# Patient Record
Sex: Male | Born: 1940 | Race: White | Hispanic: No | Marital: Married | Smoking: Former smoker
Health system: Southern US, Community
[De-identification: ages and names within clinical notes are randomized; demographics above are authoritative.]

## PROBLEM LIST (undated history)

## (undated) DIAGNOSIS — N189 Chronic kidney disease, unspecified: Secondary | ICD-10-CM

## (undated) DIAGNOSIS — I10 Essential (primary) hypertension: Secondary | ICD-10-CM

## (undated) DIAGNOSIS — J309 Allergic rhinitis, unspecified: Secondary | ICD-10-CM

## (undated) DIAGNOSIS — I2510 Atherosclerotic heart disease of native coronary artery without angina pectoris: Secondary | ICD-10-CM

## (undated) DIAGNOSIS — I4891 Unspecified atrial fibrillation: Secondary | ICD-10-CM

## (undated) HISTORY — DX: Unspecified atrial fibrillation: I48.91

## (undated) HISTORY — DX: Atherosclerotic heart disease of native coronary artery without angina pectoris: I25.10

## (undated) HISTORY — PX: AORTIC VALVE REPAIR: SHX6306

## (undated) HISTORY — DX: Essential (primary) hypertension: I10

## (undated) HISTORY — DX: Chronic kidney disease, unspecified: N18.9

## (undated) HISTORY — PX: CORONARY ARTERY BYPASS GRAFT: SHX141

---

## 2002-09-20 ENCOUNTER — Encounter: Admission: RE | Admit: 2002-09-20 | Discharge: 2002-09-20 | Payer: Self-pay | Admitting: Pulmonary Disease

## 2002-09-20 ENCOUNTER — Encounter: Payer: Self-pay | Admitting: Pulmonary Disease

## 2003-06-19 ENCOUNTER — Other Ambulatory Visit: Payer: Self-pay

## 2005-01-10 ENCOUNTER — Other Ambulatory Visit: Payer: Self-pay

## 2005-01-16 ENCOUNTER — Inpatient Hospital Stay: Payer: Self-pay | Admitting: Specialist

## 2005-01-22 ENCOUNTER — Encounter: Payer: Self-pay | Admitting: Internal Medicine

## 2005-01-23 ENCOUNTER — Ambulatory Visit: Payer: Self-pay | Admitting: Internal Medicine

## 2006-04-09 ENCOUNTER — Ambulatory Visit: Payer: Self-pay | Admitting: Specialist

## 2006-07-07 ENCOUNTER — Inpatient Hospital Stay (HOSPITAL_COMMUNITY): Admission: RE | Admit: 2006-07-07 | Discharge: 2006-07-08 | Payer: Self-pay | Admitting: Neurosurgery

## 2007-01-05 ENCOUNTER — Ambulatory Visit: Payer: Self-pay | Admitting: Internal Medicine

## 2007-03-04 ENCOUNTER — Ambulatory Visit: Payer: Self-pay | Admitting: Internal Medicine

## 2008-02-21 ENCOUNTER — Emergency Department: Payer: Self-pay | Admitting: Emergency Medicine

## 2008-02-24 ENCOUNTER — Ambulatory Visit: Payer: Self-pay | Admitting: Unknown Physician Specialty

## 2008-03-22 ENCOUNTER — Ambulatory Visit: Payer: Self-pay | Admitting: Unknown Physician Specialty

## 2008-04-05 ENCOUNTER — Ambulatory Visit: Payer: Self-pay | Admitting: Unknown Physician Specialty

## 2008-09-02 ENCOUNTER — Ambulatory Visit: Payer: Self-pay | Admitting: General Surgery

## 2009-02-19 ENCOUNTER — Ambulatory Visit: Payer: Self-pay | Admitting: Specialist

## 2009-05-29 ENCOUNTER — Ambulatory Visit: Payer: Self-pay | Admitting: Internal Medicine

## 2009-06-13 ENCOUNTER — Ambulatory Visit: Payer: Self-pay | Admitting: Specialist

## 2009-06-22 ENCOUNTER — Ambulatory Visit: Payer: Self-pay | Admitting: Specialist

## 2010-05-22 NOTE — Op Note (Signed)
NAME:  Terry Stephens, Terry Stephens NO.:  [id number]   MEDICAL RECORD NO.:  [id number]          PATIENT TYPE:  INP   LOCATION:  2899                         FACILITY:  [facility]   PHYSICIAN:  Kathaleen Maser. Pool, M.D.    DATE OF BIRTH:  1940/10/06   DATE OF PROCEDURE:  07/07/2006  DATE OF DISCHARGE:                               OPERATIVE REPORT   SERVICE:  Neurosurgery.   PREOPERATIVE DIAGNOSIS:  Right L5-S1 herniated pulposus with __________.   POSTOPERATIVE DIAGNOSIS:  Right L5-S1 herniated pulposus with  __________.   PROCEDURE NOTE:  Right L5-S1 laminotomy and microdiskectomy.   SURGEON:  Kathaleen Maser. Pool, M.D.   ASSISTANT:  Reinaldo Meeker, M.D.   ANESTHESIA:  General   PREOPERATIVE MEDICATION:  Terry Stephens is a 70 year old male with  history of a right-sided foot drop with paresthesias and numbness  consistent with a right-sided L5 radiculopathy.  Workup demonstrates  evidence of a foraminal disk herniation off to the right-side at L5-S1  with compression of the right-sided L5 nerve root.  The patient has  failed conservative management.  We discussed options of further  management including the possibility of undergoing a right-sided L5-S1  laminotomy via microscope in the hopes of improving his symptoms.  The  patient aware of the risks and benefits and wishes to proceed.   Operative note, the patient placed on the operating table in the supine  position after general anesthesia was achieved.  The patient was placed  prone onto the Wilson frame, properly padded the patient in the lumbar  regions and prepped and draped sterilely; a second incision overlying  the L5-S1 interspace.  This was carried sharply in the midline.  A  subperiosteal dissection performed to the lamina facet joints of L5-S1  on the right side.  A deep self-retainer is placed.  Intraoperative x-  rays taken and level was confirmed.   Laminotomy was then performed using high-speed drill and  Kerrison  rongeurs to the inferior aspect of the lamina of L5, medial aspect of L5-  S1 facet joint, and the superior aspect of the S1 __________ .  Ligament  flavum was then elevated and resected the fascia using Kerrison  rongeurs.  Underlying thecal sac was then identified.  The facet joints  and ligament and flavum were further undercut exposing the right side L5  nerve root.  Microscope was then brought into the field using  microdissection of the right side of L5 nerve root, and underlying disk  __________ .  Epidural venous plexus coagulated and cut.  Disk space  then incised with a 15-blade in a rectangular fashion.  A wide displaced  plane was then achieved using two pituitary rongeurs, self-retaining  rongeurs and Epstein curettes.  The disk herniation was completely  resected.  All loose or obviously degenerative disk material was removed  from the interspace.   At this point a very thorough decompression then performed.  Wound was  then irrigated with antibiotic solution.  Gelfoam was placed on the  operative incisions which were found to be good.  Microscope and  retractor system were  removed.  Hemostasis and __________ ; wound was closed in layers with  Vicryl suture.  Steri-Strips and a sterile dressing were applied.  There  were no operative complications.  The patient tolerated the procedure  well; and he returns to the recovery room postoperatively.           ______________________________  Kathaleen Maser Pool, M.D.     HAP/MEDQ  D:  07/07/2006  T:  07/07/2006  Job:  [id number]

## 2010-06-07 ENCOUNTER — Ambulatory Visit: Payer: Self-pay | Admitting: Internal Medicine

## 2010-10-17 DIAGNOSIS — I10 Essential (primary) hypertension: Secondary | ICD-10-CM | POA: Insufficient documentation

## 2010-10-17 DIAGNOSIS — N184 Chronic kidney disease, stage 4 (severe): Secondary | ICD-10-CM | POA: Insufficient documentation

## 2010-10-24 LAB — BASIC METABOLIC PANEL
BUN: 18
GFR calc Af Amer: 51 — ABNORMAL LOW
GFR calc non Af Amer: 43 — ABNORMAL LOW
Glucose, Bld: 85
Potassium: 4
Sodium: 141

## 2010-10-24 LAB — DIFFERENTIAL
Eosinophils Absolute: 0.2
Eosinophils Relative: 3
Lymphs Abs: 1.7
Monocytes Absolute: 0.8 — ABNORMAL HIGH

## 2010-10-24 LAB — CBC
HCT: 44.2
Hemoglobin: 15
MCHC: 33.8
RBC: 4.96
RDW: 14.2 — ABNORMAL HIGH
WBC: 5.9

## 2010-10-24 LAB — TYPE AND SCREEN
ABO/RH(D): O POS
Antibody Screen: NEGATIVE

## 2010-10-24 LAB — ABO/RH: ABO/RH(D): O POS

## 2010-11-04 ENCOUNTER — Emergency Department: Payer: Self-pay | Admitting: Emergency Medicine

## 2013-02-28 IMAGING — CT CT HEAD WITHOUT CONTRAST
2 series · 16 of 30 positions shown, 20 images · non-contrast
Comparison: none

REASON FOR EXAM: syncope
COMMENTS:

PROCEDURE:     CT  - CT HEAD WITHOUT CONTRAST  - November 04, 2010  [DATE]
RESULT:     History: Syncope.
Comparison Study: No prior.

[Series 2: without · axial · non-contrast · 0.46mm/px · z∈[-195,-65]mm · 13 of 32 slices shown, 17 images]
[im 3/32  brain]
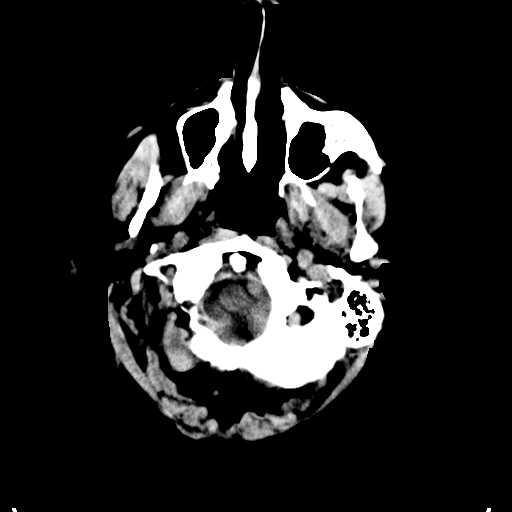
[im 3/32  bone]
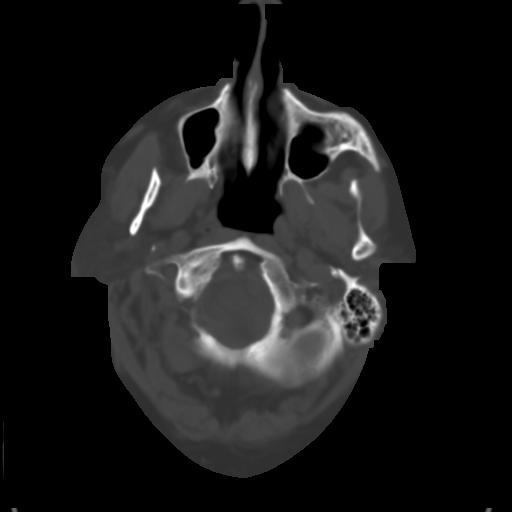
[im 5/32  brain]
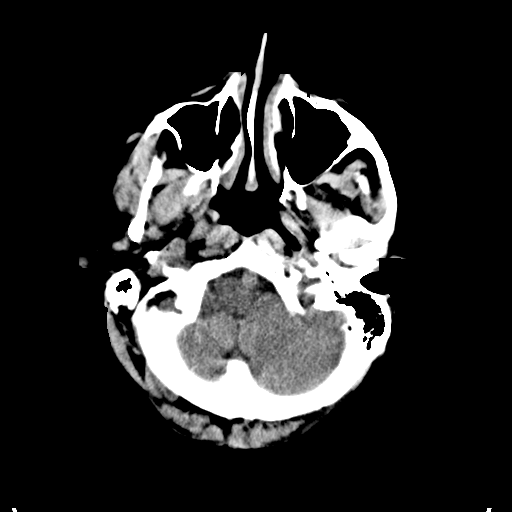
[im 7/32  brain]
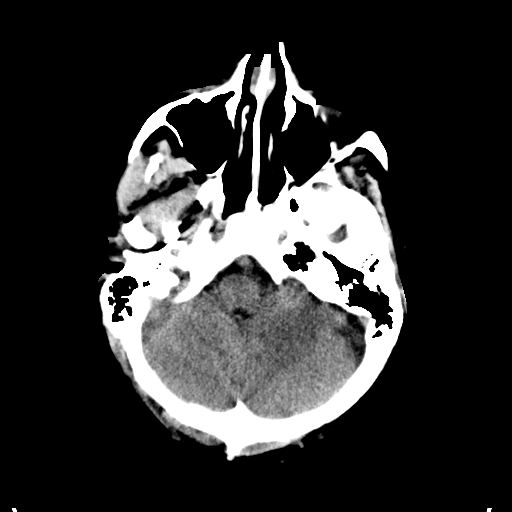
[im 9/32  brain]
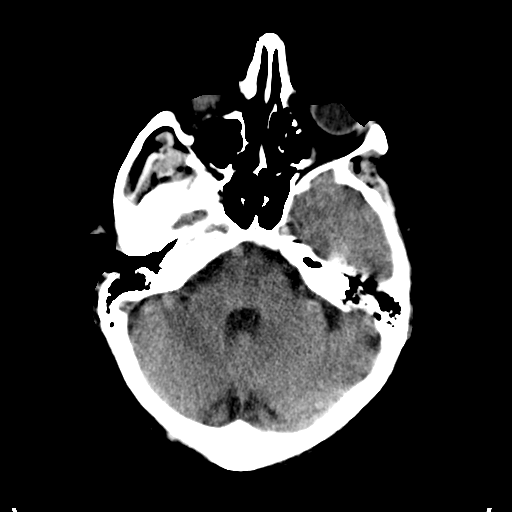
[im 12/32  brain]
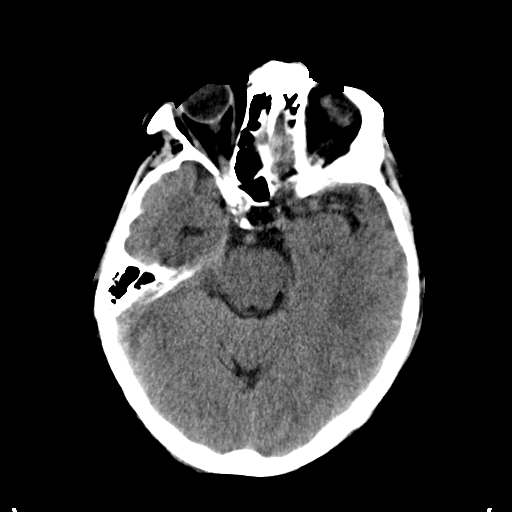
[im 12/32  bone]
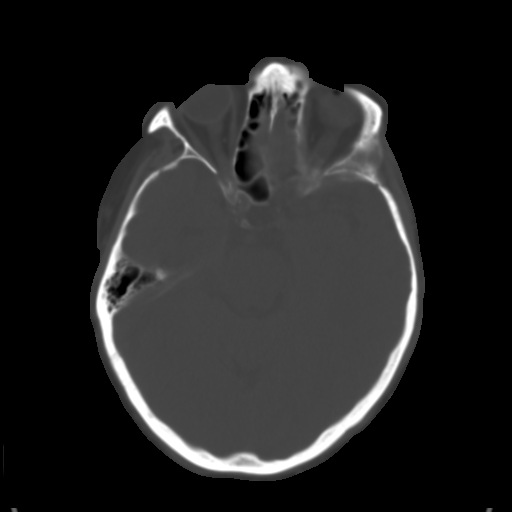
[im 14/32  brain]
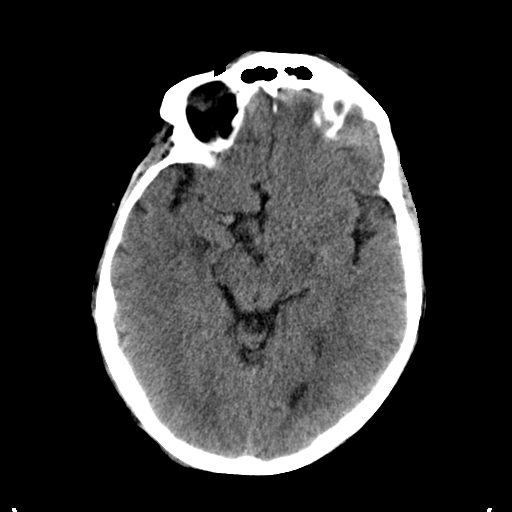
[im 16/32  brain]
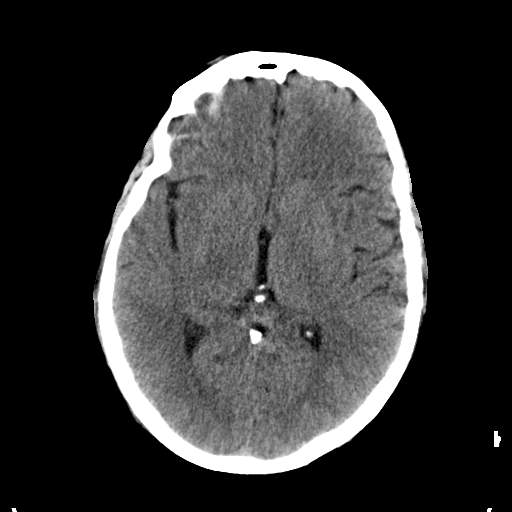
[im 18/32  brain]
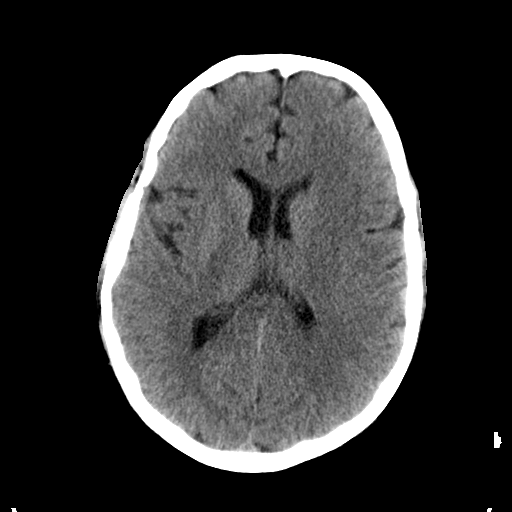
[im 20/32  brain]
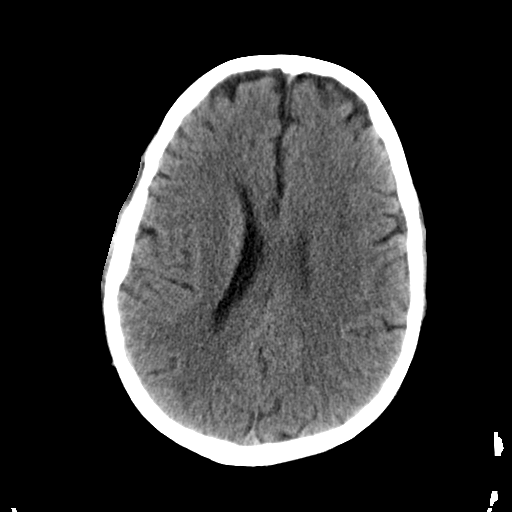
[im 20/32  bone]
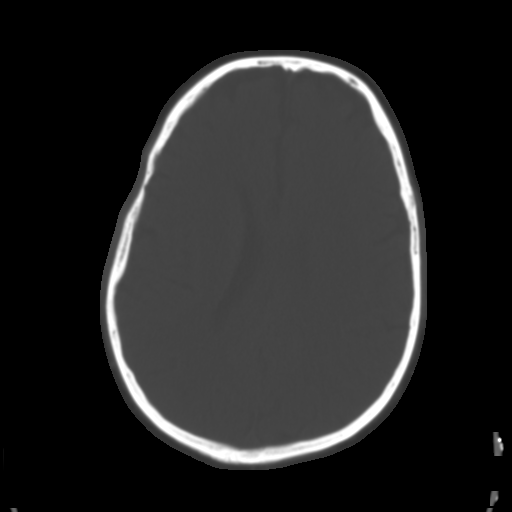
[im 23/32  brain]
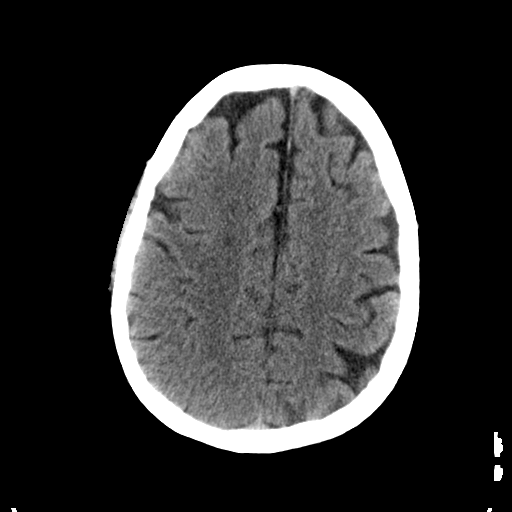
[im 25/32  brain]
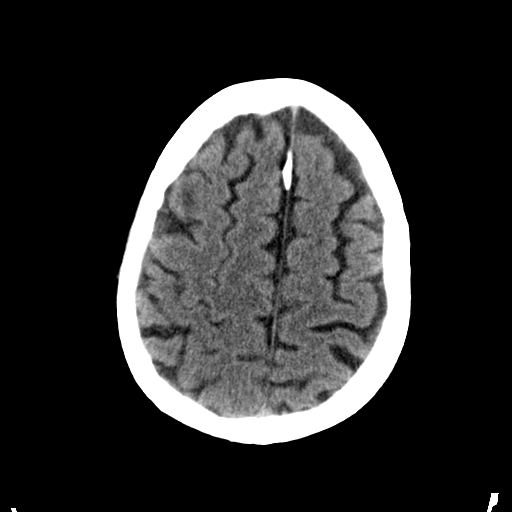
[im 27/32  brain]
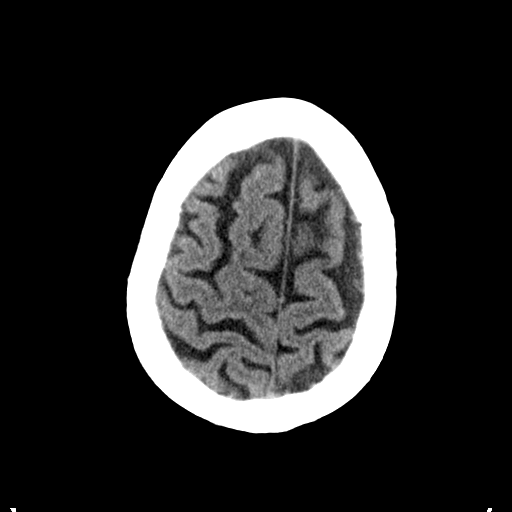
[im 29/32  brain]
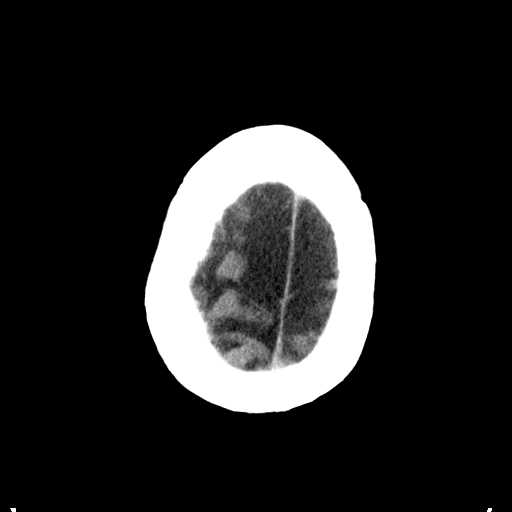
[im 29/32  bone]
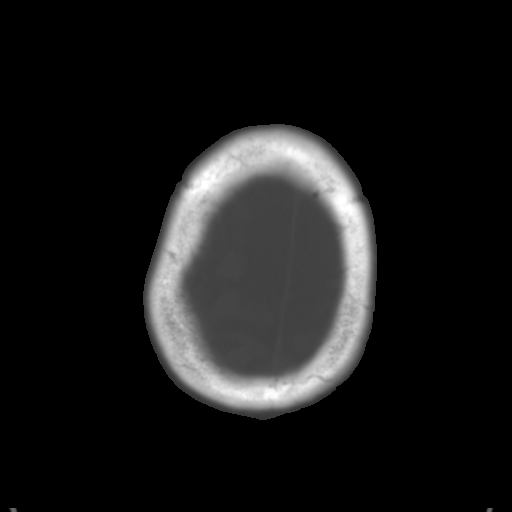

[Series 3: bone · axial · 0.46mm/px · z∈[-195,-150]mm · 3 of 32 slices shown]
[im 3/32  bone]
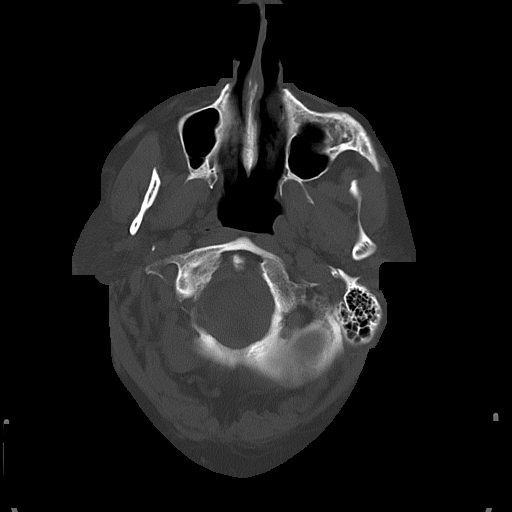
[im 7/32  bone]
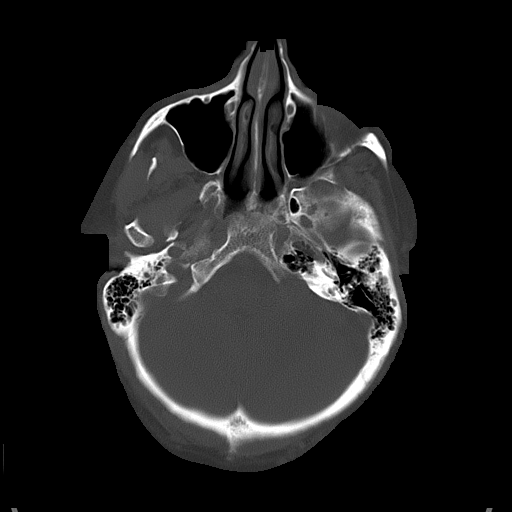
[im 12/32  bone]
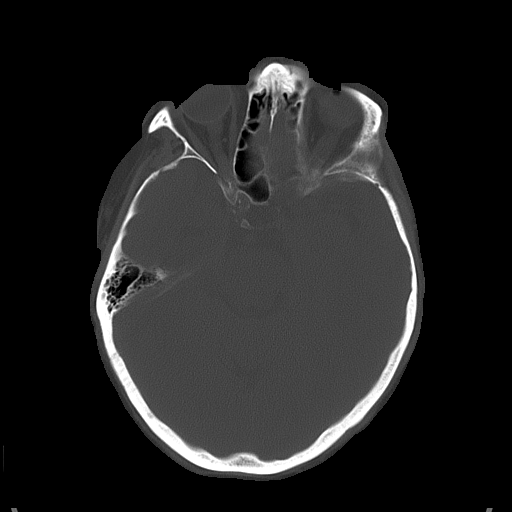

[16 of 30 positions shown; findings below may reference images not displayed]

FINDINGS: No mass. No hydrocephalus. No hemorrhage. White matter changes
noted consistent with chronic ischemia. No bony abnormality identified.
IMPRESSION: No acute abnormality.

## 2016-02-19 DIAGNOSIS — M25569 Pain in unspecified knee: Secondary | ICD-10-CM | POA: Insufficient documentation

## 2016-10-09 ENCOUNTER — Ambulatory Visit (INDEPENDENT_AMBULATORY_CARE_PROVIDER_SITE_OTHER): Payer: Medicare Other | Admitting: Podiatry

## 2016-10-09 ENCOUNTER — Encounter: Payer: Self-pay | Admitting: Podiatry

## 2016-10-09 VITALS — BP 141/88 | HR 74 | Temp 98.5°F | Resp 16

## 2016-10-09 DIAGNOSIS — L600 Ingrowing nail: Secondary | ICD-10-CM | POA: Diagnosis not present

## 2016-10-09 MED ORDER — NEOMYCIN-POLYMYXIN-HC 3.5-10000-1 OT SOLN: OTIC | 0 refills | Status: DC

## 2016-10-09 NOTE — Patient Instructions (Addendum)

## 2016-10-09 NOTE — Progress Notes (Signed)
Subjective:  Patient ID: Terry Malkin Sr., male    DOB: [birth year],  MRN: [id number] HPI No chief complaint on file.   76 y.o. male presents with the above complaint.   He presents today with a two-month history of pain to the  No past medical history on file. No past surgical history on file.  Current Outpatient Prescriptions:  .  Acetaminophen 500 MG coapsule, 500 mg  prn as needed, Disp: , Rfl:  .  aspirin 81 MG tablet, Take by mouth., Disp: , Rfl:  .  atorvastatin (LIPITOR) 20 MG tablet, Take by mouth., Disp: , Rfl:  .  fluticasone (FLONASE) 50 MCG/ACT nasal spray, Place into the nose., Disp: , Rfl:  .  furosemide (LASIX) 40 MG tablet, Take by mouth., Disp: , Rfl:  .  losartan (COZAAR) 50 MG tablet, Take by mouth., Disp: , Rfl:  .  tacrolimus (PROTOPIC) 0.1 % ointment, Apply topically., Disp: , Rfl:  .  amoxicillin (AMOXIL) 500 MG capsule, amoxicillin 500 mg capsule  TAKE 4 CAPSULES BY MOUTH 1 HOUR PRIOR TO APPT, Disp: , Rfl:  .  benzonatate (TESSALON) 100 MG capsule, benzonatate 100 mg capsule  TAKE 1 CAPSULE BY MOUTH EVERY 8 HOURS AS NEEDED FOR COUGH, Disp: , Rfl:  .  carvedilol (COREG) 12.5 MG tablet, Take 12.5 mg by mouth 2 (two) times daily with a meal., Disp: , Rfl: 11 .  Cholecalciferol (VITAMIN D3) 2000 units capsule, Take by mouth., Disp: , Rfl:  .  metroNIDAZOLE (FLAGYL) 500 MG tablet, metronidazole 500 mg tablet  TAKE 1 TABLET BY MOUTH TWICE A DAY AT 12 NOON AND 12 AM, Disp: , Rfl:  .  psyllium (METAMUCIL) 0.52 g capsule, Metamucil, Disp: , Rfl:   Allergies  Allergen Reactions  . Prochlorperazine Edisylate    Review of Systems  All other systems reviewed and are negative.  Objective:   Vitals:   10/09/16 1015  Resp: 16  Temp: 98.5 F (36.9 C)    General: Well developed, nourished, in no acute distress, alert and oriented x3   Dermatological: Skin is warm, dry and supple bilateral. Nails x 10 are well maintained; remaining integument appears  unremarkable at this time. There are no open sores, no preulcerative lesions, no rash or signs of infection present. Sharply incurvated nail margin along the fibular border hallux left mild erythema no purulence or malodor.  Vascular: Dorsalis Pedis artery and Posterior Tibial artery pedal pulses are 2/4 bilateral with immedate capillary fill time. Pedal hair growth present. No varicosities and no lower extremity edema present bilateral.   Neruologic: Grossly intact via light touch bilateral. Vibratory intact via tuning fork bilateral. Protective threshold with Semmes Wienstein monofilament intact to all pedal sites bilateral. Patellar and Achilles deep tendon reflexes 2+ bilateral. No Babinski or clonus noted bilateral.   Musculoskeletal: No gross boney pedal deformities bilateral. No pain, crepitus, or limitation noted with foot and ankle range of motion bilateral. Muscular strength 5/5 in all groups tested bilateral.  Gait: Unassisted, Nonantalgic.    Radiographs:  None taken  Assessment & Plan:   Assessment: Ingrown nail hallux left.  Plan: Perform chemical matrixectomy fibular border hallux left today. He tolerated the procedure well after local anesthesia was administered. He was provided with both oral and written home-going instructions for care and soaking of his toe. We also provided him with a prescription for Cortisporin Otic to be applied twice daily after soaking. We will follow-up with him in 2 weeks  Garrel Ridgel, DPM

## 2016-10-23 ENCOUNTER — Ambulatory Visit (INDEPENDENT_AMBULATORY_CARE_PROVIDER_SITE_OTHER): Payer: Medicare Other | Admitting: Podiatry

## 2016-10-23 ENCOUNTER — Encounter: Payer: Self-pay | Admitting: Podiatry

## 2016-10-23 DIAGNOSIS — L600 Ingrowing nail: Secondary | ICD-10-CM

## 2016-10-23 NOTE — Patient Instructions (Signed)

## 2016-10-23 NOTE — Progress Notes (Signed)
He presents today for matrixectomy hallux left. States is doing just fine.  Objective: Vital signs are stable alert and oriented 3. No erythema edema cellulitis drainage or odor.  Assessment: Healing surgical foot hallux left.  Plan: Recommend he continue to soak it out of an abundance of caution for the next week or so one time daily cover with a Band-Aid during the daytime and leave open at night time. He will soak in Epsom salts and warm water.

## 2020-06-06 ENCOUNTER — Ambulatory Visit
Admission: RE | Admit: 2020-06-06 | Discharge: 2020-06-06 | Disposition: A | Discharge status: Home / Self Care | Payer: Medicare Other | Attending: Internal Medicine | Admitting: Internal Medicine

## 2020-06-06 ENCOUNTER — Other Ambulatory Visit: Payer: Self-pay

## 2020-06-06 ENCOUNTER — Other Ambulatory Visit: Payer: Self-pay | Admitting: Internal Medicine

## 2020-06-06 ENCOUNTER — Ambulatory Visit
Admission: RE | Admit: 2020-06-06 | Discharge: 2020-06-06 | Disposition: A | Discharge status: Home / Self Care | Payer: Medicare Other | Source: Ambulatory Visit | Attending: Internal Medicine | Admitting: Internal Medicine

## 2020-06-06 DIAGNOSIS — R059 Cough, unspecified: Secondary | ICD-10-CM | POA: Diagnosis not present

## 2022-02-07 HISTORY — PX: AV FISTULA PLACEMENT: SHX1204

## 2022-03-20 ENCOUNTER — Other Ambulatory Visit: Payer: Self-pay

## 2022-03-20 ENCOUNTER — Emergency Department
Admission: EM | Admit: 2022-03-20 | Discharge: 2022-03-20 | Disposition: A | Discharge status: Home / Self Care | Payer: Medicare Other | Attending: Emergency Medicine | Admitting: Emergency Medicine

## 2022-03-20 ENCOUNTER — Emergency Department: Payer: Medicare Other

## 2022-03-20 DIAGNOSIS — R600 Localized edema: Secondary | ICD-10-CM

## 2022-03-20 DIAGNOSIS — N189 Chronic kidney disease, unspecified: Secondary | ICD-10-CM | POA: Insufficient documentation

## 2022-03-20 LAB — TROPONIN I (HIGH SENSITIVITY)
Troponin I (High Sensitivity): 13 ng/L (ref <18)
Troponin I (High Sensitivity): 13 ng/L (ref <18)

## 2022-03-20 LAB — CBC
HCT: 32.7 % — ABNORMAL LOW (ref 39.0–52.0)
Hemoglobin: 10.1 g/dL — ABNORMAL LOW (ref 13.0–17.0)
MCH: 28.9 pg (ref 26.0–34.0)
MCHC: 30.9 g/dL (ref 30.0–36.0)
MCV: 93.7 fL (ref 80.0–100.0)
Platelets: 234 10*3/uL (ref 150–400)
RBC: 3.49 MIL/uL — ABNORMAL LOW (ref 4.22–5.81)
RDW: 14.4 % (ref 11.5–15.5)
WBC: 5 10*3/uL (ref 4.0–10.5)
nRBC: 0 % (ref 0.0–0.2)

## 2022-03-20 LAB — COMPREHENSIVE METABOLIC PANEL
ALT: 19 U/L (ref 0–44)
AST: 15 U/L (ref 15–41)
Albumin: 3.2 g/dL — ABNORMAL LOW (ref 3.5–5.0)
Alkaline Phosphatase: 67 U/L (ref 38–126)
Anion gap: 7 (ref 5–15)
BUN: 90 mg/dL — ABNORMAL HIGH (ref 8–23)
CO2: 21 mmol/L — ABNORMAL LOW (ref 22–32)
Calcium: 8.5 mg/dL — ABNORMAL LOW (ref 8.9–10.3)
Chloride: 111 mmol/L (ref 98–111)
Creatinine, Ser: 4.68 mg/dL — ABNORMAL HIGH (ref 0.61–1.24)
GFR, Estimated: 12 mL/min — ABNORMAL LOW (ref >60)
Glucose, Bld: 138 mg/dL — ABNORMAL HIGH (ref 70–99)
Potassium: 4.5 mmol/L (ref 3.5–5.1)
Sodium: 139 mmol/L (ref 135–145)
Total Bilirubin: 0.5 mg/dL (ref 0.3–1.2)
Total Protein: 5.8 g/dL — ABNORMAL LOW (ref 6.5–8.1)

## 2022-03-20 LAB — BRAIN NATRIURETIC PEPTIDE: B Natriuretic Peptide: 749.2 pg/mL — ABNORMAL HIGH (ref 0.0–100.0)

## 2022-03-20 MED ORDER — FUROSEMIDE 80 MG PO TABS: 80.0000 mg | ORAL_TABLET | Freq: Two times a day (BID) | ORAL | 0 refills | Status: DC

## 2022-03-20 NOTE — ED Provider Notes (Signed)
[facility] Provider Note   Event Date/Time   First MD Initiated Contact with Patient 03/20/22 1526     (approximate) History  Leg Swelling  HPI Terry Stephens Sr. is a 82 y.o. male with a stated past medical history of chronic renal disease recently with AV fistula placed on 03/15/2022 who presents complaining of bilateral lower extremity edema that has been worsening despite increasing Lasix dosing.  Patient also endorses exercise intolerance with dyspnea on exertion over this time as well. ROS: Patient currently denies any vision changes, tinnitus, difficulty speaking, facial droop, sore throat, chest pain, abdominal pain, nausea/vomiting/diarrhea, dysuria, or numbness/paresthesias in any extremity   Physical Exam  Triage Vital Signs: ED Triage Vitals  Enc Vitals Group     BP 03/20/22 1207 125/71     Pulse Rate 03/20/22 1207 63     Resp 03/20/22 1207 16     Temp 03/20/22 1207 97.7 F (36.5 C)     Temp src --      SpO2 03/20/22 1207 100 %     Weight 03/20/22 1215 199 lb (90.3 kg)     Height 03/20/22 1215 '5\' 9"'$  (1.753 m)     Head Circumference --      Peak Flow --      Pain Score 03/20/22 1221 0     Pain Loc --      Pain Edu? --      Excl. in [place]? --    Most recent vital signs: Vitals:   03/20/22 1600 03/20/22 1630  BP: (!) 150/84 (!) 142/92  Pulse: 85 89  Resp: 16 17  Temp:    SpO2: 99% 98%   General: Awake, oriented x4. CV:  Good peripheral perfusion.  Resp:  Normal effort.  Abd:  No distention.  Other:  Elderly overweight Caucasian male laying in bed in no acute distress.  2+ pitting edema to bilateral lower extremities ED Results / Procedures / Treatments  Labs (all labs ordered are listed, but only abnormal results are displayed) Labs Reviewed  CBC - Abnormal; Notable for the following components:      Result Value   RBC 3.49 (*)    Hemoglobin 10.1 (*)    HCT 32.7 (*)    All other components within normal limits  COMPREHENSIVE  METABOLIC PANEL - Abnormal; Notable for the following components:   CO2 21 (*)    Glucose, Bld 138 (*)    BUN 90 (*)    Creatinine, Ser 4.68 (*)    Calcium 8.5 (*)    Total Protein 5.8 (*)    Albumin 3.2 (*)    GFR, Estimated 12 (*)    All other components within normal limits  BRAIN NATRIURETIC PEPTIDE - Abnormal; Notable for the following components:   B Natriuretic Peptide 749.2 (*)    All other components within normal limits  TROPONIN I (HIGH SENSITIVITY)  TROPONIN I (HIGH SENSITIVITY)   EKG ED ECG REPORT I, Naaman Plummer, the attending physician, personally viewed and interpreted this ECG. Date: 03/20/2022 EKG Time: 1234 Rate: 67 Rhythm: Atrial fibrillation QRS Axis: normal Intervals: normal ST/T Wave abnormalities: normal Narrative Interpretation: Atrial fibrillation.  No evidence of acute ischemia RADIOLOGY ED MD interpretation: One-view portable chest x-ray interpreted by me shows no evidence of pneumonia, pneumothorax, or widened mediastinum -Agree with radiology assessment.  There is evidence of possible small bilateral pleural effusions with no edema or confluent airspace opacities.  There is mildly increased bibasilar atelectasis  Official radiology report(s): DG Chest Port 1 View  Result Date: 03/20/2022 CLINICAL DATA:  Shortness of breath with leg swelling for the past week. EXAM: PORTABLE CHEST 1 VIEW COMPARISON:  Radiographs 06/06/2020 and 11/04/2010.  CT 01/23/2005. FINDINGS: 1325 hours. 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 Mildly increased linear opacities at both lung bases, likely reflecting atelectasis or scarring. There may be a small amount of pleural fluid bilaterally. No edema, confluent airspace opacity or pneumothorax. Thoracotomy changes are present on the right. No acute osseous findings are seen. IMPRESSION: Mildly increased bibasilar atelectasis or scarring. Possible small bilateral pleural effusions. No edema or  confluent airspace opacity. Electronically Signed   By: Richardean Sale M.D.   On: 03/20/2022 13:38   PROCEDURES: Critical Care performed: No .1-3 Lead EKG Interpretation  Performed by: Naaman Plummer, MD Authorized by: Naaman Plummer, MD     Interpretation: normal     ECG rate:  71   ECG rate assessment: normal     Rhythm: sinus rhythm     Ectopy: none     Conduction: normal    MEDICATIONS ORDERED IN ED: Medications - No data to display IMPRESSION / MDM / [place] / ED COURSE  I reviewed the triage vital signs and the nursing notes.                             The patient is on the cardiac monitor to evaluate for evidence of arrhythmia and/or significant heart rate changes. Patient's presentation is most consistent with acute presentation with potential threat to life or bodily function. Endorses dyspnea, endorses LE edema Denies Non adherence to medication regimen  Workup: ECG, CBC, BMP, Troponin, BNP, CXR Findings: EKG: No STEMI and no evidence of Brugadas sign, delta wave, epsilon wave, significantly prolonged QTc, or malignant arrhythmia. 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 Low suspicion for PNA, ACS, tamponade, aortic dissection. Interventions: Diuresis  Reassessment: Symptoms improved in ED with diuresis  Disposition (Stable): Discharge    FINAL CLINICAL IMPRESSION(S) / ED DIAGNOSES   Final diagnoses:  Bilateral lower extremity edema   Rx / DC Orders   ED Discharge Orders          Ordered    furosemide (LASIX) 80 MG tablet  2 times daily        03/20/22 1650           Note:  This document was prepared using Dragon voice recognition software and may include unintentional dictation errors.   Naaman Plummer, MD 03/20/22 [phone]

## 2022-03-20 NOTE — ED Notes (Signed)
[facility]  transfer  center  called per  Dr  Cheri Fowler MD

## 2022-03-20 NOTE — ED Triage Notes (Addendum)
Pt states that his legs and ankles have been swelling for the past week, pt states that he has doubled his furosemide for the past 4 days taking '40mg'$  a day instead of '20mg'$ . Pt states that he hasn't noticed a difference, states that the left is worse than the right. Reports increased sob since the 10th of feb. 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

## 2022-03-20 NOTE — ED Notes (Signed)
Pt verbalizes understanding of discharge instructions. Opportunity for questioning and answers were provided. Pt discharged from ED to home with significant other.   ? ?

## 2022-03-27 ENCOUNTER — Other Ambulatory Visit (INDEPENDENT_AMBULATORY_CARE_PROVIDER_SITE_OTHER): Payer: Self-pay | Admitting: Physician Assistant

## 2022-03-27 DIAGNOSIS — N186 End stage renal disease: Secondary | ICD-10-CM

## 2022-03-27 DIAGNOSIS — Z9889 Other specified postprocedural states: Secondary | ICD-10-CM

## 2022-04-01 ENCOUNTER — Ambulatory Visit (INDEPENDENT_AMBULATORY_CARE_PROVIDER_SITE_OTHER): Payer: Medicare Other

## 2022-04-01 DIAGNOSIS — Z9889 Other specified postprocedural states: Secondary | ICD-10-CM

## 2022-04-01 DIAGNOSIS — N186 End stage renal disease: Secondary | ICD-10-CM | POA: Diagnosis not present

## 2022-05-21 ENCOUNTER — Encounter: Payer: Self-pay | Admitting: *Deleted

## 2022-05-28 NOTE — Progress Notes (Unsigned)
Cardiology Office Note  Date:  05/29/2022   ID:  Terry Malkin Sr., DOB 06-14-1940, MRN [id number]  PCP:  Terry Shaggy, MD   Chief Complaint  Patient presents with   Establish Care    Afib, CKD, CAD (CABG), HTN,     HPI:  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 Aortic Insufficiency: S/P AVR / S/P AVR (Bioprosthetic) 2012 CKD- Stage 5 / ESRD / Anemia of chronic disease Hyperlipidemia: Statin History of vasovagal syncope related to blood draws Who presents to establish care for his coronary disease, atrial fibrillation  Previously [facility] cardiology EP, difficulty [facility] to establish locally in [place]  Currently lives in a condo with his wife, hobbies include some gardening, reading, house activities No regular exercise program Denies any significant tachypalpitations concerning for arrhythmia, Denies orthostasis symptoms Recent AV graft fistula placed on left arm Not receiving hemodialysis but is prepared if needed No chest pain or shortness of breath concerning for angina  Bursitis in heel, [facility] Started on prednisone  Labs reviewed CR 5.5   Cardiac imaging/testing as detailed below Echocardiogram: 10/14/21 MILD LV DYSFUNCTION (See above) WITH MILD LVH MILD RV SYSTOLIC DYSFUNCTION (See above) VALVULAR REGURGITATION: TRIVIAL AR, MILD MR, MILD PR,  MODERATE TR PROSTHETIC VALVE(S): BIOPROSTHETIC AoV EF 45% LA DIAM 4.2 CM  Echo 2/24 Normal left ventricular size with reduced systolic function. LVEF 40%.  Enlarged right ventricular size with reduced function.  Normal function bioprosthetic aortic valve  Mild mitral  regurgitation.   3-Day Holter Monitor: 04/24/21 This 48 hour Holter scan was adequate for interpretation. The patient was in sinus rhythm, sinus bradycardia, with first degree AV block and bundle branch noted throughout (strip 12) Ventricular ectopic activity consisted of multifocal PVCs (strips 6,10), couplets (strip 13), triplet (strip 9) Unable to accurately quantitate supraventricular ectopy due to program setting, however there were PACs (strip 8), atrial pairs (strip 5), bigeminy (strip 7), trigeminy (strip 11) with runs of SVT. The longest was a 37 beat, 91 BPM atrial run at 4:34:15 AM (2) (strips 15,16). The fastest run was 3 beats, 131 BPM at 4:50:40 PM (2) (strip 14) There were no pauses greater than 2.0 seconds noted There were no symptoms noted in diary nor was the event button activated.  EKG personally reviewed by myself on todays visit Normal sinus rhythm with rate 68 bpm right bundle branch block consider old inferior MI   PMH:   has a past medical history of A-fib (HCC), CAD (coronary artery disease), Chronic kidney disease, and Hypertension.  PSH:    Past Surgical History:  Procedure Laterality Date   AORTIC VALVE REPAIR     CORONARY ARTERY BYPASS GRAFT      Current Outpatient Medications  Medication Sig Dispense Refill   Acetaminophen 500 MG coapsule 500 mg  prn as needed     benzonatate (TESSALON) 100 MG capsule benzonatate 100 mg capsule  TAKE 1 CAPSULE BY MOUTH EVERY 8 HOURS AS NEEDED FOR COUGH     Cholecalciferol (VITAMIN D3) 2000 units capsule  Take by mouth.     diclofenac Sodium (VOLTAREN) 1 % GEL Apply topically 4 (four) times daily as needed.     esomeprazole (NEXIUM) 40 MG capsule Take  40 mg by mouth as needed.     fluticasone (FLONASE) 50 MCG/ACT nasal spray Place into the nose.     methylPREDNISolone (MEDROL) 4 MG tablet Take 4 mg by mouth daily.     metroNIDAZOLE (FLAGYL) 500 MG tablet metronidazole 500 mg tablet  TAKE 1 TABLET BY MOUTH TWICE A DAY AT 12 NOON AND 12 AM     neomycin-polymyxin-hydrocortisone (CORTISPORIN) OTIC solution Apply one to two drops to toe after soaking twice daily. 10 mL 0   psyllium (METAMUCIL) 0.52 g capsule Metamucil     Saw Palmetto 450 MG CAPS Take 450 mg by mouth daily.     sodium bicarbonate 650 MG tablet Take 650 mg by mouth 2 (two) times daily.     tacrolimus (PROTOPIC) 0.1 % ointment Apply topically.     tamsulosin (FLOMAX) 0.4 MG CAPS capsule Take 0.4 mg by mouth daily after supper.     amoxicillin (AMOXIL) 500 MG capsule amoxicillin 500 mg capsule  TAKE 4 CAPSULES BY MOUTH 1 HOUR PRIOR TO APPT (Patient not taking: Reported on 05/29/2022)     atorvastatin (LIPITOR) 20 MG tablet Take 1 tablet (20 mg total) by mouth daily. 90 tablet 3   ELIQUIS 2.5 MG TABS tablet Take 1 tablet (2.5 mg total) by mouth 2 (two) times daily. 180 tablet 3   furosemide (LASIX) 40 MG tablet Take 1 tablet (40 mg total) by mouth 2 (two) times daily. 180 tablet 3   losartan (COZAAR) 50 MG tablet Take 1 tablet (50 mg total) by mouth daily. 90 tablet 3   metoprolol succinate (TOPROL-XL) 100 MG 24 hr tablet Take 1 tablet (100 mg total) by mouth 2 (two) times daily. Take with or immediately following a meal. 180 tablet 3   No current facility-administered medications for this visit.     Allergies:   Phenothiazines and Prochlorperazine edisylate   Social History:  The patient  reports that he has quit smoking. He has never used smokeless tobacco. He reports that he does not drink alcohol and does not use drugs.   Family History:   family history includes Heart attack in his brother, father, and sister.    Review of Systems: Review of Systems  Constitutional:  Negative.   HENT: Negative.    Respiratory: Negative.    Cardiovascular: Negative.   Gastrointestinal: Negative.   Musculoskeletal: Negative.   Neurological: Negative.   Psychiatric/Behavioral: Negative.    All other systems reviewed and are negative.   PHYSICAL EXAM: VS:  BP 125/65 (BP Location: Right Arm, Patient Position: Sitting)   Pulse 68   Ht 5\' 9"  (1.753 m)   Wt 188 lb 9.6 oz (85.5 kg)   SpO2 98%   BMI 27.85 kg/m  , BMI Body mass index is 27.85 kg/m. GEN: Well nourished, well developed, in no acute distress HEENT: normal Neck: no JVD, carotid bruits, or masses Cardiac: RRR; 1-2 systolic ejection murmur right sternal border, no  rubs, or gallops,no edema  Respiratory:  clear to auscultation bilaterally, normal work of breathing GI: soft, nontender, nondistended, + BS MS: no deformity or atrophy Skin: warm and dry, no rash Neuro:  Strength and sensation are intact Psych: euthymic mood, full affect   Recent Labs: 03/20/2022: ALT 19; B Natriuretic Peptide 749.2; BUN 90; Creatinine, Ser 4.68; Hemoglobin 10.1;  Platelets 234; Potassium 4.5; Sodium 139    Lipid Panel No results found for: "CHOL", "HDL", "LDLCALC", "TRIG"    Wt Readings from Last 3 Encounters:  05/29/22 188 lb 9.6 oz (85.5 kg)  03/20/22 199 lb (90.3 kg)       ASSESSMENT AND PLAN:  Problem List Items Addressed This Visit       Cardiology Problems   Hypertension - Primary   Relevant Medications   metoprolol succinate (TOPROL-XL) 100 MG 24 hr tablet   atorvastatin (LIPITOR) 20 MG tablet   ELIQUIS 2.5 MG TABS tablet   furosemide (LASIX) 40 MG tablet   losartan (COZAAR) 50 MG tablet     Other   CKD (chronic kidney disease) stage 4, GFR 15-29 ml/min (HCC)   Other Visit Diagnoses     Coronary artery disease of native artery of native heart with stable angina pectoris (HCC)       Relevant Medications   methylPREDNISolone (MEDROL) 4 MG tablet   metoprolol succinate (TOPROL-XL) 100 MG 24 hr  tablet   atorvastatin (LIPITOR) 20 MG tablet   ELIQUIS 2.5 MG TABS tablet   furosemide (LASIX) 40 MG tablet   losartan (COZAAR) 50 MG tablet   Other Relevant Orders   EKG 12-Lead   S/P AVR (aortic valve replacement)       Relevant Orders   EKG 12-Lead   PAF (paroxysmal atrial fibrillation) (HCC)       Relevant Medications   metoprolol succinate (TOPROL-XL) 100 MG 24 hr tablet   atorvastatin (LIPITOR) 20 MG tablet   ELIQUIS 2.5 MG TABS tablet   furosemide (LASIX) 40 MG tablet   losartan (COZAAR) 50 MG tablet   Other Relevant Orders   EKG 12-Lead   PAC (premature atrial contraction)       Relevant Medications   metoprolol succinate (TOPROL-XL) 100 MG 24 hr tablet   atorvastatin (LIPITOR) 20 MG tablet   ELIQUIS 2.5 MG TABS tablet   furosemide (LASIX) 40 MG tablet   losartan (COZAAR) 50 MG tablet   Mixed hyperlipidemia       Relevant Medications   metoprolol succinate (TOPROL-XL) 100 MG 24 hr tablet   atorvastatin (LIPITOR) 20 MG tablet   ELIQUIS 2.5 MG TABS tablet   furosemide (LASIX) 40 MG tablet   losartan (COZAAR) 50 MG tablet   SVT (supraventricular tachycardia)       Relevant Medications   metoprolol succinate (TOPROL-XL) 100 MG 24 hr tablet   atorvastatin (LIPITOR) 20 MG tablet   ELIQUIS 2.5 MG TABS tablet   furosemide (LASIX) 40 MG tablet   losartan (COZAAR) 50 MG tablet      Coronary artery disease with stable angina Currently with no symptoms of angina. No further workup at this time. Continue current medication regimen.  S/p AVR Recent echocardiogram fibber 2024 stable bio prosthetic valve  Paroxysmal atrial fibrillation On reduced dose Eliquis, metoprolol succinate 100 twice daily, maintaining normal sinus rhythm  SVT Rhythm controlled with metoprolol succinate 100 twice daily  Near syncope/syncope Notes indicating history of vasovagal syncope Prior episodes at church Recommend holding his metoprolol, losartan, tamsulosin prior to performing in church  choir, would take these medications after he gets home with food  Hyperlipidemia Continue Lipitor 20 Goal LDL less than 70  End-stage renal disease Has fistula on left, not participating in dialysis   Total encounter time more than 60 minutes  Greater than 50% was spent in counseling and coordination of care with the patient  Signed, Dossie Arbour, M.D., Ph.D. [facility] [place], [place] [phone]

## 2022-05-29 ENCOUNTER — Ambulatory Visit: Payer: Medicare Other | Attending: Cardiovascular Disease | Admitting: Cardiovascular Disease

## 2022-05-29 ENCOUNTER — Encounter: Payer: Self-pay | Admitting: Cardiovascular Disease

## 2022-05-29 VITALS — BP 125/65 | HR 68 | Ht 69.0 in | Wt 188.6 lb

## 2022-05-29 DIAGNOSIS — I471 Supraventricular tachycardia, unspecified: Secondary | ICD-10-CM

## 2022-05-29 DIAGNOSIS — N184 Chronic kidney disease, stage 4 (severe): Secondary | ICD-10-CM

## 2022-05-29 DIAGNOSIS — E782 Mixed hyperlipidemia: Secondary | ICD-10-CM

## 2022-05-29 DIAGNOSIS — I25118 Atherosclerotic heart disease of native coronary artery with other forms of angina pectoris: Secondary | ICD-10-CM

## 2022-05-29 DIAGNOSIS — I480 Paroxysmal atrial fibrillation: Secondary | ICD-10-CM

## 2022-05-29 DIAGNOSIS — I491 Atrial premature depolarization: Secondary | ICD-10-CM

## 2022-05-29 DIAGNOSIS — I10 Essential (primary) hypertension: Secondary | ICD-10-CM | POA: Diagnosis not present

## 2022-05-29 DIAGNOSIS — Z952 Presence of prosthetic heart valve: Secondary | ICD-10-CM | POA: Diagnosis not present

## 2022-05-29 MED ORDER — METOPROLOL SUCCINATE ER 100 MG PO TB24: 100.0000 mg | ORAL_TABLET | Freq: Two times a day (BID) | ORAL | 3 refills | Status: DC

## 2022-05-29 MED ORDER — ATORVASTATIN CALCIUM 20 MG PO TABS: 20.0000 mg | ORAL_TABLET | Freq: Every day | ORAL | 3 refills | Status: DC

## 2022-05-29 MED ORDER — ELIQUIS 2.5 MG PO TABS: 2.5000 mg | ORAL_TABLET | Freq: Two times a day (BID) | ORAL | 3 refills | Status: DC

## 2022-05-29 MED ORDER — FUROSEMIDE 40 MG PO TABS: 40.0000 mg | ORAL_TABLET | Freq: Two times a day (BID) | ORAL | 3 refills | Status: DC

## 2022-05-29 MED ORDER — LOSARTAN POTASSIUM 50 MG PO TABS: 50.0000 mg | ORAL_TABLET | Freq: Every day | ORAL | 3 refills | Status: DC

## 2022-05-29 NOTE — Patient Instructions (Signed)
Medication Instructions:  No changes  If you need a refill on your cardiac medications before your next appointment, please call your pharmacy.    Lab work: No new labs needed   Testing/Procedures: No new testing needed   Follow-Up: At [facility], you and your health needs are our priority.  As part of our continuing mission to provide you with exceptional heart care, we have created designated Provider Care Teams.  These Care Teams include your primary Cardiologist (physician) and Advanced Practice Providers (APPs -  Physician Assistants and Nurse Practitioners) who all work together to provide you with the care you need, when you need it.  You will need a follow up appointment in 6 months  Providers on your designated Care Team:   Christopher Berge, NP Ryan Dunn, PA-C Cadence Furth, PA-C  COVID-19 Vaccine Information can be found at: [email or web address] For questions related to vaccine distribution or appointments, please email [email or web address] or call [phone].   

## 2022-06-25 ENCOUNTER — Telehealth: Payer: Self-pay | Admitting: Cardiovascular Disease

## 2022-06-25 DIAGNOSIS — D649 Anemia, unspecified: Secondary | ICD-10-CM

## 2022-06-25 NOTE — Telephone Encounter (Signed)
Pt c/o medication issue:  1. Name of Medication:   ELIQUIS 2.5 MG TABS tablet   2. How are you currently taking this medication (dosage and times per day)?   As prescribed  3. Are you having a reaction (difficulty breathing--STAT)?   No  4. What is your medication issue?   Patient called to report his nephrologist told him there was a drop in his hemoglobin and they believe it is attributed to this medication.

## 2022-07-02 NOTE — Telephone Encounter (Signed)
Called and spoke with patient. Informed patient of the following from Dr. Mariah Milling.   Appears to have history of chronic anemia, hemoglobin of 10 with slight decrease recently  We will probably recommend he talk with Dr. Arlana Pouch and we can place referral to hematology  Has he had any evidence of blood loss, including hematuria or GI bleeding with dark stools  Dr. Arlana Pouch may be able to help check these  After workup, nephrology will need to weigh in whether Epogen/Procrit is needed for further drop in blood count  Thx  TGollan   Patient verbalizes understanding. Order placed for Hematology.

## 2022-07-09 ENCOUNTER — Inpatient Hospital Stay: Payer: Medicare Other | Admitting: Licensed Clinical Social Worker

## 2022-07-09 ENCOUNTER — Encounter: Payer: Self-pay | Admitting: Oncology

## 2022-07-09 ENCOUNTER — Inpatient Hospital Stay: Payer: Medicare Other | Attending: Oncology | Admitting: Oncology

## 2022-07-09 ENCOUNTER — Inpatient Hospital Stay: Payer: Medicare Other

## 2022-07-09 VITALS — BP 107/75 | HR 103 | Temp 95.6°F | Ht 69.0 in | Wt 181.5 lb

## 2022-07-09 DIAGNOSIS — D500 Iron deficiency anemia secondary to blood loss (chronic): Secondary | ICD-10-CM | POA: Diagnosis not present

## 2022-07-09 DIAGNOSIS — D631 Anemia in chronic kidney disease: Secondary | ICD-10-CM | POA: Insufficient documentation

## 2022-07-09 DIAGNOSIS — Z7901 Long term (current) use of anticoagulants: Secondary | ICD-10-CM | POA: Diagnosis not present

## 2022-07-09 DIAGNOSIS — D509 Iron deficiency anemia, unspecified: Secondary | ICD-10-CM | POA: Diagnosis present

## 2022-07-09 DIAGNOSIS — Z79899 Other long term (current) drug therapy: Secondary | ICD-10-CM | POA: Insufficient documentation

## 2022-07-09 DIAGNOSIS — Z79620 Long term (current) use of immunosuppressive biologic: Secondary | ICD-10-CM | POA: Insufficient documentation

## 2022-07-09 DIAGNOSIS — N189 Chronic kidney disease, unspecified: Secondary | ICD-10-CM | POA: Diagnosis not present

## 2022-07-09 DIAGNOSIS — Z139 Encounter for screening, unspecified: Secondary | ICD-10-CM

## 2022-07-09 DIAGNOSIS — N184 Chronic kidney disease, stage 4 (severe): Secondary | ICD-10-CM | POA: Insufficient documentation

## 2022-07-09 LAB — CBC WITH DIFFERENTIAL/PLATELET
Abs Immature Granulocytes: 0.02 10*3/uL (ref 0.00–0.07)
Basophils Absolute: 0 10*3/uL (ref 0.0–0.1)
Basophils Relative: 1 %
Eosinophils Absolute: 0.3 10*3/uL (ref 0.0–0.5)
Eosinophils Relative: 5 %
HCT: 29.3 % — ABNORMAL LOW (ref 39.0–52.0)
Hemoglobin: 9 g/dL — ABNORMAL LOW (ref 13.0–17.0)
Immature Granulocytes: 0 %
Lymphocytes Relative: 24 %
Lymphs Abs: 1.5 10*3/uL (ref 0.7–4.0)
MCH: 27.7 pg (ref 26.0–34.0)
MCHC: 30.7 g/dL (ref 30.0–36.0)
MCV: 90.2 fL (ref 80.0–100.0)
Monocytes Absolute: 0.7 10*3/uL (ref 0.1–1.0)
Monocytes Relative: 11 %
Neutro Abs: 3.9 10*3/uL (ref 1.7–7.7)
Neutrophils Relative %: 59 %
Platelets: 243 10*3/uL (ref 150–400)
RBC: 3.25 MIL/uL — ABNORMAL LOW (ref 4.22–5.81)
RDW: 16.5 % — ABNORMAL HIGH (ref 11.5–15.5)
WBC: 6.5 10*3/uL (ref 4.0–10.5)
nRBC: 0 % (ref 0.0–0.2)

## 2022-07-09 LAB — IRON AND TIBC
Iron: 34 ug/dL — ABNORMAL LOW (ref 45–182)
Saturation Ratios: 8 % — ABNORMAL LOW (ref 17.9–39.5)
TIBC: 437 ug/dL (ref 250–450)
UIBC: 403 ug/dL

## 2022-07-09 LAB — RETIC PANEL
Immature Retic Fract: 19.6 % — ABNORMAL HIGH (ref 2.3–15.9)
RBC.: 3.21 MIL/uL — ABNORMAL LOW (ref 4.22–5.81)
Retic Count, Absolute: 64.8 10*3/uL (ref 19.0–186.0)
Retic Ct Pct: 2 % (ref 0.4–3.1)
Reticulocyte Hemoglobin: 26.4 pg — ABNORMAL LOW (ref >27.9)

## 2022-07-09 LAB — HEPATIC FUNCTION PANEL
ALT: 10 U/L (ref 0–44)
AST: 13 U/L — ABNORMAL LOW (ref 15–41)
Albumin: 3.7 g/dL (ref 3.5–5.0)
Alkaline Phosphatase: 56 U/L (ref 38–126)
Bilirubin, Direct: <0.1 mg/dL (ref 0.0–0.2)
Total Bilirubin: 0.4 mg/dL (ref 0.3–1.2)
Total Protein: 6.7 g/dL (ref 6.5–8.1)

## 2022-07-09 LAB — FOLATE: Folate: 9.6 ng/mL (ref >5.9)

## 2022-07-09 LAB — TSH: TSH: 1.732 u[IU]/mL (ref 0.350–4.500)

## 2022-07-09 LAB — FERRITIN: Ferritin: 15 ng/mL — ABNORMAL LOW (ref 24–336)

## 2022-07-09 LAB — VITAMIN B12: Vitamin B-12: 326 pg/mL (ref 180–914)

## 2022-07-09 NOTE — Assessment & Plan Note (Signed)
Check myeloma panel, light chain ratio. 

## 2022-07-09 NOTE — Addendum Note (Signed)
Addended by: Rickard Patience on: 07/09/2022 08:36 PM   Modules accepted: Orders

## 2022-07-09 NOTE — Progress Notes (Signed)
[facility] Clinical Social Work  Initial Assessment   Terry DORWARD Sr. is a 82 y.o. year old male contacted by phone. Clinical Social Work was referred by medical provider for assessment of psychosocial needs.   SDOH (Social Determinants of Health) assessments performed: Yes   SDOH Screenings   Food Insecurity: No Food Insecurity (07/09/2022)  Housing: Low Risk  (07/09/2022)  Transportation Needs: No Transportation Needs (07/09/2022)  Utilities: Not At Risk (07/09/2022)  Depression (PHQ2-9): Low Risk  (07/09/2022)  Tobacco Use: Medium Risk (07/09/2022)     Distress Screen completed: No    07/09/2022   11:06 AM  ONCBCN DISTRESS SCREENING  Screening Type Initial Screening  Distress experienced in past week (1-10) 2  Information Concerns Type Lack of info about diagnosis;Lack of info about treatment  Physical Problem type Sleep/insomnia      Family/Social Information:  Housing Arrangement: patient lives with his wife. Family members/support persons in your life? Family, Friends, and Estate agent concerns: no  Employment: Retired Income source: Actor concerns: No Type of concern: None Food access concerns: no Religious or spiritual practice: Yes Services Currently in place:  [employer]  Coping/ Adjustment to diagnosis: Patient understands treatment plan and what happens next? yes Concerns about diagnosis and/or treatment: Overwhelmed by information Patient reported stressors: Adjusting to my illness.  Patient stated he felt much better after speaking with the MD. Hopes and/or priorities: Family  Patient enjoys time with family/ friends Current coping skills/ strengths: Active sense of humor , Average or above average intelligence , Communication skills , Financial means , General fund of knowledge , and Supportive family/friends     SUMMARY: Current SDOH Barriers:  Patient did not identify any.  Clinical Social Work Clinical Goal(s):  No  clinical social work goals at this time  Interventions: Discussed common feeling and emotions when being diagnosed with cancer, and the importance of support during treatment Informed patient of the support team roles and support services at [facility] Provided CSW contact information and encouraged patient to call with any questions or concerns Provided patient with information about CSW role and services available.   Offered supportive counseling and active listening.   Follow Up Plan: Patient will contact CSW with any support or resource needs Patient verbalizes understanding of plan: Yes    Dorothey Baseman, LCSW Clinical Social Worker [facility]

## 2022-07-09 NOTE — Assessment & Plan Note (Addendum)
Chronic anemia, likely due to CKD.  Check cbc, iron tibc ferritin, b12, folate.  Lab Results  Component Value Date   HGB 9.0 (L) 07/09/2022   TIBC 437 07/09/2022   IRONPCTSAT 8 (L) 07/09/2022   FERRITIN 15 (L) 07/09/2022    I discussed about option of continue oral iron supplementation and repeat blood work for evaluation of treatment response.  If no significant improvement, then proceed with IV Venofer treatments. Alternative option of proceed with IV Venofer treatments. I discussed about the potential risks including but not limited to allergic reactions/infusion reactions including anaphylactic reactions, phlebitis, high blood pressure, wheezing, SOB, skin rash, weight gain,dark urine, leg swelling, back pain, headache, nausea and fatigue, etc. Patient desires to achieved higher level of iron faster for adequate hematopoesis. Plan IV venofer weekly x 5  Etiology of IDA, suspect GI blood loss, recommend GI work up

## 2022-07-09 NOTE — Progress Notes (Signed)
Hematology/Oncology Consult note [phone] [phone]      Patient Care Team: Jaclyn Shaggy, MD as PCP - General (Internal Medicine)   REFERRING PROVIDER: Antonieta Iba, MD  CHIEF COMPLAINTS/REASON FOR VISIT:  Anemia  ASSESSMENT & PLAN:  Anemia in chronic kidney disease (CKD) Chronic anemia, likely due to CKD.  Check cbc, iron tibc ferritin, b12, folate.  Lab Results  Component Value Date   HGB 9.0 (L) 07/09/2022   TIBC 437 07/09/2022   IRONPCTSAT 8 (L) 07/09/2022   FERRITIN 15 (L) 07/09/2022    I discussed about option of continue oral iron supplementation and repeat blood work for evaluation of treatment response.  If no significant improvement, then proceed with IV Venofer treatments. Alternative option of proceed with IV Venofer treatments. I discussed about the potential risks including but not limited to allergic reactions/infusion reactions including anaphylactic reactions, phlebitis, high blood pressure, wheezing, SOB, skin rash, weight gain,dark urine, leg swelling, back pain, headache, nausea and fatigue, etc. Patient desires to achieved higher level of iron faster for adequate hematopoesis. Plan IV venofer weekly x 5  Etiology of IDA, suspect GI blood loss, recommend GI work up   CKD (chronic kidney disease) stage 4, GFR 15-29 ml/min (HCC) Check myeloma panel, light chain ratio.   IDA (iron deficiency anemia) IV venofer treatments  Orders Placed This Encounter  Procedures   CBC with Differential/Platelet    Standing Status:   Future    Number of Occurrences:   1    Standing Expiration Date:   07/09/2023   Hepatic function panel    Standing Status:   Future    Number of Occurrences:   1    Standing Expiration Date:   07/09/2023   Retic Panel    Standing Status:   Future    Number of Occurrences:   1    Standing Expiration Date:   07/09/2023   Vitamin B12    Standing Status:   Future    Number of Occurrences:   1    Standing  Expiration Date:   07/09/2023   Folate    Standing Status:   Future    Number of Occurrences:   1    Standing Expiration Date:   07/09/2023   Iron and TIBC    Standing Status:   Future    Number of Occurrences:   1    Standing Expiration Date:   07/09/2023   Ferritin    Standing Status:   Future    Number of Occurrences:   1    Standing Expiration Date:   01/09/2023   Multiple Myeloma Panel (SPEP&IFE w/QIG)    Standing Status:   Future    Number of Occurrences:   1    Standing Expiration Date:   07/09/2023   Kappa/lambda light chains    Standing Status:   Future    Number of Occurrences:   1    Standing Expiration Date:   07/09/2023   TSH    Standing Status:   Future    Number of Occurrences:   1    Standing Expiration Date:   07/09/2023   Ambulatory referral to Social Work    Referral Priority:   Routine    Referral Type:   Consultation    Referral Reason:   Specialty Services Required    Number of Visits Requested:   1   Follow up in 2 months All questions were answered. The patient knows to call  the clinic with any problems, questions or concerns.  Terry Patience, MD, PhD [facility] Hematology Oncology 07/09/2022     HISTORY OF PRESENTING ILLNESS:  Terry HOLDMAN Sr. is a  82 y.o.  male with PMH listed below who was referred to me for anemia Reviewed patient's recent labs that was done.  Patient CKD, chronic anemia, Hb progressively decreases to be 8.5 recently.  He denies recent chest pain on exertion, shortness of breath on minimal exertion, pre-syncopal episodes, or palpitations He had not noticed any recent bleeding such as epistaxis, hematuria or hematochezia.  He denies over the counter NSAID ingestion. He is on eliquis 2.5mg  BID per cardiology His last colonoscopy - unknown.  He denies any pica and eats a variety of diet.    MEDICAL HISTORY:  Past Medical History:  Diagnosis Date   A-fib (HCC)    CAD (coronary artery disease)    Chronic kidney disease     Hypertension     SURGICAL HISTORY: Past Surgical History:  Procedure Laterality Date   AORTIC VALVE REPAIR     CORONARY ARTERY BYPASS GRAFT      SOCIAL HISTORY: Social History   Socioeconomic History   Marital status: Married    Spouse name: Not on file   Number of children: Not on file   Years of education: Not on file   Highest education level: Not on file  Occupational History   Not on file  Tobacco Use   Smoking status: Former   Smokeless tobacco: Never  Substance and Sexual Activity   Alcohol use: No   Drug use: No   Sexual activity: Not on file  Other Topics Concern   Not on file  Social History Narrative   Not on file   Social Determinants of Health   Financial Resource Strain: Not on file  Food Insecurity: No Food Insecurity (07/09/2022)   Hunger Vital Sign    Worried About Running Out of Food in the Last Year: Never true    Ran Out of Food in the Last Year: Never true  Transportation Needs: No Transportation Needs (07/09/2022)   PRAPARE - Administrator, Civil Service (Medical): No    Lack of Transportation (Non-Medical): No  Physical Activity: Not on file  Stress: Not on file  Social Connections: Not on file  Intimate Partner Violence: Not At Risk (07/09/2022)   Humiliation, Afraid, Rape, and Kick questionnaire    Fear of Current or Ex-Partner: No    Emotionally Abused: No    Physically Abused: No    Sexually Abused: No    FAMILY HISTORY: Family History  Problem Relation Age of Onset   Aneurysm Mother    Heart attack Father    Heart attack Sister    Heart attack Brother     ALLERGIES:  is allergic to phenothiazines and prochlorperazine edisylate.  MEDICATIONS:  Current Outpatient Medications  Medication Sig Dispense Refill   Acetaminophen 500 MG coapsule 500 mg  prn as needed     atorvastatin (LIPITOR) 20 MG tablet Take 1 tablet (20 mg total) by mouth daily. 90 tablet 3   diclofenac Sodium (VOLTAREN) 1 % GEL Apply topically 4  (four) times daily as needed.     ELIQUIS 2.5 MG TABS tablet Take 1 tablet (2.5 mg total) by mouth 2 (two) times daily. 180 tablet 3   esomeprazole (NEXIUM) 40 MG capsule Take 40 mg by mouth as needed.     fluticasone (FLONASE) 50 MCG/ACT nasal  spray Place into the nose.     furosemide (LASIX) 40 MG tablet Take 1 tablet (40 mg total) by mouth 2 (two) times daily. 180 tablet 3   losartan (COZAAR) 50 MG tablet Take 1 tablet (50 mg total) by mouth daily. 90 tablet 3   metoprolol succinate (TOPROL-XL) 100 MG 24 hr tablet Take 1 tablet (100 mg total) by mouth 2 (two) times daily. Take with or immediately following a meal. 180 tablet 3   Saw Palmetto 450 MG CAPS Take 450 mg by mouth daily.     sodium bicarbonate 650 MG tablet Take 650 mg by mouth 2 (two) times daily.     tacrolimus (PROTOPIC) 0.1 % ointment Apply topically.     tamsulosin (FLOMAX) 0.4 MG CAPS capsule Take 0.4 mg by mouth daily after supper.     amoxicillin (AMOXIL) 500 MG capsule amoxicillin 500 mg capsule  TAKE 4 CAPSULES BY MOUTH 1 HOUR PRIOR TO APPT (Patient not taking: Reported on 05/29/2022)     Cholecalciferol (VITAMIN D3) 2000 units capsule Take by mouth. (Patient not taking: Reported on 07/09/2022)     psyllium (METAMUCIL) 0.52 g capsule Metamucil (Patient not taking: Reported on 07/09/2022)     No current facility-administered medications for this visit.    Review of Systems - Oncology  PHYSICAL EXAMINATION: Vitals:   07/09/22 1052  BP: 107/75  Pulse: (!) 103  Temp: (!) 95.6 F (35.3 C)  SpO2: 100%   Filed Weights   07/09/22 1052  Weight: 181 lb 8 oz (82.3 kg)    Physical Exam Constitutional:      General: He is not in acute distress. HENT:     Head: Normocephalic and atraumatic.  Eyes:     General: No scleral icterus. Cardiovascular:     Rate and Rhythm: Normal rate and regular rhythm.  Pulmonary:     Effort: Pulmonary effort is normal. No respiratory distress.  Abdominal:     General: Bowel sounds are  normal. There is no distension.     Palpations: Abdomen is soft.  Musculoskeletal:        General: No deformity. Normal range of motion.     Cervical back: Normal range of motion and neck supple.  Skin:    General: Skin is warm and dry.     Findings: No erythema or rash.  Neurological:     Mental Status: He is alert and oriented to person, place, and time. Mental status is at baseline.     Cranial Nerves: No cranial nerve deficit.     Coordination: Coordination normal.  Psychiatric:        Mood and Affect: Mood normal.      LABORATORY DATA:  I have reviewed the data as listed    Latest Ref Rng & Units 07/09/2022   11:57 AM 03/20/2022   12:16 PM 07/01/2006   11:32 AM  CBC  WBC 4.0 - 10.5 K/uL 6.5  5.0  5.9   Hemoglobin 13.0 - 17.0 g/dL 9.0  16.1  09.6   Hematocrit 39.0 - 52.0 % 29.3  32.7  44.2   Platelets 150 - 400 K/uL 243  234  229       Latest Ref Rng & Units 07/09/2022   11:57 AM 03/20/2022   12:16 PM 07/01/2006   11:32 AM  CMP  Glucose 70 - 99 mg/dL  045  85   BUN 8 - 23 mg/dL  90  18   Creatinine 4.09 - 1.24 mg/dL  4.68  1.63   Sodium 135 - 145 mmol/L  139  141   Potassium 3.5 - 5.1 mmol/L  4.5  4.0   Chloride 98 - 111 mmol/L  111  103   CO2 22 - 32 mmol/L  21  30   Calcium 8.9 - 10.3 mg/dL  8.5  9.3   Total Protein 6.5 - 8.1 g/dL 6.7  5.8    Total Bilirubin 0.3 - 1.2 mg/dL 0.4  0.5    Alkaline Phos 38 - 126 U/L 56  67    AST 15 - 41 U/L 13  15    ALT 0 - 44 U/L 10  19     Lab Results  Component Value Date   IRON 34 (L) 07/09/2022   TIBC 437 07/09/2022   IRONPCTSAT 8 (L) 07/09/2022   FERRITIN 15 (L) 07/09/2022     RADIOGRAPHIC STUDIES: I have personally reviewed the radiological images as listed and agreed with the findings in the report. No results found.

## 2022-07-09 NOTE — Assessment & Plan Note (Signed)
IV venofer treatments

## 2022-07-10 ENCOUNTER — Telehealth: Payer: Self-pay

## 2022-07-10 LAB — KAPPA/LAMBDA LIGHT CHAINS
Kappa free light chain: 79.8 mg/L — ABNORMAL HIGH (ref 3.3–19.4)
Kappa, lambda light chain ratio: 1.36 (ref 0.26–1.65)
Lambda free light chains: 58.7 mg/L — ABNORMAL HIGH (ref 5.7–26.3)

## 2022-07-10 NOTE — Telephone Encounter (Signed)
-----   Message from Rickard Patience, MD sent at 07/09/2022  8:33 PM EDT ----- Please let patient know that his iron level is low, please arrange patient to get IV Venofer weekly x 5. We discussed during his visit.  Follow up in 2 months, labs prior to to MD +/- venofer +/- retacrit.  Labs are ordered. Thanks zy

## 2022-07-12 NOTE — Telephone Encounter (Signed)
Called pt. No answer. Unable to leave V/M.

## 2022-07-15 ENCOUNTER — Encounter: Payer: Self-pay | Admitting: Oncology

## 2022-07-15 ENCOUNTER — Telehealth: Payer: Self-pay | Admitting: *Deleted

## 2022-07-15 LAB — MULTIPLE MYELOMA PANEL, SERUM
Albumin SerPl Elph-Mcnc: 3.5 g/dL (ref 2.9–4.4)
Albumin/Glob SerPl: 1.5 (ref 0.7–1.7)
Alpha 1: 0.3 g/dL (ref 0.0–0.4)
Alpha2 Glob SerPl Elph-Mcnc: 0.7 g/dL (ref 0.4–1.0)
B-Globulin SerPl Elph-Mcnc: 0.9 g/dL (ref 0.7–1.3)
Gamma Glob SerPl Elph-Mcnc: 0.6 g/dL (ref 0.4–1.8)
Globulin, Total: 2.5 g/dL (ref 2.2–3.9)
IgA: 177 mg/dL (ref 61–437)
IgG (Immunoglobin G), Serum: 637 mg/dL (ref 603–1613)
IgM (Immunoglobulin M), Srm: 61 mg/dL (ref 15–143)
Total Protein ELP: 6 g/dL (ref 6.0–8.5)

## 2022-07-15 NOTE — Telephone Encounter (Signed)
Patient called asking if we were going to do anything fo him based on his lab results or not as he has not heard from our office since his appointment. I see on his results that there is a note to schedule him for IV Iron, but this has not been scheduled yet either.  Please let patient know that his iron level is low, please arrange patient to get IV Venofer weekly x 5. We discussed during his visit. Follow up in 2 months, labs prior to to MD +/- venofer +/- retacrit. Labs are ordered. Thanks zy

## 2022-07-22 ENCOUNTER — Inpatient Hospital Stay: Payer: Medicare Other

## 2022-07-22 VITALS — BP 106/63 | HR 89 | Temp 97.2°F | Resp 16

## 2022-07-22 DIAGNOSIS — D509 Iron deficiency anemia, unspecified: Secondary | ICD-10-CM | POA: Diagnosis not present

## 2022-07-22 DIAGNOSIS — D631 Anemia in chronic kidney disease: Secondary | ICD-10-CM

## 2022-07-22 DIAGNOSIS — D500 Iron deficiency anemia secondary to blood loss (chronic): Secondary | ICD-10-CM

## 2022-07-22 MED ORDER — SODIUM CHLORIDE 0.9 % IV SOLN
200.0000 mg | Freq: Once | INTRAVENOUS | Status: AC
  Administered 2022-07-22: 200 mg via INTRAVENOUS
  Filled 2022-07-22: qty 200

## 2022-07-22 MED ORDER — SODIUM CHLORIDE 0.9 % IV SOLN
Freq: Once | INTRAVENOUS | Status: AC
  Filled 2022-07-22: qty 250

## 2022-07-22 NOTE — Patient Instructions (Signed)
Iron Sucrose Injection What is this medication? IRON SUCROSE (EYE ern SOO krose) treats low levels of iron (iron deficiency anemia) in people with kidney disease. Iron is a mineral that plays an important role in making red blood cells, which carry oxygen from your lungs to the rest of your body. This medicine may be used for other purposes; ask your health care provider or pharmacist if you have questions. COMMON BRAND NAME(S): Venofer What should I tell my care team before I take this medication? They need to know if you have any of these conditions: Anemia not caused by low iron levels Heart disease High levels of iron in the blood Kidney disease Liver disease An unusual or allergic reaction to iron, other medications, foods, dyes, or preservatives Pregnant or trying to get pregnant Breastfeeding How should I use this medication? This medication is for infusion into a vein. It is given in a hospital or clinic setting. Talk to your care team about the use of this medication in children. While this medication may be prescribed for children as young as 2 years for selected conditions, precautions do apply. Overdosage: If you think you have taken too much of this medicine contact a poison control center or emergency room at once. NOTE: This medicine is only for you. Do not share this medicine with others. What if I miss a dose? Keep appointments for follow-up doses. It is important not to miss your dose. Call your care team if you are unable to keep an appointment. What may interact with this medication? Do not take this medication with any of the following: Deferoxamine Dimercaprol Other iron products This medication may also interact with the following: Chloramphenicol Deferasirox This list may not describe all possible interactions. Give your health care provider a list of all the medicines, herbs, non-prescription drugs, or dietary supplements you use. Also tell them if you smoke,  drink alcohol, or use illegal drugs. Some items may interact with your medicine. What should I watch for while using this medication? Visit your care team regularly. Tell your care team if your symptoms do not start to get better or if they get worse. You may need blood work done while you are taking this medication. You may need to follow a special diet. Talk to your care team. Foods that contain iron include: whole grains/cereals, dried fruits, beans, or peas, leafy green vegetables, and organ meats (liver, kidney). What side effects may I notice from receiving this medication? Side effects that you should report to your care team as soon as possible: Allergic reactions--skin rash, itching, hives, swelling of the face, lips, tongue, or throat Low blood pressure--dizziness, feeling faint or lightheaded, blurry vision Shortness of breath Side effects that usually do not require medical attention (report to your care team if they continue or are bothersome): Flushing Headache Joint pain Muscle pain Nausea Pain, redness, or irritation at injection site This list may not describe all possible side effects. Call your doctor for medical advice about side effects. You may report side effects to FDA at 1-800-FDA-1088. Where should I keep my medication? This medication is given in a hospital or clinic and will not be stored at home. NOTE: This sheet is a summary. It may not cover all possible information. If you have questions about this medicine, talk to your doctor, pharmacist, or health care provider.  2024 Elsevier/Gold Standard (2021-07-04 00:00:00)  

## 2022-07-29 ENCOUNTER — Inpatient Hospital Stay: Payer: Medicare Other

## 2022-07-29 VITALS — BP 116/77 | HR 58 | Temp 97.5°F | Resp 18

## 2022-07-29 DIAGNOSIS — D631 Anemia in chronic kidney disease: Secondary | ICD-10-CM

## 2022-07-29 DIAGNOSIS — D509 Iron deficiency anemia, unspecified: Secondary | ICD-10-CM | POA: Diagnosis not present

## 2022-07-29 DIAGNOSIS — D500 Iron deficiency anemia secondary to blood loss (chronic): Secondary | ICD-10-CM

## 2022-07-29 MED ORDER — SODIUM CHLORIDE 0.9 % IV SOLN
Freq: Once | INTRAVENOUS | Status: AC
  Filled 2022-07-29: qty 250

## 2022-07-29 MED ORDER — SODIUM CHLORIDE 0.9 % IV SOLN
200.0000 mg | Freq: Once | INTRAVENOUS | Status: AC
  Administered 2022-07-29: 200 mg via INTRAVENOUS
  Filled 2022-07-29: qty 200

## 2022-08-05 ENCOUNTER — Inpatient Hospital Stay: Payer: Medicare Other

## 2022-08-05 VITALS — BP 128/62 | HR 59 | Temp 97.0°F | Resp 18

## 2022-08-05 DIAGNOSIS — D500 Iron deficiency anemia secondary to blood loss (chronic): Secondary | ICD-10-CM

## 2022-08-05 DIAGNOSIS — D509 Iron deficiency anemia, unspecified: Secondary | ICD-10-CM | POA: Diagnosis not present

## 2022-08-05 DIAGNOSIS — D631 Anemia in chronic kidney disease: Secondary | ICD-10-CM

## 2022-08-05 MED ORDER — SODIUM CHLORIDE 0.9 % IV SOLN
200.0000 mg | Freq: Once | INTRAVENOUS | Status: AC
  Administered 2022-08-05: 200 mg via INTRAVENOUS
  Filled 2022-08-05: qty 200

## 2022-08-05 MED ORDER — SODIUM CHLORIDE 0.9 % IV SOLN
Freq: Once | INTRAVENOUS | Status: AC
  Filled 2022-08-05: qty 250

## 2022-08-05 NOTE — Patient Instructions (Signed)
Iron Sucrose Injection What is this medication? IRON SUCROSE (EYE ern SOO krose) treats low levels of iron (iron deficiency anemia) in people with kidney disease. Iron is a mineral that plays an important role in making red blood cells, which carry oxygen from your lungs to the rest of your body. This medicine may be used for other purposes; ask your health care provider or pharmacist if you have questions. COMMON BRAND NAME(S): Venofer What should I tell my care team before I take this medication? They need to know if you have any of these conditions: Anemia not caused by low iron levels Heart disease High levels of iron in the blood Kidney disease Liver disease An unusual or allergic reaction to iron, other medications, foods, dyes, or preservatives Pregnant or trying to get pregnant Breastfeeding How should I use this medication? This medication is for infusion into a vein. It is given in a hospital or clinic setting. Talk to your care team about the use of this medication in children. While this medication may be prescribed for children as young as 2 years for selected conditions, precautions do apply. Overdosage: If you think you have taken too much of this medicine contact a poison control center or emergency room at once. NOTE: This medicine is only for you. Do not share this medicine with others. What if I miss a dose? Keep appointments for follow-up doses. It is important not to miss your dose. Call your care team if you are unable to keep an appointment. What may interact with this medication? Do not take this medication with any of the following: Deferoxamine Dimercaprol Other iron products This medication may also interact with the following: Chloramphenicol Deferasirox This list may not describe all possible interactions. Give your health care provider a list of all the medicines, herbs, non-prescription drugs, or dietary supplements you use. Also tell them if you smoke,  drink alcohol, or use illegal drugs. Some items may interact with your medicine. What should I watch for while using this medication? Visit your care team regularly. Tell your care team if your symptoms do not start to get better or if they get worse. You may need blood work done while you are taking this medication. You may need to follow a special diet. Talk to your care team. Foods that contain iron include: whole grains/cereals, dried fruits, beans, or peas, leafy green vegetables, and organ meats (liver, kidney). What side effects may I notice from receiving this medication? Side effects that you should report to your care team as soon as possible: Allergic reactions--skin rash, itching, hives, swelling of the face, lips, tongue, or throat Low blood pressure--dizziness, feeling faint or lightheaded, blurry vision Shortness of breath Side effects that usually do not require medical attention (report to your care team if they continue or are bothersome): Flushing Headache Joint pain Muscle pain Nausea Pain, redness, or irritation at injection site This list may not describe all possible side effects. Call your doctor for medical advice about side effects. You may report side effects to FDA at 1-800-FDA-1088. Where should I keep my medication? This medication is given in a hospital or clinic. It will not be stored at home. NOTE: This sheet is a summary. It may not cover all possible information. If you have questions about this medicine, talk to your doctor, pharmacist, or health care provider.  2024 Elsevier/Gold Standard (2022-05-31 00:00:00)

## 2022-08-12 ENCOUNTER — Inpatient Hospital Stay: Payer: Medicare Other | Attending: Oncology

## 2022-08-12 VITALS — BP 102/67 | HR 69 | Temp 98.1°F

## 2022-08-12 DIAGNOSIS — N189 Chronic kidney disease, unspecified: Secondary | ICD-10-CM

## 2022-08-12 DIAGNOSIS — D631 Anemia in chronic kidney disease: Secondary | ICD-10-CM | POA: Insufficient documentation

## 2022-08-12 DIAGNOSIS — D509 Iron deficiency anemia, unspecified: Secondary | ICD-10-CM | POA: Diagnosis not present

## 2022-08-12 DIAGNOSIS — N184 Chronic kidney disease, stage 4 (severe): Secondary | ICD-10-CM | POA: Diagnosis present

## 2022-08-12 DIAGNOSIS — D500 Iron deficiency anemia secondary to blood loss (chronic): Secondary | ICD-10-CM

## 2022-08-12 MED ORDER — SODIUM CHLORIDE 0.9 % IV SOLN
Freq: Once | INTRAVENOUS | Status: AC
  Filled 2022-08-12: qty 250

## 2022-08-12 MED ORDER — SODIUM CHLORIDE 0.9 % IV SOLN
200.0000 mg | Freq: Once | INTRAVENOUS | Status: AC
  Administered 2022-08-12: 200 mg via INTRAVENOUS
  Filled 2022-08-12: qty 200

## 2022-08-19 ENCOUNTER — Inpatient Hospital Stay: Payer: Medicare Other

## 2022-08-19 VITALS — BP 104/49 | HR 54 | Temp 98.0°F | Resp 18

## 2022-08-19 DIAGNOSIS — N184 Chronic kidney disease, stage 4 (severe): Secondary | ICD-10-CM | POA: Diagnosis not present

## 2022-08-19 DIAGNOSIS — D500 Iron deficiency anemia secondary to blood loss (chronic): Secondary | ICD-10-CM

## 2022-08-19 DIAGNOSIS — N189 Chronic kidney disease, unspecified: Secondary | ICD-10-CM

## 2022-08-19 MED ORDER — SODIUM CHLORIDE 0.9 % IV SOLN
Freq: Once | INTRAVENOUS | Status: AC
  Filled 2022-08-19: qty 250

## 2022-08-19 MED ORDER — SODIUM CHLORIDE 0.9 % IV SOLN
200.0000 mg | Freq: Once | INTRAVENOUS | Status: AC
  Administered 2022-08-19: 200 mg via INTRAVENOUS
  Filled 2022-08-19: qty 200

## 2022-08-19 NOTE — Progress Notes (Signed)
Pt has been educated and understands. Pt declined to stay 30 mins after iron infusion. VSS.

## 2022-09-16 ENCOUNTER — Inpatient Hospital Stay: Payer: Medicare Other | Attending: Oncology

## 2022-09-16 DIAGNOSIS — Z79899 Other long term (current) drug therapy: Secondary | ICD-10-CM | POA: Diagnosis not present

## 2022-09-16 DIAGNOSIS — D500 Iron deficiency anemia secondary to blood loss (chronic): Secondary | ICD-10-CM

## 2022-09-16 DIAGNOSIS — N184 Chronic kidney disease, stage 4 (severe): Secondary | ICD-10-CM | POA: Insufficient documentation

## 2022-09-16 DIAGNOSIS — D509 Iron deficiency anemia, unspecified: Secondary | ICD-10-CM | POA: Diagnosis not present

## 2022-09-16 DIAGNOSIS — D631 Anemia in chronic kidney disease: Secondary | ICD-10-CM | POA: Insufficient documentation

## 2022-09-16 LAB — RETIC PANEL
Immature Retic Fract: 12.3 % (ref 2.3–15.9)
RBC.: 3.21 MIL/uL — ABNORMAL LOW (ref 4.22–5.81)
Retic Count, Absolute: 49.8 10*3/uL (ref 19.0–186.0)
Retic Ct Pct: 1.6 % (ref 0.4–3.1)
Reticulocyte Hemoglobin: 29.3 pg (ref >27.9)

## 2022-09-16 LAB — CBC WITH DIFFERENTIAL/PLATELET
Abs Immature Granulocytes: 0.04 10*3/uL (ref 0.00–0.07)
Basophils Absolute: 0 10*3/uL (ref 0.0–0.1)
Basophils Relative: 1 %
Eosinophils Absolute: 0.2 10*3/uL (ref 0.0–0.5)
Eosinophils Relative: 4 %
HCT: 30.6 % — ABNORMAL LOW (ref 39.0–52.0)
Hemoglobin: 9.3 g/dL — ABNORMAL LOW (ref 13.0–17.0)
Immature Granulocytes: 1 %
Lymphocytes Relative: 24 %
Lymphs Abs: 1 10*3/uL (ref 0.7–4.0)
MCH: 29.2 pg (ref 26.0–34.0)
MCHC: 30.4 g/dL (ref 30.0–36.0)
MCV: 95.9 fL (ref 80.0–100.0)
Monocytes Absolute: 0.4 10*3/uL (ref 0.1–1.0)
Monocytes Relative: 10 %
Neutro Abs: 2.4 10*3/uL (ref 1.7–7.7)
Neutrophils Relative %: 60 %
Platelets: 131 10*3/uL — ABNORMAL LOW (ref 150–400)
RBC: 3.19 MIL/uL — ABNORMAL LOW (ref 4.22–5.81)
RDW: 16.7 % — ABNORMAL HIGH (ref 11.5–15.5)
WBC: 4 10*3/uL (ref 4.0–10.5)
nRBC: 0 % (ref 0.0–0.2)

## 2022-09-16 LAB — IRON AND TIBC
Iron: 47 ug/dL (ref 45–182)
Saturation Ratios: 14 % — ABNORMAL LOW (ref 17.9–39.5)
TIBC: 340 ug/dL (ref 250–450)
UIBC: 293 ug/dL

## 2022-09-16 LAB — FERRITIN: Ferritin: 50 ng/mL (ref 24–336)

## 2022-09-19 ENCOUNTER — Encounter: Payer: Self-pay | Admitting: Oncology

## 2022-09-19 ENCOUNTER — Inpatient Hospital Stay: Payer: Medicare Other

## 2022-09-19 ENCOUNTER — Inpatient Hospital Stay: Payer: Medicare Other | Admitting: Oncology

## 2022-09-19 VITALS — BP 104/58 | HR 59 | Temp 96.0°F | Resp 18 | Wt 178.8 lb

## 2022-09-19 VITALS — BP 126/64 | HR 62 | Resp 18

## 2022-09-19 DIAGNOSIS — D631 Anemia in chronic kidney disease: Secondary | ICD-10-CM

## 2022-09-19 DIAGNOSIS — D500 Iron deficiency anemia secondary to blood loss (chronic): Secondary | ICD-10-CM

## 2022-09-19 DIAGNOSIS — N184 Chronic kidney disease, stage 4 (severe): Secondary | ICD-10-CM | POA: Diagnosis not present

## 2022-09-19 MED ORDER — SODIUM CHLORIDE 0.9 % IV SOLN
200.0000 mg | Freq: Once | INTRAVENOUS | Status: AC
  Administered 2022-09-19: 200 mg via INTRAVENOUS
  Filled 2022-09-19: qty 200

## 2022-09-19 MED ORDER — SODIUM CHLORIDE 0.9 % IV SOLN
Freq: Once | INTRAVENOUS | Status: AC
  Filled 2022-09-19: qty 250

## 2022-09-19 NOTE — Assessment & Plan Note (Signed)
No M protein on myeloma panel,normal  light chain ratio.  Encourage oral hydration and avoid nephrotoxins.

## 2022-09-19 NOTE — Assessment & Plan Note (Signed)
Labs are reviewed and discussed with patient. Lab Results  Component Value Date   HGB 9.3 (L) 09/16/2022   TIBC 340 09/16/2022   IRONPCTSAT 14 (L) 09/16/2022   FERRITIN 50 09/16/2022    Etiology of IDA, suspect GI blood loss,  refer to GI  he prefers [facility].  Venofer weekly x 4

## 2022-09-19 NOTE — Assessment & Plan Note (Addendum)
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  Hb is stable.  Venofer treatments to further improve iron store.

## 2022-09-19 NOTE — Progress Notes (Signed)
Hematology/Oncology Progress note [phone] [phone]         Patient Care Team: Jaclyn Shaggy, MD as PCP - General (Internal Medicine) Rickard Patience, MD as Consulting Physician (Oncology)   REFERRING PROVIDER: Jaclyn Shaggy, MD  CHIEF COMPLAINTS/REASON FOR VISIT:  Anemia  ASSESSMENT & PLAN:  Anemia in chronic kidney disease (CKD) Combination of IDA and anemia due to CKD.  Hb is stable.  Venofer treatments to further improve iron store.      CKD (chronic kidney disease) stage 4, GFR 15-29 ml/min (HCC) No M protein on myeloma panel,normal  light chain ratio.  Encourage oral hydration and avoid nephrotoxins.    IDA (iron deficiency anemia) Labs are reviewed and discussed with patient. Lab Results  Component Value Date   HGB 9.3 (L) 09/16/2022   TIBC 340 09/16/2022   IRONPCTSAT 14 (L) 09/16/2022   FERRITIN 50 09/16/2022    Etiology of IDA, suspect GI blood loss,  refer to GI  [facility].  Venofer weekly x 4   Orders Placed This Encounter  Procedures   CBC with Differential (Cancer Center Only)    Standing Status:   Future    Standing Expiration Date:   09/19/2023   Iron and TIBC    Standing Status:   Future    Standing Expiration Date:   09/19/2023   Ferritin    Standing Status:   Future    Standing Expiration Date:   09/19/2023   Retic Panel    Standing Status:   Future    Standing Expiration Date:   09/19/2023   Ambulatory referral to Gastroenterology    Referral Priority:   Routine    Referral Type:   Consultation    Referral Reason:   Specialty Services Required    Number of Visits Requested:   1   Follow up in 2 months All questions were answered. The patient knows to call the clinic with any problems, questions or concerns.  Rickard Patience, MD, PhD [facility] Hematology Oncology 09/19/2022     HISTORY OF PRESENTING ILLNESS:  ELDEN AN Sr. is a  82 y.o.  male with PMH listed below who was referred to me for  anemia Reviewed patient's recent labs that was done.  Patient CKD, chronic anemia, Hb progressively decreases to be 8.5 recently.  He denies recent chest pain on exertion, shortness of breath on minimal exertion, pre-syncopal episodes, or palpitations He had not noticed any recent bleeding such as epistaxis, hematuria or hematochezia.  He denies over the counter NSAID ingestion. He is on eliquis 2.5mg  BID per cardiology His last colonoscopy - unknown.  He denies any pica and eats a variety of diet.  INTERVAL HISTORY DONEVAN CLYNE Sr. is a 82 y.o. male who has above history reviewed by me today presents for follow up visit for anemia.  He tolerates IV venofer and fatigue has improved.    MEDICAL HISTORY:  Past Medical History:  Diagnosis Date   A-fib (HCC)    CAD (coronary artery disease)    Chronic kidney disease    Hypertension     SURGICAL HISTORY: Past Surgical History:  Procedure Laterality Date   AORTIC VALVE REPAIR     CORONARY ARTERY BYPASS GRAFT      SOCIAL HISTORY: Social History   Socioeconomic History   Marital status: Married    Spouse name: Not on file   Number of children: Not on file   Years of education: Not  on file   Highest education level: Not on file  Occupational History   Not on file  Tobacco Use   Smoking status: Former   Smokeless tobacco: Never  Substance and Sexual Activity   Alcohol use: No   Drug use: No   Sexual activity: Not on file  Other Topics Concern   Not on file  Social History Narrative   Not on file   Social Determinants of Health   Financial Resource Strain: Not on file  Food Insecurity: No Food Insecurity (07/09/2022)   Hunger Vital Sign    Worried About Running Out of Food in the Last Year: Never true    Ran Out of Food in the Last Year: Never true  Transportation Needs: No Transportation Needs (07/09/2022)   PRAPARE - Administrator, Civil Service (Medical): No    Lack of Transportation (Non-Medical):  No  Physical Activity: Not on file  Stress: Not on file  Social Connections: Not on file  Intimate Partner Violence: Not At Risk (07/09/2022)   Humiliation, Afraid, Rape, and Kick questionnaire    Fear of Current or Ex-Partner: No    Emotionally Abused: No    Physically Abused: No    Sexually Abused: No    FAMILY HISTORY: Family History  Problem Relation Age of Onset   Aneurysm Mother    Heart attack Father    Heart attack Sister    Heart attack Brother     ALLERGIES:  is allergic to phenothiazines and prochlorperazine edisylate.  MEDICATIONS:  Current Outpatient Medications  Medication Sig Dispense Refill   Acetaminophen 500 MG coapsule 500 mg  prn as needed     atorvastatin (LIPITOR) 20 MG tablet Take 1 tablet (20 mg total) by mouth daily. 90 tablet 3   diclofenac Sodium (VOLTAREN) 1 % GEL Apply topically 4 (four) times daily as needed.     ELIQUIS 2.5 MG TABS tablet Take 1 tablet (2.5 mg total) by mouth 2 (two) times daily. 180 tablet 3   esomeprazole (NEXIUM) 40 MG capsule Take 40 mg by mouth as needed.     fluticasone (FLONASE) 50 MCG/ACT nasal spray Place into the nose.     furosemide (LASIX) 40 MG tablet Take 1 tablet (40 mg total) by mouth 2 (two) times daily. 180 tablet 3   losartan (COZAAR) 50 MG tablet Take 1 tablet (50 mg total) by mouth daily. 90 tablet 3   metoprolol succinate (TOPROL-XL) 100 MG 24 hr tablet Take 1 tablet (100 mg total) by mouth 2 (two) times daily. Take with or immediately following a meal. 180 tablet 3   Saw Palmetto 450 MG CAPS Take 450 mg by mouth daily.     sodium bicarbonate 650 MG tablet Take 650 mg by mouth 2 (two) times daily.     tacrolimus (PROTOPIC) 0.1 % ointment Apply topically.     tamsulosin (FLOMAX) 0.4 MG CAPS capsule Take 0.4 mg by mouth daily after supper.     amoxicillin (AMOXIL) 500 MG capsule amoxicillin 500 mg capsule  TAKE 4 CAPSULES BY MOUTH 1 HOUR PRIOR TO APPT (Patient not taking: Reported on 09/19/2022)      Cholecalciferol (VITAMIN D3) 2000 units capsule Take by mouth. (Patient not taking: Reported on 07/09/2022)     psyllium (METAMUCIL) 0.52 g capsule Metamucil (Patient not taking: Reported on 07/09/2022)     No current facility-administered medications for this visit.    Review of Systems - Oncology  PHYSICAL EXAMINATION: Vitals:  09/19/22 1126  BP: (!) 104/58  Pulse: (!) 59  Resp: 18  Temp: (!) 96 F (35.6 C)  SpO2: 100%   Filed Weights   09/19/22 1126  Weight: 178 lb 12.8 oz (81.1 kg)    Physical Exam Constitutional:      General: He is not in acute distress. HENT:     Head: Normocephalic and atraumatic.  Eyes:     General: No scleral icterus. Cardiovascular:     Rate and Rhythm: Normal rate and regular rhythm.  Pulmonary:     Effort: Pulmonary effort is normal. No respiratory distress.  Abdominal:     General: Bowel sounds are normal. There is no distension.     Palpations: Abdomen is soft.  Musculoskeletal:        General: No deformity. Normal range of motion.     Cervical back: Normal range of motion and neck supple.  Skin:    General: Skin is warm and dry.     Findings: No erythema or rash.  Neurological:     Mental Status: He is alert and oriented to person, place, and time. Mental status is at baseline.     Cranial Nerves: No cranial nerve deficit.     Coordination: Coordination normal.  Psychiatric:        Mood and Affect: Mood normal.      LABORATORY DATA:  I have reviewed the data as listed    Latest Ref Rng & Units 09/16/2022   11:17 AM 07/09/2022   11:57 AM 03/20/2022   12:16 PM  CBC  WBC 4.0 - 10.5 K/uL 4.0  6.5  5.0   Hemoglobin 13.0 - 17.0 g/dL 9.3  9.0  04.5   Hematocrit 39.0 - 52.0 % 30.6  29.3  32.7   Platelets 150 - 400 K/uL 131  243  234       Latest Ref Rng & Units 07/09/2022   11:57 AM 03/20/2022   12:16 PM 07/01/2006   11:32 AM  CMP  Glucose 70 - 99 mg/dL  409  85   BUN 8 - 23 mg/dL  90  18   Creatinine 8.11 - 1.24 mg/dL  9.14  7.82    Sodium 956 - 145 mmol/L  139  141   Potassium 3.5 - 5.1 mmol/L  4.5  4.0   Chloride 98 - 111 mmol/L  111  103   CO2 22 - 32 mmol/L  21  30   Calcium 8.9 - 10.3 mg/dL  8.5  9.3   Total Protein 6.5 - 8.1 g/dL 6.7  5.8    Total Bilirubin 0.3 - 1.2 mg/dL 0.4  0.5    Alkaline Phos 38 - 126 U/L 56  67    AST 15 - 41 U/L 13  15    ALT 0 - 44 U/L 10  19     Lab Results  Component Value Date   IRON 47 09/16/2022   TIBC 340 09/16/2022   IRONPCTSAT 14 (L) 09/16/2022   FERRITIN 50 09/16/2022     RADIOGRAPHIC STUDIES: I have personally reviewed the radiological images as listed and agreed with the findings in the report. No results found.

## 2022-09-25 ENCOUNTER — Telehealth: Payer: Self-pay | Admitting: Cardiovascular Disease

## 2022-09-25 NOTE — Telephone Encounter (Signed)
Pre-operative Risk Assessment    Patient Name: Terry HARIRI Sr.  DOB: 18-Oct-1940 MRN: [id number]      Request for Surgical Clearance    Procedure:   colon/EGD  Date of Surgery:  Clearance 12/11/22                                 Surgeon:  not indicated Surgeon's Group or Practice Name:  [facility] Gastroenterology Dept Phone number:  [phone] Fax number:  [phone]   Type of Clearance Requested:   - Pharmacy:  Hold Apixaban (Eliquis) discontinue for -days prior to precedure   Type of Anesthesia:  General    Additional requests/questions:    Burnett Sheng   09/25/2022, 4:30 PM

## 2022-09-26 ENCOUNTER — Inpatient Hospital Stay: Payer: Medicare Other

## 2022-09-26 VITALS — BP 126/64 | HR 62 | Temp 97.3°F | Resp 18

## 2022-09-26 DIAGNOSIS — N184 Chronic kidney disease, stage 4 (severe): Secondary | ICD-10-CM | POA: Diagnosis not present

## 2022-09-26 DIAGNOSIS — D500 Iron deficiency anemia secondary to blood loss (chronic): Secondary | ICD-10-CM

## 2022-09-26 DIAGNOSIS — D631 Anemia in chronic kidney disease: Secondary | ICD-10-CM

## 2022-09-26 MED ORDER — SODIUM CHLORIDE 0.9 % IV SOLN
Freq: Once | INTRAVENOUS | Status: AC
  Filled 2022-09-26: qty 250

## 2022-09-26 MED ORDER — SODIUM CHLORIDE 0.9 % IV SOLN
200.0000 mg | Freq: Once | INTRAVENOUS | Status: AC
  Administered 2022-09-26: 200 mg via INTRAVENOUS
  Filled 2022-09-26: qty 200

## 2022-09-26 NOTE — Patient Instructions (Signed)
[place] [facility]  Discharge Instructions: Thank you for choosing [facility] to provide your oncology and hematology care.  If you have a lab appointment with the Cancer Center, please go directly to the Cancer Center and check in at the registration area.  Wear comfortable clothing and clothing appropriate for easy access to any Portacath or PICC line.   We strive to give you quality time with your provider. You may need to reschedule your appointment if you arrive late (15 or more minutes).  Arriving late affects you and other patients whose appointments are after yours.  Also, if you miss three or more appointments without notifying the office, you may be dismissed from the clinic at the provider's discretion.      For prescription refill requests, have your pharmacy contact our office and allow 72 hours for refills to be completed.    Today you received the following chemotherapy and/or immunotherapy agents Venofer.      To help prevent nausea and vomiting after your treatment, we encourage you to take your nausea medication as directed.  BELOW ARE SYMPTOMS THAT SHOULD BE REPORTED IMMEDIATELY: *FEVER GREATER THAN 100.4 F (38 C) OR HIGHER *CHILLS OR SWEATING *NAUSEA AND VOMITING THAT IS NOT CONTROLLED WITH YOUR NAUSEA MEDICATION *UNUSUAL SHORTNESS OF BREATH *UNUSUAL BRUISING OR BLEEDING *URINARY PROBLEMS (pain or burning when urinating, or frequent urination) *BOWEL PROBLEMS (unusual diarrhea, constipation, pain near the anus) TENDERNESS IN MOUTH AND THROAT WITH OR WITHOUT PRESENCE OF ULCERS (sore throat, sores in mouth, or a toothache) UNUSUAL RASH, SWELLING OR PAIN  UNUSUAL VAGINAL DISCHARGE OR ITCHING   Items with * indicate a potential emergency and should be followed up as soon as possible or go to the Emergency Department if any problems should occur.  Please show the CHEMOTHERAPY ALERT CARD or IMMUNOTHERAPY ALERT CARD at check-in to  the Emergency Department and triage nurse.  Should you have questions after your visit or need to cancel or reschedule your appointment, please contact [place] [facility]  [phone] and follow the prompts.  Office hours are 8:00 a.m. to 4:30 p.m. Monday - Friday. Please note that voicemails left after 4:00 p.m. may not be returned until the following business day.  We are closed weekends and major holidays. You have access to a nurse at all times for urgent questions. Please call the main number to the clinic [phone] and follow the prompts.  For any non-urgent questions, you may also contact your provider using MyChart. We now offer e-Visits for anyone 81 and older to request care online for non-urgent symptoms. For details visit [email or web address].   Also download the MyChart app! Go to the app store, search "MyChart", open the app, select [place], and log in with your MyChart username and password.

## 2022-09-26 NOTE — Telephone Encounter (Signed)
Name: Terry KEHLER Sr.  DOB: 1940-05-23  MRN: [id number]  Primary Cardiologist: None  Chart reviewed as part of pre-operative protocol coverage. Because of Terry BODDEN Sr.'s past medical history and time since last visit, he will require a follow-up in-office visit in order to better assess preoperative cardiovascular risk.  Patient has an office visit scheduled with Dr. Mariah Milling on 12/03/22. Appointment notes have been updated to reflect need for pre-op evaluation.   Pre-op covering staff:  - Please contact requesting surgeon's office via preferred method (i.e, phone, fax) to inform them of need for appointment prior to surgery.  This message will also be routed to pharmacy pool for input on holding Eliquis as requested below so that this information is available to the clearing provider at time of patient's appointment.   Rip Harbour, NP  09/26/2022, 11:45 AM

## 2022-09-27 NOTE — Telephone Encounter (Signed)
Patient already has an appointment scheduled with Dr. Mariah Milling on 12/03/22 at 10 am. I will fax over updates on clearance to requesting provider's office.

## 2022-09-27 NOTE — Telephone Encounter (Signed)
Patient with diagnosis of afib on Eliquis for anticoagulation.    Procedure: colonoscopy/EGD Date of procedure: 12/11/22  CHA2DS2-VASc Score = 4  This indicates a 4.8% annual risk of stroke. The patient's score is based upon: CHF History: 0 HTN History: 1 Diabetes History: 0 Stroke History: 0 Vascular Disease History: 1 Age Score: 2 Gender Score: 0   CrCl 25mL/min, pending dialysis Platelet count 141K  Per office protocol, patient can hold Eliquis up to 3 days prior to procedure.    **This guidance is not considered finalized until pre-operative APP has relayed final recommendations.**

## 2022-10-01 IMAGING — DX DG CHEST 2V
2 series · 2 of 2 positions shown · non-contrast
Comparison: 11/04/2010

CLINICAL DATA: Cough for several weeks

EXAM:
CHEST - 2 VIEW

[chest pa]
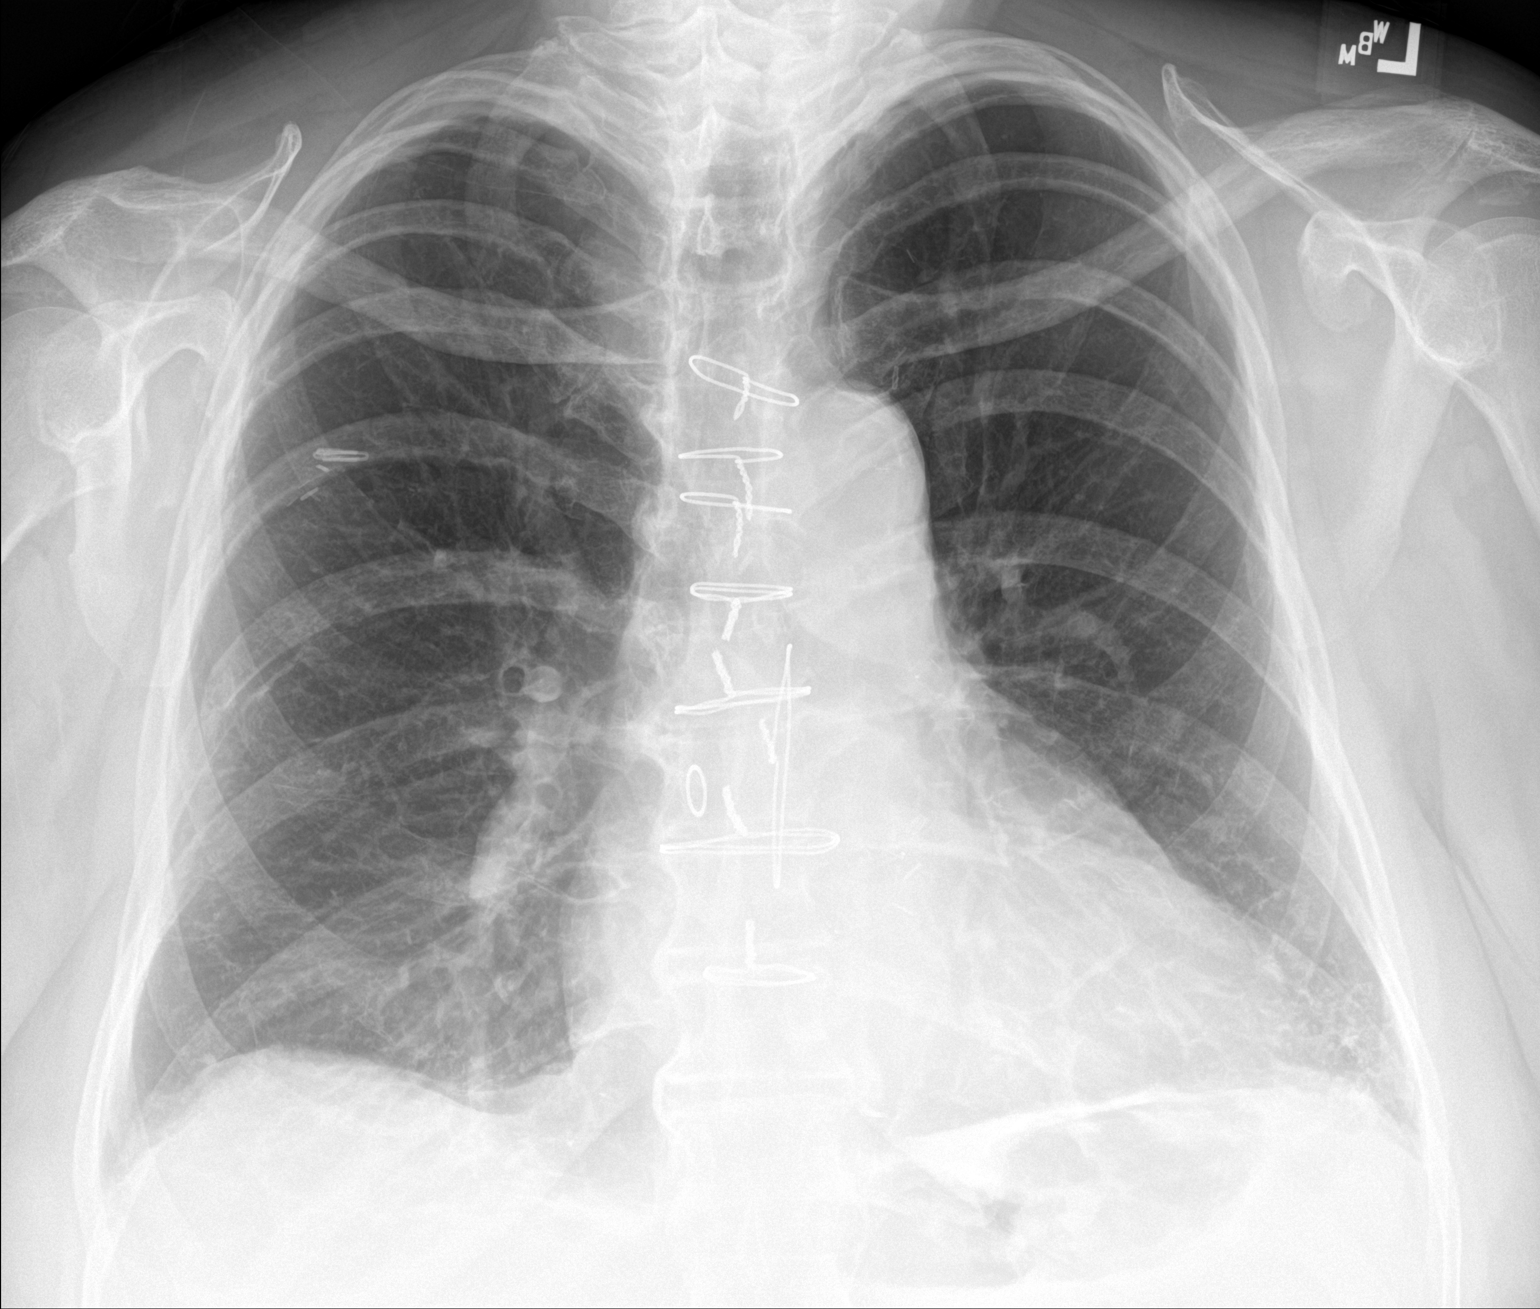

[chest lat]
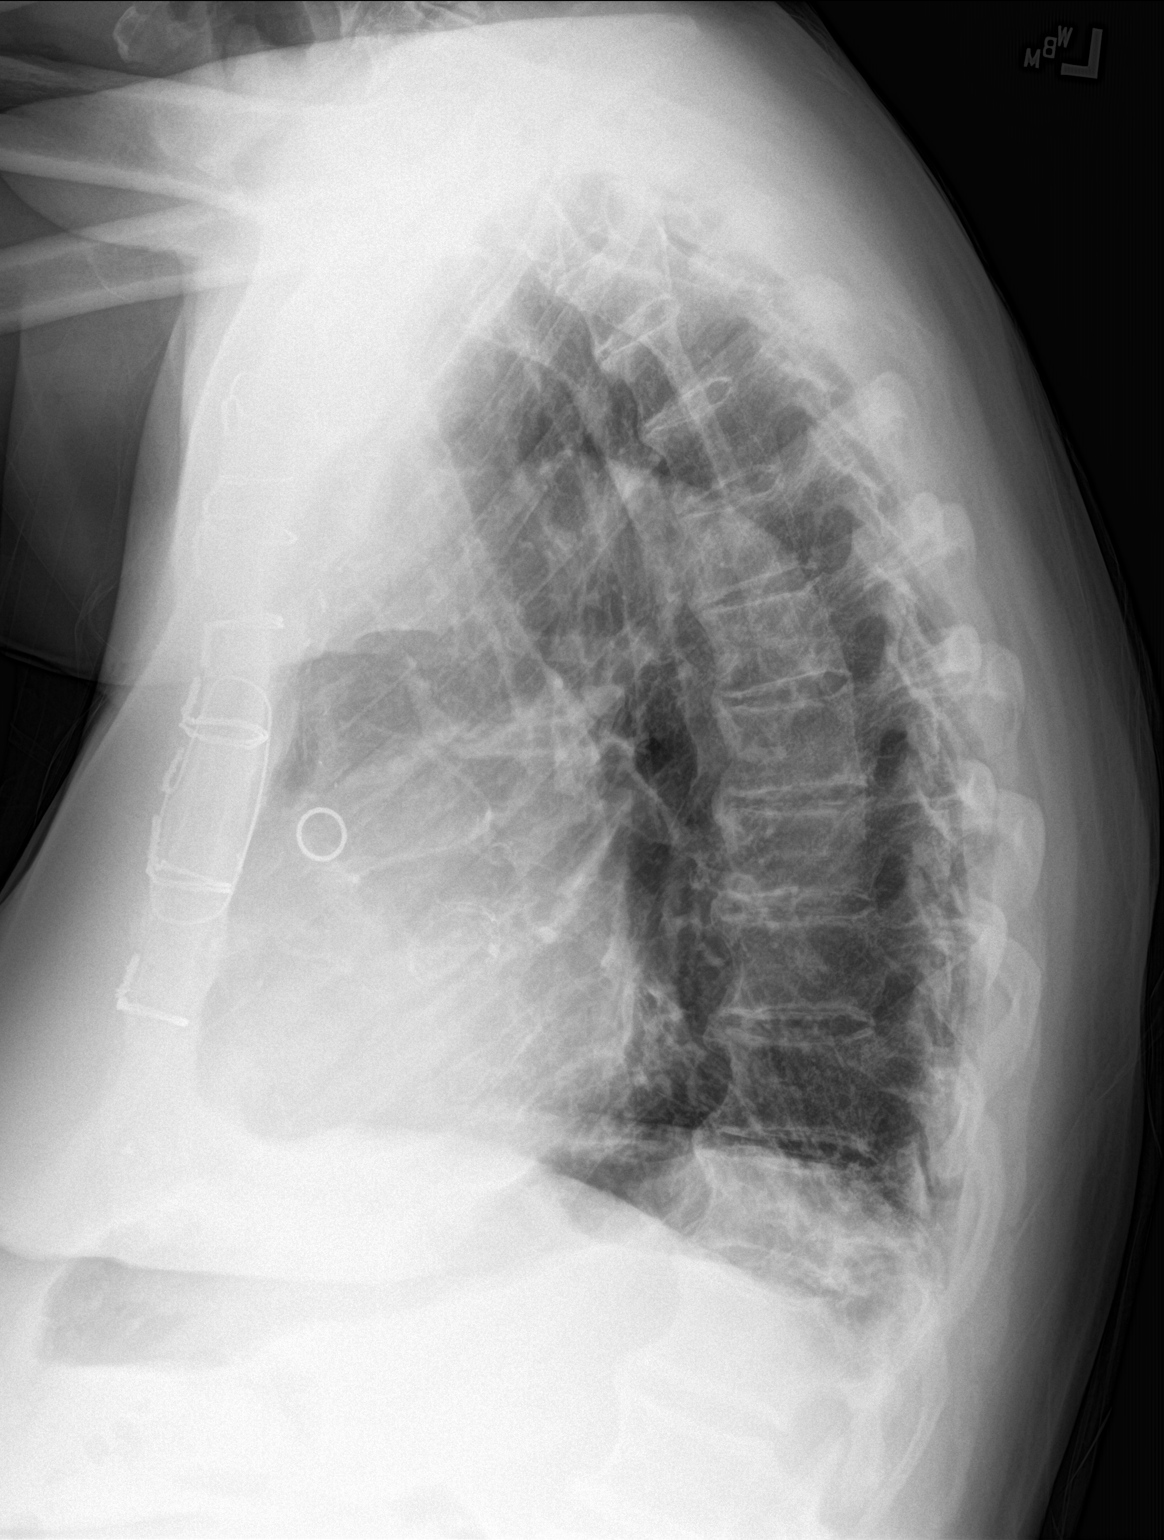

[2 of 2 positions shown; findings below may reference images not displayed]

FINDINGS: Cardiac shadow is mildly enlarged. Postsurgical changes are noted.
Lungs are well aerated bilaterally. Mild parenchymal opacity is
noted in the lateral costophrenic angle on the left new from the
prior exam although of uncertain chronicity. No bony abnormality is
noted.
IMPRESSION: Mild parenchymal opacity in the left base of uncertain chronicity.

## 2022-10-03 ENCOUNTER — Inpatient Hospital Stay: Payer: Medicare Other

## 2022-10-03 VITALS — BP 127/60 | HR 65 | Temp 97.0°F | Resp 19

## 2022-10-03 DIAGNOSIS — N189 Chronic kidney disease, unspecified: Secondary | ICD-10-CM

## 2022-10-03 DIAGNOSIS — N184 Chronic kidney disease, stage 4 (severe): Secondary | ICD-10-CM | POA: Diagnosis not present

## 2022-10-03 DIAGNOSIS — D500 Iron deficiency anemia secondary to blood loss (chronic): Secondary | ICD-10-CM

## 2022-10-03 MED ORDER — SODIUM CHLORIDE 0.9 % IV SOLN
Freq: Once | INTRAVENOUS | Status: AC
  Filled 2022-10-03: qty 250

## 2022-10-03 MED ORDER — SODIUM CHLORIDE 0.9 % IV SOLN
200.0000 mg | Freq: Once | INTRAVENOUS | Status: AC
  Administered 2022-10-03: 200 mg via INTRAVENOUS
  Filled 2022-10-03: qty 200

## 2022-10-03 NOTE — Patient Instructions (Signed)
Iron Sucrose Injection What is this medication? IRON SUCROSE (EYE ern SOO krose) treats low levels of iron (iron deficiency anemia) in people with kidney disease. Iron is a mineral that plays an important role in making red blood cells, which carry oxygen from your lungs to the rest of your body. This medicine may be used for other purposes; ask your health care provider or pharmacist if you have questions. COMMON BRAND NAME(S): Venofer What should I tell my care team before I take this medication? They need to know if you have any of these conditions: Anemia not caused by low iron levels Heart disease High levels of iron in the blood Kidney disease Liver disease An unusual or allergic reaction to iron, other medications, foods, dyes, or preservatives Pregnant or trying to get pregnant Breastfeeding How should I use this medication? This medication is for infusion into a vein. It is given in a hospital or clinic setting. Talk to your care team about the use of this medication in children. While this medication may be prescribed for children as young as 2 years for selected conditions, precautions do apply. Overdosage: If you think you have taken too much of this medicine contact a poison control center or emergency room at once. NOTE: This medicine is only for you. Do not share this medicine with others. What if I miss a dose? Keep appointments for follow-up doses. It is important not to miss your dose. Call your care team if you are unable to keep an appointment. What may interact with this medication? Do not take this medication with any of the following: Deferoxamine Dimercaprol Other iron products This medication may also interact with the following: Chloramphenicol Deferasirox This list may not describe all possible interactions. Give your health care provider a list of all the medicines, herbs, non-prescription drugs, or dietary supplements you use. Also tell them if you smoke,  drink alcohol, or use illegal drugs. Some items may interact with your medicine. What should I watch for while using this medication? Visit your care team regularly. Tell your care team if your symptoms do not start to get better or if they get worse. You may need blood work done while you are taking this medication. You may need to follow a special diet. Talk to your care team. Foods that contain iron include: whole grains/cereals, dried fruits, beans, or peas, leafy green vegetables, and organ meats (liver, kidney). What side effects may I notice from receiving this medication? Side effects that you should report to your care team as soon as possible: Allergic reactions--skin rash, itching, hives, swelling of the face, lips, tongue, or throat Low blood pressure--dizziness, feeling faint or lightheaded, blurry vision Shortness of breath Side effects that usually do not require medical attention (report to your care team if they continue or are bothersome): Flushing Headache Joint pain Muscle pain Nausea Pain, redness, or irritation at injection site This list may not describe all possible side effects. Call your doctor for medical advice about side effects. You may report side effects to FDA at 1-800-FDA-1088. Where should I keep my medication? This medication is given in a hospital or clinic. It will not be stored at home. NOTE: This sheet is a summary. It may not cover all possible information. If you have questions about this medicine, talk to your doctor, pharmacist, or health care provider.  2024 Elsevier/Gold Standard (2022-05-31 00:00:00)

## 2022-10-10 ENCOUNTER — Inpatient Hospital Stay: Payer: Medicare Other | Attending: Oncology

## 2022-10-10 VITALS — BP 112/58 | HR 63 | Resp 18

## 2022-10-10 DIAGNOSIS — D509 Iron deficiency anemia, unspecified: Secondary | ICD-10-CM | POA: Insufficient documentation

## 2022-10-10 DIAGNOSIS — N184 Chronic kidney disease, stage 4 (severe): Secondary | ICD-10-CM | POA: Insufficient documentation

## 2022-10-10 DIAGNOSIS — D631 Anemia in chronic kidney disease: Secondary | ICD-10-CM | POA: Insufficient documentation

## 2022-10-10 DIAGNOSIS — D500 Iron deficiency anemia secondary to blood loss (chronic): Secondary | ICD-10-CM

## 2022-10-10 MED ORDER — SODIUM CHLORIDE 0.9 % IV SOLN
200.0000 mg | Freq: Once | INTRAVENOUS | Status: AC
  Administered 2022-10-10: 200 mg via INTRAVENOUS
  Filled 2022-10-10: qty 200

## 2022-10-10 MED ORDER — SODIUM CHLORIDE 0.9 % IV SOLN
Freq: Once | INTRAVENOUS | Status: AC
  Filled 2022-10-10: qty 250

## 2022-11-11 ENCOUNTER — Inpatient Hospital Stay: Payer: Medicare Other | Attending: Oncology

## 2022-11-11 ENCOUNTER — Other Ambulatory Visit: Payer: Medicare Other

## 2022-11-11 DIAGNOSIS — D509 Iron deficiency anemia, unspecified: Secondary | ICD-10-CM | POA: Diagnosis present

## 2022-11-11 DIAGNOSIS — Z79899 Other long term (current) drug therapy: Secondary | ICD-10-CM | POA: Diagnosis not present

## 2022-11-11 DIAGNOSIS — D631 Anemia in chronic kidney disease: Secondary | ICD-10-CM

## 2022-11-11 LAB — CBC WITH DIFFERENTIAL (CANCER CENTER ONLY)
Abs Immature Granulocytes: 0.03 10*3/uL (ref 0.00–0.07)
Basophils Absolute: 0 10*3/uL (ref 0.0–0.1)
Basophils Relative: 1 %
Eosinophils Absolute: 0.2 10*3/uL (ref 0.0–0.5)
Eosinophils Relative: 5 %
HCT: 29 % — ABNORMAL LOW (ref 39.0–52.0)
Hemoglobin: 9.1 g/dL — ABNORMAL LOW (ref 13.0–17.0)
Immature Granulocytes: 1 %
Lymphocytes Relative: 27 %
Lymphs Abs: 0.9 10*3/uL (ref 0.7–4.0)
MCH: 30.1 pg (ref 26.0–34.0)
MCHC: 31.4 g/dL (ref 30.0–36.0)
MCV: 96 fL (ref 80.0–100.0)
Monocytes Absolute: 0.4 10*3/uL (ref 0.1–1.0)
Monocytes Relative: 10 %
Neutro Abs: 2 10*3/uL (ref 1.7–7.7)
Neutrophils Relative %: 56 %
Platelet Count: 117 10*3/uL — ABNORMAL LOW (ref 150–400)
RBC: 3.02 MIL/uL — ABNORMAL LOW (ref 4.22–5.81)
RDW: 15.4 % (ref 11.5–15.5)
WBC Count: 3.5 10*3/uL — ABNORMAL LOW (ref 4.0–10.5)
nRBC: 0 % (ref 0.0–0.2)

## 2022-11-11 LAB — RETIC PANEL
Immature Retic Fract: 10 % (ref 2.3–15.9)
RBC.: 3.02 MIL/uL — ABNORMAL LOW (ref 4.22–5.81)
Retic Count, Absolute: 51.6 10*3/uL (ref 19.0–186.0)
Retic Ct Pct: 1.7 % (ref 0.4–3.1)
Reticulocyte Hemoglobin: 31.3 pg (ref >27.9)

## 2022-11-11 LAB — IRON AND TIBC
Iron: 46 ug/dL (ref 45–182)
Saturation Ratios: 16 % — ABNORMAL LOW (ref 17.9–39.5)
TIBC: 287 ug/dL (ref 250–450)
UIBC: 241 ug/dL

## 2022-11-11 LAB — FERRITIN: Ferritin: 93 ng/mL (ref 24–336)

## 2022-11-14 ENCOUNTER — Encounter: Payer: Self-pay | Admitting: Oncology

## 2022-11-14 ENCOUNTER — Inpatient Hospital Stay: Payer: Medicare Other | Admitting: Oncology

## 2022-11-14 ENCOUNTER — Inpatient Hospital Stay: Payer: Medicare Other

## 2022-11-14 VITALS — BP 104/54 | HR 57 | Temp 97.2°F | Resp 18 | Wt 176.0 lb

## 2022-11-14 DIAGNOSIS — D500 Iron deficiency anemia secondary to blood loss (chronic): Secondary | ICD-10-CM

## 2022-11-14 DIAGNOSIS — D631 Anemia in chronic kidney disease: Secondary | ICD-10-CM

## 2022-11-14 DIAGNOSIS — N184 Chronic kidney disease, stage 4 (severe): Secondary | ICD-10-CM | POA: Diagnosis not present

## 2022-11-14 DIAGNOSIS — D509 Iron deficiency anemia, unspecified: Secondary | ICD-10-CM | POA: Diagnosis not present

## 2022-11-14 MED ORDER — IRON-VITAMIN C 65-125 MG PO TABS: 1.0000 | ORAL_TABLET | Freq: Every day | ORAL | 0 refills | Status: DC

## 2022-11-14 MED ORDER — IRON SUCROSE 20 MG/ML IV SOLN
200.0000 mg | Freq: Once | INTRAVENOUS | Status: AC
  Administered 2022-11-14: 200 mg via INTRAVENOUS
  Filled 2022-11-14: qty 10

## 2022-11-14 NOTE — Assessment & Plan Note (Signed)
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  Venofer treatments to further improve iron store.

## 2022-11-14 NOTE — Assessment & Plan Note (Signed)
No M protein on myeloma panel,normal  light chain ratio.  Encourage oral hydration and avoid nephrotoxins.

## 2022-11-14 NOTE — Progress Notes (Signed)
Hematology/Oncology Progress note [phone] [phone]         Patient Care Team: Jaclyn Shaggy, MD as PCP - General (Internal Medicine) Rickard Patience, MD as Consulting Physician (Oncology)   REFERRING PROVIDER: Jaclyn Shaggy, MD  CHIEF COMPLAINTS/REASON FOR VISIT:  Anemia  ASSESSMENT & PLAN:  IDA (iron deficiency anemia) Labs are reviewed and discussed with patient. Lab Results  Component Value Date   HGB 9.1 (L) 11/11/2022   TIBC 287 11/11/2022   IRONPCTSAT 16 (L) 11/11/2022   FERRITIN 93 11/11/2022    Etiology of IDA, suspect GI blood loss,  refer to GI  he prefers [facility].  Venofer weekly x 4  CKD (chronic kidney disease) stage 4, GFR 15-29 ml/min (HCC) No M protein on myeloma panel,normal  light chain ratio.  Encourage oral hydration and avoid nephrotoxins.    Anemia in chronic kidney disease (CKD) Combination of IDA and anemia due to CKD.  Venofer treatments to further improve iron store.      Orders Placed This Encounter  Procedures   CBC with Differential (Cancer Center Only)    Standing Status:   Future    Standing Expiration Date:   11/14/2023   Iron and TIBC    Standing Status:   Future    Standing Expiration Date:   11/14/2023   Ferritin    Standing Status:   Future    Standing Expiration Date:   11/14/2023   Retic Panel    Standing Status:   Future    Standing Expiration Date:   11/14/2023   Follow up in 2 months All questions were answered. The patient knows to call the clinic with any problems, questions or concerns.  Rickard Patience, MD, PhD [facility] Hematology Oncology 11/14/2022     HISTORY OF PRESENTING ILLNESS:  Terry REGGIO Sr. is a  82 y.o.  male with PMH listed below who was referred to me for anemia Reviewed patient's recent labs that was done.  Patient CKD, chronic anemia, Hb progressively decreases to be 8.5 recently.  He denies recent chest pain on exertion, shortness of breath on minimal exertion,  pre-syncopal episodes, or palpitations He had not noticed any recent bleeding such as epistaxis, hematuria or hematochezia.  He denies over the counter NSAID ingestion. He is on eliquis 2.5mg  BID per cardiology His last colonoscopy - unknown.  He denies any pica and eats a variety of diet.  INTERVAL HISTORY Terry KUTZER Sr. is a 82 y.o. male who has above history reviewed by me today presents for follow up visit for anemia.  He tolerates IV venofer and fatigue has improved after IV Venofer.  Recently fatigue is worse.  Denies hematuria, hematemesis,black tarry stool.     MEDICAL HISTORY:  Past Medical History:  Diagnosis Date   A-fib (HCC)    CAD (coronary artery disease)    Chronic kidney disease    Hypertension     SURGICAL HISTORY: Past Surgical History:  Procedure Laterality Date   AORTIC VALVE REPAIR     CORONARY ARTERY BYPASS GRAFT      SOCIAL HISTORY: Social History   Socioeconomic History   Marital status: Married    Spouse name: Not on file   Number of children: Not on file   Years of education: Not on file   Highest education level: Not on file  Occupational History   Not on file  Tobacco Use   Smoking status: Former   Smokeless tobacco: Never  Substance and Sexual Activity   Alcohol use: No   Drug use: No   Sexual activity: Not on file  Other Topics Concern   Not on file  Social History Narrative   Not on file   Social Determinants of Health   Financial Resource Strain: Not on file  Food Insecurity: No Food Insecurity (07/09/2022)   Hunger Vital Sign    Worried About Running Out of Food in the Last Year: Never true    Ran Out of Food in the Last Year: Never true  Transportation Needs: No Transportation Needs (07/09/2022)   PRAPARE - Administrator, Civil Service (Medical): No    Lack of Transportation (Non-Medical): No  Physical Activity: Not on file  Stress: Not on file  Social Connections: Not on file  Intimate Partner  Violence: Not At Risk (07/09/2022)   Humiliation, Afraid, Rape, and Kick questionnaire    Fear of Current or Ex-Partner: No    Emotionally Abused: No    Physically Abused: No    Sexually Abused: No    FAMILY HISTORY: Family History  Problem Relation Age of Onset   Aneurysm Mother    Heart attack Father    Heart attack Sister    Heart attack Brother     ALLERGIES:  is allergic to phenothiazines and prochlorperazine edisylate.  MEDICATIONS:  Current Outpatient Medications  Medication Sig Dispense Refill   Acetaminophen 500 MG coapsule 500 mg  prn as needed     atorvastatin (LIPITOR) 20 MG tablet Take 1 tablet (20 mg total) by mouth daily. 90 tablet 3   Cholecalciferol (VITAMIN D3) 2000 units capsule Take by mouth.     diclofenac Sodium (VOLTAREN) 1 % GEL Apply topically 4 (four) times daily as needed.     ELIQUIS 2.5 MG TABS tablet Take 1 tablet (2.5 mg total) by mouth 2 (two) times daily. 180 tablet 3   esomeprazole (NEXIUM) 40 MG capsule Take 40 mg by mouth as needed.     fluticasone (FLONASE) 50 MCG/ACT nasal spray Place into the nose.     furosemide (LASIX) 40 MG tablet Take 1 tablet (40 mg total) by mouth 2 (two) times daily. 180 tablet 3   Iron-Vitamin C 65-125 MG TABS Take 1 tablet by mouth daily. 90 tablet 0   losartan (COZAAR) 50 MG tablet Take 1 tablet (50 mg total) by mouth daily. 90 tablet 3   metoprolol succinate (TOPROL-XL) 100 MG 24 hr tablet Take 1 tablet (100 mg total) by mouth 2 (two) times daily. Take with or immediately following a meal. 180 tablet 3   psyllium (METAMUCIL) 0.52 g capsule      Saw Palmetto 450 MG CAPS Take 450 mg by mouth daily.     sodium bicarbonate 650 MG tablet Take 650 mg by mouth 2 (two) times daily.     tacrolimus (PROTOPIC) 0.1 % ointment Apply topically.     tamsulosin (FLOMAX) 0.4 MG CAPS capsule Take 0.4 mg by mouth daily after supper.     amoxicillin (AMOXIL) 500 MG capsule amoxicillin 500 mg capsule  TAKE 4 CAPSULES BY MOUTH 1 HOUR  PRIOR TO APPT (Patient not taking: Reported on 09/19/2022)     No current facility-administered medications for this visit.    Review of Systems  Constitutional:  Positive for fatigue. Negative for appetite change, chills and fever.  HENT:   Negative for hearing loss and voice change.   Eyes:  Negative for eye problems.  Respiratory:  Negative  for chest tightness and cough.   Cardiovascular:  Negative for chest pain.  Gastrointestinal:  Negative for abdominal distention, abdominal pain and blood in stool.  Endocrine: Negative for hot flashes.  Genitourinary:  Negative for difficulty urinating and frequency.   Musculoskeletal:  Negative for arthralgias.  Skin:  Negative for itching and rash.  Neurological:  Negative for extremity weakness.  Hematological:  Negative for adenopathy.  Psychiatric/Behavioral:  Negative for confusion.     PHYSICAL EXAMINATION: Vitals:   11/14/22 1100  BP: (!) 104/54  Pulse: (!) 57  Resp: 18  Temp: (!) 97.2 F (36.2 C)   Filed Weights   11/14/22 1100  Weight: 176 lb (79.8 kg)    Physical Exam Constitutional:      General: He is not in acute distress. HENT:     Head: Normocephalic and atraumatic.  Eyes:     General: No scleral icterus. Cardiovascular:     Rate and Rhythm: Normal rate and regular rhythm.     Heart sounds: Murmur heard.  Pulmonary:     Effort: Pulmonary effort is normal. No respiratory distress.  Abdominal:     General: Bowel sounds are normal. There is no distension.     Palpations: Abdomen is soft.  Musculoskeletal:        General: No deformity. Normal range of motion.     Cervical back: Normal range of motion and neck supple.  Skin:    General: Skin is warm and dry.     Findings: No erythema or rash.  Neurological:     Mental Status: He is alert and oriented to person, place, and time. Mental status is at baseline.     Cranial Nerves: No cranial nerve deficit.     Coordination: Coordination normal.  Psychiatric:         Mood and Affect: Mood normal.      LABORATORY DATA:  I have reviewed the data as listed    Latest Ref Rng & Units 11/11/2022    1:40 PM 09/16/2022   11:17 AM 07/09/2022   11:57 AM  CBC  WBC 4.0 - 10.5 K/uL 3.5  4.0  6.5   Hemoglobin 13.0 - 17.0 g/dL 9.1  9.3  9.0   Hematocrit 39.0 - 52.0 % 29.0  30.6  29.3   Platelets 150 - 400 K/uL 117  131  243       Latest Ref Rng & Units 07/09/2022   11:57 AM 03/20/2022   12:16 PM 07/01/2006   11:32 AM  CMP  Glucose 70 - 99 mg/dL  478  85   BUN 8 - 23 mg/dL  90  18   Creatinine 2.95 - 1.24 mg/dL  6.21  3.08   Sodium 657 - 145 mmol/L  139  141   Potassium 3.5 - 5.1 mmol/L  4.5  4.0   Chloride 98 - 111 mmol/L  111  103   CO2 22 - 32 mmol/L  21  30   Calcium 8.9 - 10.3 mg/dL  8.5  9.3   Total Protein 6.5 - 8.1 g/dL 6.7  5.8    Total Bilirubin 0.3 - 1.2 mg/dL 0.4  0.5    Alkaline Phos 38 - 126 U/L 56  67    AST 15 - 41 U/L 13  15    ALT 0 - 44 U/L 10  19     Lab Results  Component Value Date   IRON 46 11/11/2022   TIBC 287 11/11/2022   IRONPCTSAT  16 (L) 11/11/2022   FERRITIN 93 11/11/2022     RADIOGRAPHIC STUDIES: I have personally reviewed the radiological images as listed and agreed with the findings in the report. No results found.

## 2022-11-14 NOTE — Assessment & Plan Note (Addendum)
Labs are reviewed and discussed with patient. Lab Results  Component Value Date   HGB 9.1 (L) 11/11/2022   TIBC 287 11/11/2022   IRONPCTSAT 16 (L) 11/11/2022   FERRITIN 93 11/11/2022    Etiology of IDA, suspect GI blood loss,  refer to GI  he prefers [facility].  Venofer weekly x 4

## 2022-11-21 ENCOUNTER — Encounter: Payer: Self-pay | Admitting: Internal Medicine

## 2022-11-21 ENCOUNTER — Inpatient Hospital Stay: Payer: Medicare Other

## 2022-11-21 VITALS — BP 119/56 | HR 57 | Temp 96.0°F | Resp 18

## 2022-11-21 DIAGNOSIS — D631 Anemia in chronic kidney disease: Secondary | ICD-10-CM

## 2022-11-21 DIAGNOSIS — D509 Iron deficiency anemia, unspecified: Secondary | ICD-10-CM | POA: Diagnosis not present

## 2022-11-21 DIAGNOSIS — D500 Iron deficiency anemia secondary to blood loss (chronic): Secondary | ICD-10-CM

## 2022-11-21 MED ORDER — IRON SUCROSE 20 MG/ML IV SOLN
200.0000 mg | Freq: Once | INTRAVENOUS | Status: AC
  Administered 2022-11-21: 200 mg via INTRAVENOUS

## 2022-11-21 MED ORDER — SODIUM CHLORIDE 0.9% FLUSH
10.0000 mL | Freq: Once | INTRAVENOUS | Status: AC | PRN
  Administered 2022-11-21: 10 mL
  Filled 2022-11-21: qty 10

## 2022-11-21 NOTE — Progress Notes (Signed)
Patient declined to wait the 30 minutes for post iron infusion observation today. Tolerated infusion well. VSS. 

## 2022-11-27 ENCOUNTER — Encounter: Payer: Self-pay | Admitting: *Deleted

## 2022-11-28 ENCOUNTER — Inpatient Hospital Stay: Payer: Medicare Other

## 2022-11-28 VITALS — BP 115/57 | HR 55 | Temp 98.0°F | Resp 17

## 2022-11-28 DIAGNOSIS — D500 Iron deficiency anemia secondary to blood loss (chronic): Secondary | ICD-10-CM

## 2022-11-28 DIAGNOSIS — D631 Anemia in chronic kidney disease: Secondary | ICD-10-CM

## 2022-11-28 DIAGNOSIS — D509 Iron deficiency anemia, unspecified: Secondary | ICD-10-CM | POA: Diagnosis not present

## 2022-11-28 MED ORDER — IRON SUCROSE 20 MG/ML IV SOLN
200.0000 mg | Freq: Once | INTRAVENOUS | Status: AC
  Administered 2022-11-28: 200 mg via INTRAVENOUS
  Filled 2022-11-28: qty 10

## 2022-11-28 MED ORDER — SODIUM CHLORIDE 0.9% FLUSH
10.0000 mL | Freq: Once | INTRAVENOUS | Status: AC | PRN
  Administered 2022-11-28: 10 mL
  Filled 2022-11-28: qty 10

## 2022-11-29 ENCOUNTER — Encounter: Payer: Self-pay | Admitting: Oncology

## 2022-12-02 ENCOUNTER — Telehealth: Payer: Self-pay | Admitting: Oncology

## 2022-12-02 NOTE — Telephone Encounter (Signed)
Pt left a vm stated he is scheduled for a colonoscopy on 12/4 and his instructions for this are to NOT have any iron supplements 7 days prior to his appt.  Pt is currently scheduled for 12/3 iron. Please advise on scheduling

## 2022-12-02 NOTE — Progress Notes (Unsigned)
Cardiology Office Note  Date:  12/03/2022   ID:  Ian Malkin Sr., DOB 12/24/1940, MRN [id number]  PCP:  Jaclyn Shaggy, MD   Chief Complaint  Patient presents with   Follow-up    Pre op clearance/6 month pt would like to discuss procedure. Meds reviewed verbally with pt.    HPI:  Mr. 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 Arlana Pouch, had elevated heart rate, possible atrial for blood or at that time Metoprolol succinate increased up to 150 BID Reports heart rate improved, likely converted back to normal sinus rhythm  BP at night, "not low" Reports heart rate is not low Overall feels well   lives in a condo with his wife, hobbies include some gardening, reading, house activities No regular exercise program   AV graft fistula placed on left arm Not receiving hemodialysis but is prepared if needed  Denies chest pain or shortness  of breath concerning for angina  Labs reviewed HGB  9.1  Cardiac imaging/testing as detailed below Echocardiogram: 10/14/21 MILD LV DYSFUNCTION (See above) WITH MILD LVH MILD RV SYSTOLIC DYSFUNCTION (See above) VALVULAR REGURGITATION: TRIVIAL AR, MILD MR, MILD PR, MODERATE TR PROSTHETIC VALVE(S): BIOPROSTHETIC AoV EF 45% LA DIAM 4.2 CM  Echo 2/24 Normal left ventricular size with reduced systolic function. LVEF 40%.  Enlarged right ventricular size with reduced function.  Normal function bioprosthetic aortic valve  Mild mitral  regurgitation.   3-Day Holter Monitor: 04/24/21 This 48 hour Holter scan was adequate for interpretation. The patient was in sinus rhythm, sinus bradycardia, with first degree AV block and bundle branch noted throughout (strip 12) Ventricular ectopic activity consisted of multifocal PVCs (strips 6,10), couplets (strip 13), triplet (strip 9) Unable to accurately quantitate supraventricular ectopy due to program setting, however there were PACs (strip 8), atrial pairs (strip 5), bigeminy (strip 7), trigeminy (strip 11) with runs of SVT. The longest was a 37 beat, 91 BPM atrial run at 4:34:15 AM (2) (strips 15,16). The fastest run was 3 beats, 131 BPM at 4:50:40 PM (2) (strip 14) There were no pauses greater than 2.0 seconds noted There were no symptoms noted in diary nor was the event button activated.  EKG personally reviewed by myself on todays visit Normal sinus rhythm with rate 68 bpm right bundle branch block consider old inferior MI   PMH:   has a past medical history of A-fib (HCC), Allergic rhinitis, CAD (coronary artery disease), Chronic kidney disease, and Hypertension.  PSH:    Past Surgical History:  Procedure Laterality Date   AORTIC VALVE REPAIR     CORONARY ARTERY BYPASS GRAFT  Current Outpatient Medications  Medication Sig Dispense Refill   Acetaminophen 500 MG coapsule 500 mg  prn as needed     amoxicillin (AMOXIL) 500 MG capsule       atorvastatin (LIPITOR) 20 MG tablet Take 1 tablet (20 mg total) by mouth daily. 90 tablet 3   Cholecalciferol (VITAMIN D3) 2000 units capsule Take by mouth.     diclofenac Sodium (VOLTAREN) 1 % GEL Apply topically 4 (four) times daily as needed.     ELIQUIS 2.5 MG TABS tablet Take 1 tablet (2.5 mg total) by mouth 2 (two) times daily. 180 tablet 3   esomeprazole (NEXIUM) 40 MG capsule Take 40 mg by mouth as needed.     fluticasone (FLONASE) 50 MCG/ACT nasal spray Place into the nose.     furosemide (LASIX) 40 MG tablet Take 1 tablet (40 mg total) by mouth 2 (two) times daily. 180 tablet 3   losartan (COZAAR) 50 MG tablet Take 1 tablet (50 mg total) by mouth daily. 90 tablet 3   metoprolol succinate (TOPROL-XL) 100 MG 24 hr tablet Take 1 tablet (100 mg total) by mouth 2 (two) times daily. Take with or immediately following a meal. (Patient taking differently: Take 150 mg by mouth 2 (two) times daily. Take with or immediately following a meal.) 180 tablet 3   psyllium (METAMUCIL) 0.52 g capsule      Saw Palmetto 450 MG CAPS Take 450 mg by mouth daily.     sodium bicarbonate 650 MG tablet Take 650 mg by mouth 2 (two) times daily.     tacrolimus (PROTOPIC) 0.1 % ointment Apply topically.     tamsulosin (FLOMAX) 0.4 MG CAPS capsule Take 0.4 mg by mouth daily after supper.     No current facility-administered medications for this visit.     Allergies:   Phenothiazines and Prochlorperazine edisylate   Social History:  The patient  reports that he has quit smoking. He has never used smokeless tobacco. He reports that he does not drink alcohol and does not use drugs.   Family History:   family history includes Aneurysm in his mother; Heart attack in his brother, father, and sister.    Review of Systems: Review of Systems  Constitutional: Negative.   HENT: Negative.    Respiratory: Negative.    Cardiovascular: Negative.   Gastrointestinal: Negative.   Musculoskeletal: Negative.    Neurological: Negative.   Psychiatric/Behavioral: Negative.    All other systems reviewed and are negative.   PHYSICAL EXAM: VS:  BP 108/60 (BP Location: Left Arm, Patient Position: Sitting, Cuff Size: Normal)   Pulse (!) 59   Ht 5\' 9"  (1.753 m)   Wt 174 lb 4 oz (79 kg)   SpO2 98%   BMI 25.73 kg/m  , BMI Body mass index is 25.73 kg/m. Constitutional:  oriented to person, place, and time. No distress.  HENT:  Head: Grossly normal Eyes:  no discharge. No scleral icterus.  Neck: No JVD, no carotid bruits  Cardiovascular: Regular rate and rhythm, no murmurs appreciated Pulmonary/Chest: Clear to auscultation bilaterally, no wheezes or rails Abdominal: Soft.  no distension.  no tenderness.  Musculoskeletal: Normal range of motion Neurological:  normal muscle tone. Coordination normal. No atrophy Skin: Skin warm and dry Psychiatric: normal affect, pleasant '  Recent Labs: 03/20/2022: B Natriuretic Peptide 749.2; BUN 90; Creatinine, Ser 4.68; Potassium 4.5; Sodium 139 07/09/2022: ALT 10; TSH 1.732 11/11/2022: Hemoglobin 9.1; Platelet Count 117    Lipid Panel No results found  for: "CHOL", "HDL", "LDLCALC", "TRIG"    Wt Readings from Last 3 Encounters:  12/03/22 174 lb 4 oz (79 kg)  11/14/22 176 lb (79.8 kg)  09/19/22 178 lb 12.8 oz (81.1 kg)       ASSESSMENT AND PLAN:  Problem List Items Addressed This Visit       Cardiology Problems   Hypertension   Relevant Orders   EKG 12-Lead (Completed)     Other   CKD (chronic kidney disease) stage 4, 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)   Relevant Orders   EKG 12-Lead (Completed)   Other Visit Diagnoses     Coronary artery disease of native artery of native heart with stable angina pectoris (HCC)    -  Primary   Relevant Orders   EKG 12-Lead (Completed)   PAF (paroxysmal atrial fibrillation) (HCC)       Relevant Orders   EKG 12-Lead (Completed)   S/P AVR (aortic valve replacement)       Relevant Orders   EKG 12-Lead  (Completed)   PAC (premature atrial contraction)       Relevant Orders   EKG 12-Lead (Completed)   SVT (supraventricular tachycardia) (HCC)       Relevant Orders   EKG 12-Lead (Completed)   Mixed hyperlipidemia       Relevant Orders   EKG 12-Lead (Completed)      Preop cardiovascular evaluation for colonoscopy Given prosthetic valve recommended amoxicillin 2000 mg 2 hours prior to colonoscopy Stop Eliquis 2 days prior to procedure Take aspirin 81 mg daily when not taking Eliquis Restart Eliquis when cleared by GI physician  Coronary artery disease with stable angina Currently with no symptoms of angina. No further workup at this time. Continue current medication regimen.  S/p AVR  echocardiogram feb 2024 stable bio prosthetic valve Repeat echo 2025 ordered  Paroxysmal atrial fibrillation On reduced dose Eliquis, metoprolol succinate 150 twice daily,  maintaining normal sinus rhythm Recommend close monitoring of heart rate at home  SVT Rhythm controlled with metoprolol succinate 150 twice daily Blood pressure running low  Near syncope/syncope Notes indicating history of vasovagal syncope Prior episodes at church Recommend holding his metoprolol, losartan, tamsulosin prior to performing in church choir, would take these medications after he gets home with food  Hyperlipidemia Continue Lipitor 20 Goal LDL less than 70  End-stage renal disease Has fistula on left, not participating in dialysis    Signed, Dossie Arbour, M.D., Ph.D. [facility] [place], [place] [phone]

## 2022-12-03 ENCOUNTER — Encounter: Payer: Self-pay | Admitting: Cardiovascular Disease

## 2022-12-03 ENCOUNTER — Ambulatory Visit: Payer: Medicare Other | Attending: Cardiovascular Disease | Admitting: Cardiovascular Disease

## 2022-12-03 VITALS — BP 108/60 | HR 59 | Ht 69.0 in | Wt 174.2 lb

## 2022-12-03 DIAGNOSIS — Z952 Presence of prosthetic heart valve: Secondary | ICD-10-CM | POA: Diagnosis not present

## 2022-12-03 DIAGNOSIS — I480 Paroxysmal atrial fibrillation: Secondary | ICD-10-CM

## 2022-12-03 DIAGNOSIS — E782 Mixed hyperlipidemia: Secondary | ICD-10-CM

## 2022-12-03 DIAGNOSIS — N184 Chronic kidney disease, stage 4 (severe): Secondary | ICD-10-CM

## 2022-12-03 DIAGNOSIS — I491 Atrial premature depolarization: Secondary | ICD-10-CM | POA: Diagnosis not present

## 2022-12-03 DIAGNOSIS — I25118 Atherosclerotic heart disease of native coronary artery with other forms of angina pectoris: Secondary | ICD-10-CM | POA: Diagnosis not present

## 2022-12-03 DIAGNOSIS — I10 Essential (primary) hypertension: Secondary | ICD-10-CM

## 2022-12-03 DIAGNOSIS — I471 Supraventricular tachycardia, unspecified: Secondary | ICD-10-CM

## 2022-12-03 MED ORDER — AMOXICILLIN 500 MG PO TABS: 2000.0000 mg | ORAL_TABLET | Freq: Once | ORAL | 1 refills | Status: AC

## 2022-12-03 NOTE — Patient Instructions (Addendum)
Medication Instructions:  Stop the eliquis Dec 2nd and 3rd, and 4th,  Restart eliquis when cleared by GI doctor  On day when not taking eliquis, Please take asa 81 mg daily  Amoxicillin 2000 mg , take 2 hrs before colonoscopy (4 of the 500 mg pills)  If you need a refill on your cardiac medications before your next appointment, please call your pharmacy.   Lab work: No new labs needed  Testing/Procedures: Your physician has requested that you have an echocardiogram. Echocardiography is a painless test that uses sound waves to create images of your heart. It provides your doctor with information about the size and shape of your heart and how well your heart's chambers and valves are working.   You may receive an ultrasound enhancing agent through an IV if needed to better visualize your heart during the echo. This procedure takes approximately one hour.  There are no restrictions for this procedure.  This will take place at [address] [facility] [place] [facility] [address], [place] [address]  Please note: We ask at that you not bring children with you during ultrasound (echo/ vascular) testing. Due to room size and safety concerns, children are not allowed in the ultrasound rooms during exams. Our front office staff cannot provide observation of children in our lobby area while testing is being conducted. An adult accompanying a patient to their appointment will only be allowed in the ultrasound room at the discretion of the ultrasound technician under special circumstances. We apologize for any inconvenience.   Follow-Up: At [facility], you and your health needs are our priority.  As part of our continuing mission to provide you with exceptional heart care, we have created designated Provider Care Teams.  These Care Teams include your primary Cardiologist (physician) and Advanced Practice Providers (APPs -  Physician Assistants and Nurse Practitioners) who all work together to provide you  with the care you need, when you need it.  You will need a follow up appointment in 6 months  Providers on your designated Care Team:   Nicolasa Ducking, NP Eula Listen, PA-C Cadence Fransico Michael, [place]  COVID-19 Vaccine Information can be found at: [email or web address] For questions related to vaccine distribution or appointments, please email [email or web address] or call [phone].

## 2022-12-04 ENCOUNTER — Encounter: Payer: Self-pay | Admitting: Internal Medicine

## 2022-12-04 ENCOUNTER — Telehealth: Payer: Self-pay | Admitting: Cardiovascular Disease

## 2022-12-04 NOTE — Telephone Encounter (Signed)
Spoke with Terry Stephens and gave her recommendations from Dr Windell Hummingbird note. She requested a copy be faxed to her at [phone].  Office note printed and faxed. Advised a call back if she does receive the fax by noon. She voiced understanding.

## 2022-12-04 NOTE — Telephone Encounter (Signed)
Caller Sherian Maroon) called to follow-up on the status of patient's clearance and how long to hold patient's Eliquis.  Caller noted patient will need to be notified today on how long to hold his Eliquis.

## 2022-12-10 ENCOUNTER — Encounter: Payer: Self-pay | Admitting: Internal Medicine

## 2022-12-10 ENCOUNTER — Inpatient Hospital Stay: Payer: Medicare Other | Attending: Oncology

## 2022-12-10 VITALS — BP 133/54 | HR 51 | Temp 96.0°F | Resp 16

## 2022-12-10 DIAGNOSIS — D509 Iron deficiency anemia, unspecified: Secondary | ICD-10-CM | POA: Diagnosis present

## 2022-12-10 DIAGNOSIS — D631 Anemia in chronic kidney disease: Secondary | ICD-10-CM | POA: Insufficient documentation

## 2022-12-10 DIAGNOSIS — N184 Chronic kidney disease, stage 4 (severe): Secondary | ICD-10-CM | POA: Insufficient documentation

## 2022-12-10 DIAGNOSIS — D500 Iron deficiency anemia secondary to blood loss (chronic): Secondary | ICD-10-CM

## 2022-12-10 MED ORDER — IRON SUCROSE 20 MG/ML IV SOLN
200.0000 mg | Freq: Once | INTRAVENOUS | Status: AC
  Administered 2022-12-10: 200 mg via INTRAVENOUS
  Filled 2022-12-10: qty 10

## 2022-12-10 NOTE — Progress Notes (Signed)
Declined 30 minute post observation. Aware of risks. Vitals stable at discharge.  

## 2022-12-10 NOTE — Patient Instructions (Signed)
Iron Sucrose Injection What is this medication? IRON SUCROSE (EYE ern SOO krose) treats low levels of iron (iron deficiency anemia) in people with kidney disease. Iron is a mineral that plays an important role in making red blood cells, which carry oxygen from your lungs to the rest of your body. This medicine may be used for other purposes; ask your health care provider or pharmacist if you have questions. COMMON BRAND NAME(S): Venofer What should I tell my care team before I take this medication? They need to know if you have any of these conditions: Anemia not caused by low iron levels Heart disease High levels of iron in the blood Kidney disease Liver disease An unusual or allergic reaction to iron, other medications, foods, dyes, or preservatives Pregnant or trying to get pregnant Breastfeeding How should I use this medication? This medication is for infusion into a vein. It is given in a hospital or clinic setting. Talk to your care team about the use of this medication in children. While this medication may be prescribed for children as young as 2 years for selected conditions, precautions do apply. Overdosage: If you think you have taken too much of this medicine contact a poison control center or emergency room at once. NOTE: This medicine is only for you. Do not share this medicine with others. What if I miss a dose? Keep appointments for follow-up doses. It is important not to miss your dose. Call your care team if you are unable to keep an appointment. What may interact with this medication? Do not take this medication with any of the following: Deferoxamine Dimercaprol Other iron products This medication may also interact with the following: Chloramphenicol Deferasirox This list may not describe all possible interactions. Give your health care provider a list of all the medicines, herbs, non-prescription drugs, or dietary supplements you use. Also tell them if you smoke,  drink alcohol, or use illegal drugs. Some items may interact with your medicine. What should I watch for while using this medication? Visit your care team regularly. Tell your care team if your symptoms do not start to get better or if they get worse. You may need blood work done while you are taking this medication. You may need to follow a special diet. Talk to your care team. Foods that contain iron include: whole grains/cereals, dried fruits, beans, or peas, leafy green vegetables, and organ meats (liver, kidney). What side effects may I notice from receiving this medication? Side effects that you should report to your care team as soon as possible: Allergic reactions--skin rash, itching, hives, swelling of the face, lips, tongue, or throat Low blood pressure--dizziness, feeling faint or lightheaded, blurry vision Shortness of breath Side effects that usually do not require medical attention (report to your care team if they continue or are bothersome): Flushing Headache Joint pain Muscle pain Nausea Pain, redness, or irritation at injection site This list may not describe all possible side effects. Call your doctor for medical advice about side effects. You may report side effects to FDA at 1-800-FDA-1088. Where should I keep my medication? This medication is given in a hospital or clinic. It will not be stored at home. NOTE: This sheet is a summary. It may not cover all possible information. If you have questions about this medicine, talk to your doctor, pharmacist, or health care provider.  2024 Elsevier/Gold Standard (2022-05-31 00:00:00)

## 2022-12-11 ENCOUNTER — Encounter: Payer: Self-pay | Admitting: Internal Medicine

## 2022-12-11 ENCOUNTER — Ambulatory Visit: Payer: Medicare Other | Admitting: Certified Registered"

## 2022-12-11 ENCOUNTER — Other Ambulatory Visit: Payer: Self-pay

## 2022-12-11 ENCOUNTER — Ambulatory Visit
Admission: RE | Admit: 2022-12-11 | Discharge: 2022-12-11 | Disposition: A | Discharge status: Home / Self Care | Payer: Medicare Other | Source: Ambulatory Visit | Attending: Internal Medicine | Admitting: Internal Medicine

## 2022-12-11 ENCOUNTER — Encounter
Admission: RE | Disposition: A | Discharge status: Home / Self Care | Payer: Self-pay | Source: Ambulatory Visit | Attending: Internal Medicine

## 2022-12-11 DIAGNOSIS — K3189 Other diseases of stomach and duodenum: Secondary | ICD-10-CM | POA: Insufficient documentation

## 2022-12-11 DIAGNOSIS — K766 Portal hypertension: Secondary | ICD-10-CM | POA: Diagnosis present

## 2022-12-11 DIAGNOSIS — D124 Benign neoplasm of descending colon: Secondary | ICD-10-CM | POA: Insufficient documentation

## 2022-12-11 DIAGNOSIS — R578 Other shock: Secondary | ICD-10-CM | POA: Diagnosis not present

## 2022-12-11 DIAGNOSIS — K91840 Postprocedural hemorrhage and hematoma of a digestive system organ or structure following a digestive system procedure: Principal | ICD-10-CM | POA: Diagnosis not present

## 2022-12-11 DIAGNOSIS — R571 Hypovolemic shock: Secondary | ICD-10-CM | POA: Diagnosis not present

## 2022-12-11 DIAGNOSIS — Z888 Allergy status to other drugs, medicaments and biological substances status: Secondary | ICD-10-CM

## 2022-12-11 DIAGNOSIS — Z860101 Personal history of adenomatous and serrated colon polyps: Secondary | ICD-10-CM

## 2022-12-11 DIAGNOSIS — K5730 Diverticulosis of large intestine without perforation or abscess without bleeding: Secondary | ICD-10-CM | POA: Insufficient documentation

## 2022-12-11 DIAGNOSIS — Z8249 Family history of ischemic heart disease and other diseases of the circulatory system: Secondary | ICD-10-CM

## 2022-12-11 DIAGNOSIS — D123 Benign neoplasm of transverse colon: Secondary | ICD-10-CM | POA: Diagnosis present

## 2022-12-11 DIAGNOSIS — I5022 Chronic systolic (congestive) heart failure: Secondary | ICD-10-CM | POA: Diagnosis present

## 2022-12-11 DIAGNOSIS — D122 Benign neoplasm of ascending colon: Secondary | ICD-10-CM | POA: Insufficient documentation

## 2022-12-11 DIAGNOSIS — Z87891 Personal history of nicotine dependence: Secondary | ICD-10-CM

## 2022-12-11 DIAGNOSIS — N185 Chronic kidney disease, stage 5: Secondary | ICD-10-CM | POA: Diagnosis present

## 2022-12-11 DIAGNOSIS — Z79899 Other long term (current) drug therapy: Secondary | ICD-10-CM

## 2022-12-11 DIAGNOSIS — D62 Acute posthemorrhagic anemia: Secondary | ICD-10-CM | POA: Diagnosis not present

## 2022-12-11 DIAGNOSIS — G8191 Hemiplegia, unspecified affecting right dominant side: Secondary | ICD-10-CM | POA: Diagnosis not present

## 2022-12-11 DIAGNOSIS — N189 Chronic kidney disease, unspecified: Secondary | ICD-10-CM | POA: Insufficient documentation

## 2022-12-11 DIAGNOSIS — J189 Pneumonia, unspecified organism: Secondary | ICD-10-CM | POA: Diagnosis not present

## 2022-12-11 DIAGNOSIS — D500 Iron deficiency anemia secondary to blood loss (chronic): Principal | ICD-10-CM | POA: Diagnosis present

## 2022-12-11 DIAGNOSIS — I471 Supraventricular tachycardia, unspecified: Secondary | ICD-10-CM | POA: Diagnosis not present

## 2022-12-11 DIAGNOSIS — Y838 Other surgical procedures as the cause of abnormal reaction of the patient, or of later complication, without mention of misadventure at the time of the procedure: Secondary | ICD-10-CM | POA: Diagnosis not present

## 2022-12-11 DIAGNOSIS — K921 Melena: Secondary | ICD-10-CM | POA: Diagnosis not present

## 2022-12-11 DIAGNOSIS — R471 Dysarthria and anarthria: Secondary | ICD-10-CM | POA: Diagnosis not present

## 2022-12-11 DIAGNOSIS — D120 Benign neoplasm of cecum: Secondary | ICD-10-CM | POA: Diagnosis present

## 2022-12-11 DIAGNOSIS — I2510 Atherosclerotic heart disease of native coronary artery without angina pectoris: Secondary | ICD-10-CM | POA: Diagnosis present

## 2022-12-11 DIAGNOSIS — K642 Third degree hemorrhoids: Secondary | ICD-10-CM | POA: Insufficient documentation

## 2022-12-11 DIAGNOSIS — Z953 Presence of xenogenic heart valve: Secondary | ICD-10-CM

## 2022-12-11 DIAGNOSIS — I132 Hypertensive heart and chronic kidney disease with heart failure and with stage 5 chronic kidney disease, or end stage renal disease: Secondary | ICD-10-CM | POA: Diagnosis present

## 2022-12-11 DIAGNOSIS — K2970 Gastritis, unspecified, without bleeding: Secondary | ICD-10-CM | POA: Insufficient documentation

## 2022-12-11 DIAGNOSIS — G9341 Metabolic encephalopathy: Secondary | ICD-10-CM | POA: Diagnosis not present

## 2022-12-11 DIAGNOSIS — J309 Allergic rhinitis, unspecified: Secondary | ICD-10-CM | POA: Diagnosis present

## 2022-12-11 DIAGNOSIS — Z7901 Long term (current) use of anticoagulants: Secondary | ICD-10-CM | POA: Insufficient documentation

## 2022-12-11 DIAGNOSIS — I129 Hypertensive chronic kidney disease with stage 1 through stage 4 chronic kidney disease, or unspecified chronic kidney disease: Secondary | ICD-10-CM | POA: Insufficient documentation

## 2022-12-11 DIAGNOSIS — E782 Mixed hyperlipidemia: Secondary | ICD-10-CM | POA: Diagnosis present

## 2022-12-11 DIAGNOSIS — E872 Acidosis, unspecified: Secondary | ICD-10-CM | POA: Diagnosis not present

## 2022-12-11 DIAGNOSIS — I4510 Unspecified right bundle-branch block: Secondary | ICD-10-CM | POA: Diagnosis present

## 2022-12-11 DIAGNOSIS — D125 Benign neoplasm of sigmoid colon: Secondary | ICD-10-CM | POA: Insufficient documentation

## 2022-12-11 DIAGNOSIS — D696 Thrombocytopenia, unspecified: Secondary | ICD-10-CM | POA: Diagnosis not present

## 2022-12-11 DIAGNOSIS — E669 Obesity, unspecified: Secondary | ICD-10-CM | POA: Diagnosis present

## 2022-12-11 DIAGNOSIS — Z6824 Body mass index (BMI) 24.0-24.9, adult: Secondary | ICD-10-CM

## 2022-12-11 DIAGNOSIS — D631 Anemia in chronic kidney disease: Secondary | ICD-10-CM | POA: Diagnosis present

## 2022-12-11 DIAGNOSIS — Z951 Presence of aortocoronary bypass graft: Secondary | ICD-10-CM

## 2022-12-11 DIAGNOSIS — I480 Paroxysmal atrial fibrillation: Secondary | ICD-10-CM | POA: Diagnosis present

## 2022-12-11 DIAGNOSIS — D509 Iron deficiency anemia, unspecified: Secondary | ICD-10-CM | POA: Insufficient documentation

## 2022-12-11 DIAGNOSIS — N5089 Other specified disorders of the male genital organs: Secondary | ICD-10-CM | POA: Diagnosis not present

## 2022-12-11 HISTORY — PX: ESOPHAGOGASTRODUODENOSCOPY (EGD) WITH PROPOFOL: SHX5813

## 2022-12-11 HISTORY — PX: COLONOSCOPY WITH PROPOFOL: SHX5780

## 2022-12-11 HISTORY — PX: BIOPSY: SHX5522

## 2022-12-11 HISTORY — PX: POLYPECTOMY: SHX5525

## 2022-12-11 HISTORY — DX: Allergic rhinitis, unspecified: J30.9

## 2022-12-11 HISTORY — PX: HEMOSTASIS CLIP PLACEMENT: SHX6857

## 2022-12-11 LAB — CBC WITH DIFFERENTIAL/PLATELET
Abs Immature Granulocytes: 0.02 10*3/uL (ref 0.00–0.07)
Basophils Absolute: 0.1 10*3/uL (ref 0.0–0.1)
Basophils Relative: 1 %
Eosinophils Absolute: 0.2 10*3/uL (ref 0.0–0.5)
Eosinophils Relative: 4 %
HCT: 34.7 % — ABNORMAL LOW (ref 39.0–52.0)
Hemoglobin: 10.6 g/dL — ABNORMAL LOW (ref 13.0–17.0)
Immature Granulocytes: 0 %
Lymphocytes Relative: 25 %
Lymphs Abs: 1.2 10*3/uL (ref 0.7–4.0)
MCH: 30.3 pg (ref 26.0–34.0)
MCHC: 30.5 g/dL (ref 30.0–36.0)
MCV: 99.1 fL (ref 80.0–100.0)
Monocytes Absolute: 0.5 10*3/uL (ref 0.1–1.0)
Monocytes Relative: 10 %
Neutro Abs: 2.8 10*3/uL (ref 1.7–7.7)
Neutrophils Relative %: 60 %
Platelets: 154 10*3/uL (ref 150–400)
RBC: 3.5 MIL/uL — ABNORMAL LOW (ref 4.22–5.81)
RDW: 14.7 % (ref 11.5–15.5)
WBC: 4.7 10*3/uL (ref 4.0–10.5)
nRBC: 0 % (ref 0.0–0.2)

## 2022-12-11 LAB — BASIC METABOLIC PANEL
Anion gap: 15 (ref 5–15)
BUN: 77 mg/dL — ABNORMAL HIGH (ref 8–23)
CO2: 16 mmol/L — ABNORMAL LOW (ref 22–32)
Calcium: 8.1 mg/dL — ABNORMAL LOW (ref 8.9–10.3)
Chloride: 108 mmol/L (ref 98–111)
Creatinine, Ser: 4.3 mg/dL — ABNORMAL HIGH (ref 0.61–1.24)
GFR, Estimated: 13 mL/min — ABNORMAL LOW (ref >60)
Glucose, Bld: 129 mg/dL — ABNORMAL HIGH (ref 70–99)
Potassium: 3.5 mmol/L (ref 3.5–5.1)
Sodium: 139 mmol/L (ref 135–145)

## 2022-12-11 SURGERY — COLONOSCOPY WITH PROPOFOL
Anesthesia: General

## 2022-12-11 MED ORDER — SODIUM CHLORIDE 0.9 % IV SOLN: INTRAVENOUS | Status: DC

## 2022-12-11 MED ORDER — PROPOFOL 500 MG/50ML IV EMUL
INTRAVENOUS | Status: DC | PRN
  Administered 2022-12-11: 115 ug/kg/min via INTRAVENOUS

## 2022-12-11 MED ORDER — PROPOFOL 10 MG/ML IV BOLUS
INTRAVENOUS | Status: DC | PRN
  Administered 2022-12-11: 10 mg via INTRAVENOUS
  Administered 2022-12-11: 40 mg via INTRAVENOUS

## 2022-12-11 MED ORDER — EPHEDRINE 5 MG/ML INJ
INTRAVENOUS | Status: AC
  Filled 2022-12-11: qty 5

## 2022-12-11 MED ORDER — EPHEDRINE SULFATE-NACL 50-0.9 MG/10ML-% IV SOSY
PREFILLED_SYRINGE | INTRAVENOUS | Status: DC | PRN
  Administered 2022-12-11 (×2): 10 mg via INTRAVENOUS
  Administered 2022-12-11: 5 mg via INTRAVENOUS
  Administered 2022-12-11: 10 mg via INTRAVENOUS
  Administered 2022-12-11: 5 mg via INTRAVENOUS
  Administered 2022-12-11: 10 mg via INTRAVENOUS

## 2022-12-11 MED ORDER — PROPOFOL 1000 MG/100ML IV EMUL
INTRAVENOUS | Status: AC
  Filled 2022-12-11: qty 100

## 2022-12-11 MED ORDER — PHENYLEPHRINE 80 MCG/ML (10ML) SYRINGE FOR IV PUSH (FOR BLOOD PRESSURE SUPPORT)
PREFILLED_SYRINGE | INTRAVENOUS | Status: AC
  Filled 2022-12-11: qty 10

## 2022-12-11 MED ORDER — PHENYLEPHRINE 80 MCG/ML (10ML) SYRINGE FOR IV PUSH (FOR BLOOD PRESSURE SUPPORT)
PREFILLED_SYRINGE | INTRAVENOUS | Status: DC | PRN
  Administered 2022-12-11 (×2): 80 ug via INTRAVENOUS

## 2022-12-11 NOTE — Op Note (Signed)
[facility] Patient Name: Terry Stephens Procedure Date: 12/11/2022 1:55 PM MRN: [id number] Account #: [id number] Date of Birth: 1940-03-20 Admit Type: Outpatient Age: 82 Room: [facility] ENDO ROOM 4 Gender: Male Note Status: Finalized Instrument Name: Upper Endoscope [id number] Procedure:             Upper GI endoscopy Indications:           Unexplained iron deficiency anemia Providers:             Boykin Nearing. Norma Fredrickson MD, MD Referring MD:          No Local Md, MD (Referring MD) Medicines:             Propofol per Anesthesia Complications:         No immediate complications. Estimated blood loss:                         Minimal. Procedure:             Pre-Anesthesia Assessment:                        - The risks and benefits of the procedure and the                         sedation options and risks were discussed with the                         patient. All questions were answered and informed                         consent was obtained.                        - Patient identification and proposed procedure were                         verified prior to the procedure by the nurse. The                         procedure was verified in the procedure room.                        - ASA Grade Assessment: III - A patient with severe                         systemic disease.                        - After reviewing the risks and benefits, the patient                         was deemed in satisfactory condition to undergo the                         procedure.                        After obtaining informed consent, the endoscope was                         passed  under direct vision. Throughout the procedure,                         the patient's blood pressure, pulse, and oxygen                         saturations were monitored continuously. The                         Endosonoscope was introduced through the mouth, and                         advanced to  the third part of duodenum. The upper GI                         endoscopy was accomplished without difficulty. The                         patient tolerated the procedure well. Findings:      The esophagus was normal.      Localized mild inflammation characterized by congestion (edema) and       erosions was found in the gastric antrum. Biopsies were taken with a       cold forceps for Helicobacter pylori testing.      Mild portal hypertensive gastropathy was found in the entire examined       stomach.      The exam of the stomach was otherwise normal.      Localized moderately congested mucosa without active bleeding and with       no stigmata of bleeding was found in the duodenal bulb.      Multiple localized erosions without bleeding were found in the duodenal       bulb.      The second portion of the duodenum and third portion of the duodenum       were normal. Estimated blood loss: none.      The exam was otherwise without abnormality. Impression:            - Normal esophagus.                        - Gastritis. Biopsied.                        - Portal hypertensive gastropathy.                        - Congested duodenal mucosa.                        - Erosive duodenopathy without bleeding.                        - Normal second portion of the duodenum and third                         portion of the duodenum.                        - The examination was otherwise normal. Recommendation:        - Await pathology results.                        -  Proceed with colonoscopy Procedure Code(s):     --- Professional ---                        [phone], Esophagogastroduodenoscopy, flexible,                         transoral; with biopsy, single or multiple Diagnosis Code(s):     --- Professional ---                        D50.9, Iron deficiency anemia, unspecified                        K31.89, Other diseases of stomach and duodenum                        K76.6, Portal hypertension                         K29.70, Gastritis, unspecified, without bleeding CPT copyright 2022 American Medical Association. All rights reserved. The codes documented in this report are preliminary and upon coder review may  be revised to meet current compliance requirements. Stanton Kidney MD, MD 12/11/2022 2:13:44 PM This report has been signed electronically. Number of Addenda: 0 Note Initiated On: 12/11/2022 1:55 PM Estimated Blood Loss:  Estimated blood loss was minimal.      [facility]

## 2022-12-11 NOTE — Anesthesia Postprocedure Evaluation (Signed)
Anesthesia Post Note  Patient: Terry Sprott Karn Sr.  Procedure(s) Performed: COLONOSCOPY WITH PROPOFOL ESOPHAGOGASTRODUODENOSCOPY (EGD) WITH PROPOFOL BIOPSY POLYPECTOMY HEMOSTASIS CLIP PLACEMENT  Patient location during evaluation: PACU Anesthesia Type: General Level of consciousness: awake and alert Pain management: pain level controlled Vital Signs Assessment: post-procedure vital signs reviewed and stable Respiratory status: spontaneous breathing, nonlabored ventilation, respiratory function stable and patient connected to nasal cannula oxygen Cardiovascular status: blood pressure returned to baseline and stable Postop Assessment: no apparent nausea or vomiting Anesthetic complications: no  No notable events documented.   Last Vitals:  Vitals:   12/11/22 1456 12/11/22 1500  BP: (!) 82/42 (!) 89/43  Pulse: (!) 55 (!) 52  Resp: 16   Temp: (!) 35.8 C   SpO2: 99% 99%    Last Pain:  Vitals:   12/11/22 1456  TempSrc: Temporal  PainSc: Asleep                 Stephanie Coup

## 2022-12-11 NOTE — Anesthesia Preprocedure Evaluation (Signed)
Anesthesia Evaluation  Patient identified by MRN, date of birth, ID band Patient awake    Reviewed: Allergy & Precautions, NPO status , Patient's Chart, lab work & pertinent test results  History of Anesthesia Complications Negative for: history of anesthetic complications  Airway Mallampati: II  TM Distance: >3 FB Neck ROM: Full    Dental no notable dental hx. (+) Teeth Intact   Pulmonary neg pulmonary ROS, neg sleep apnea, neg COPD, Patient abstained from smoking.Not current smoker, former smoker   Pulmonary exam normal breath sounds clear to auscultation       Cardiovascular Exercise Tolerance: Good METShypertension, + CAD  (-) Past MI (-) dysrhythmias + Valvular Problems/Murmurs  Rhythm:Regular Rate:Normal - Systolic murmurs S/p aortic root repair after rupture, and s/p aortic valve replacement  TTE: INTERPRETATION(S)  Normal left ventricular size with reduced systolic function. LVEF 40%.  Enlarged right ventricular size with reduced function.  Normal function bioprosthetic aortic valve  Mild mitral  regurgitation.     Neuro/Psych negative neurological ROS  negative psych ROS   GI/Hepatic ,neg GERD  ,,(+)     (-) substance abuse    Endo/Other  neg diabetes    Renal/GU CRFRenal disease     Musculoskeletal   Abdominal   Peds  Hematology  (+) Blood dyscrasia, anemia   Anesthesia Other Findings Past Medical History: No date: A-fib (HCC) No date: Allergic rhinitis No date: CAD (coronary artery disease) No date: Chronic kidney disease No date: Hypertension  Reproductive/Obstetrics                             Anesthesia Physical Anesthesia Plan  ASA: 3  Anesthesia Plan: General   Post-op Pain Management: Minimal or no pain anticipated   Induction: Intravenous  PONV Risk Score and Plan: 2 and Propofol infusion, TIVA and Ondansetron  Airway Management Planned: Nasal  Cannula  Additional Equipment: None  Intra-op Plan:   Post-operative Plan:   Informed Consent: I have reviewed the patients History and Physical, chart, labs and discussed the procedure including the risks, benefits and alternatives for the proposed anesthesia with the patient or authorized representative who has indicated his/her understanding and acceptance.     Dental advisory given  Plan Discussed with: CRNA and Surgeon  Anesthesia Plan Comments: (Discussed risks of anesthesia with patient, including possibility of difficulty with spontaneous ventilation under anesthesia necessitating airway intervention, PONV, and rare risks such as cardiac or respiratory or neurological events, and allergic reactions. Discussed the role of CRNA in patient's perioperative care. Patient understands.)       Anesthesia Quick Evaluation

## 2022-12-11 NOTE — Interval H&P Note (Signed)
History and Physical Interval Note:  12/11/2022 12:38 PM  Terry Ano Shifrin Sr.  has presented today for surgery, with the diagnosis of D50.9 (ICD-10-CM) - Iron deficiency anemia, unspecified iron deficiency anemia type.  The various methods of treatment have been discussed with the patient and family. After consideration of risks, benefits and other options for treatment, the patient has consented to  Procedure(s): COLONOSCOPY WITH PROPOFOL (N/A) ESOPHAGOGASTRODUODENOSCOPY (EGD) WITH PROPOFOL (N/A) as a surgical intervention.  The patient's history has been reviewed, patient examined, no change in status, stable for surgery.  I have reviewed the patient's chart and labs.  Questions were answered to the patient's satisfaction.     [place], [place]

## 2022-12-11 NOTE — H&P (Signed)
Outpatient short stay form Pre-procedure 12/11/2022 12:35 PM Sheri Prows K. Norma Fredrickson, M.D.  Primary Physician: Dewaine Oats, M.D.  Reason for visit:    History of present illness:  Terry Stephens is a 82 y.o. male who presents to the [facility] for a consultation at the request of Dr. Cathie Hoops for evaluation of anemia. He is referred by hematology who he saw last week. 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 Most recent hemoglobin is around 8.5. He has been given IV Venofer x 4 in July and August 2024 and his fatigue has improved. Concern for GI blood loss referred to GI for evaluation. PMHx of Atrial Fib, CKD, CAD, HTN, s/p CABG. Terry Stephens reports overall having fairly regular bowel movements. He generally goes a few times a day and occasionally has some urgency and loose stool but denies any diarrhea. He does take Metamucil twice a day. He denies any blood in stool or dark stools. He denies any abdominal pain. No alternating constipation.  He denies any nausea vomiting or epigastric pain. He occasionally has reflux symptoms and uses Nexium on an as-needed basis. He does not have any dysphagia. He reports a good appetite but has recently been trying to lose some weight with diet modifications to improve his kidney disease.  He endorses that when his iron and hemoglobin dropped down he notices significant fatigue, shortness of breath with walking with the mailbox,etc. He reports the symptoms resolve as soon as he has an iron infusion. He had an iron infusion last week and is scheduled for weekly infusions the next 3 weeks.    No current facility-administered medications for this encounter.  Medications Prior to Admission  Medication Sig Dispense Refill Last Dose   Acetaminophen 500 MG coapsule 500 mg  prn as needed      amoxicillin (AMOXIL) 500 MG capsule       atorvastatin (LIPITOR) 20 MG tablet Take 1 tablet (20 mg total) by mouth daily. 90 tablet 3    Cholecalciferol (VITAMIN  D3) 2000 units capsule Take by mouth.      diclofenac Sodium (VOLTAREN) 1 % GEL Apply topically 4 (four) times daily as needed.      ELIQUIS 2.5 MG TABS tablet Take 1 tablet (2.5 mg total) by mouth 2 (two) times daily. 180 tablet 3    esomeprazole (NEXIUM) 40 MG capsule Take 40 mg by mouth as needed.      fluticasone (FLONASE) 50 MCG/ACT nasal spray Place into the nose.      furosemide (LASIX) 40 MG tablet Take 1 tablet (40 mg total) by mouth 2 (two) times daily. 180 tablet 3    losartan (COZAAR) 50 MG tablet Take 1 tablet (50 mg total) by mouth daily. 90 tablet 3    metoprolol succinate (TOPROL-XL) 100 MG 24 hr tablet Take 1 tablet (100 mg total) by mouth 2 (two) times daily. Take with or immediately following a meal. (Patient taking differently: Take 150 mg by mouth 2 (two) times daily. Take with or immediately following a meal.) 180 tablet 3    psyllium (METAMUCIL) 0.52 g capsule       Saw Palmetto 450 MG CAPS Take 450 mg by mouth daily.      sodium bicarbonate 650 MG tablet Take 650 mg by mouth 2 (two) times daily.      tacrolimus (PROTOPIC) 0.1 % ointment Apply topically.      tamsulosin (FLOMAX) 0.4 MG CAPS capsule Take 0.4 mg by mouth  daily after supper.        Allergies  Allergen Reactions   Phenothiazines Other (See Comments)    Extra-pyramidal reaction.   Extra-pyramidal reaction   Prochlorperazine Edisylate      Past Medical History:  Diagnosis Date   A-fib (HCC)    Allergic rhinitis    CAD (coronary artery disease)    Chronic kidney disease    Hypertension     Review of systems:  Otherwise negative.    Physical Exam  Gen: Alert, oriented. Appears stated age.  HEENT: [place]/AT. PERRLA. Lungs: CTA, no wheezes. CV: RR nl S1, S2. Abd: soft, benign, no masses. BS+ Ext: No edema. Pulses 2+    Planned procedures: Proceed with EGD and colonoscopy. Off Eliquis.The patient understands the nature of the planned procedure, indications, risks, alternatives and potential  complications including but not limited to bleeding, infection, perforation, damage to internal organs and possible oversedation/side effects from anesthesia. The patient agrees and gives consent to proceed.  Please refer to procedure notes for findings, recommendations and patient disposition/instructions.     Jeromie Gainor K. Norma Fredrickson, M.D. Gastroenterology 12/11/2022  12:35 PM

## 2022-12-11 NOTE — Anesthesia Procedure Notes (Signed)
Procedure Name: MAC Date/Time: 12/11/2022 2:03 PM  Performed by: Nelle Don, CRNAPre-anesthesia Checklist: Patient identified, Emergency Drugs available, Suction available and Patient being monitored Oxygen Delivery Method: Simple face mask

## 2022-12-11 NOTE — ED Triage Notes (Signed)
Pt reports having colonoscopy done earlier today, pt developed rectal bleeding tonight at home. Pt denies pain, pt was taking eliquis prior to colonoscopy today, has been off of it for 3 days. Pt reports no complications during procedure today.

## 2022-12-11 NOTE — Op Note (Signed)
[facility] Patient Name: Terry Stephens Procedure Date: 12/11/2022 1:55 PM MRN: [id number] Account #: [id number] Date of Birth: [birth year] Admit Type: Outpatient Age: 82 Room: [facility] ENDO ROOM 4 Gender: Male Note Status: Finalized Instrument Name: Colonoscope [id number] Procedure:             Colonoscopy Indications:           Unexplained iron deficiency anemia Providers:             Boykin Nearing. Norma Fredrickson MD, MD Referring MD:          No Local Md, MD (Referring MD) Medicines:             Propofol per Anesthesia Complications:         No immediate complications. Estimated blood loss:                         Minimal. Procedure:             Pre-Anesthesia Assessment:                        - The risks and benefits of the procedure and the                         sedation options and risks were discussed with the                         patient. All questions were answered and informed                         consent was obtained.                        - Patient identification and proposed procedure were                         verified prior to the procedure by the nurse. The                         procedure was verified in the procedure room.                        - ASA Grade Assessment: III - A patient with severe                         systemic disease.                        - After reviewing the risks and benefits, the patient                         was deemed in satisfactory condition to undergo the                         procedure.                        After obtaining informed consent, the colonoscope was                         passed under direct vision.  Throughout the procedure,                         the patient's blood pressure, pulse, and oxygen                         saturations were monitored continuously. The                         Colonoscope was introduced through the anus and                         advanced to the the  cecum, identified by appendiceal                         orifice and ileocecal valve. The patient tolerated the                         procedure well. The colonoscopy was somewhat difficult                         due to restricted mobility of the colon. Successful                         completion of the procedure was aided by changing the                         patient to a prone position. The patient tolerated the                         procedure well. The quality of the bowel preparation                         was adequate. The ileocecal valve, appendiceal                         orifice, and rectum were photographed. Findings:      The perianal and digital rectal examinations were normal. Pertinent       negatives include normal sphincter tone and no palpable rectal lesions.      Non-bleeding internal hemorrhoids were found during retroflexion. The       hemorrhoids were Grade III (internal hemorrhoids that prolapse but       require manual reduction).      Multiple large-mouthed, medium-mouthed and small-mouthed diverticula       were found in the entire colon. There was no evidence of diverticular       bleeding.      Two semi-pedunculated polyps were found in the cecum. The polyps were 6       to 9 mm in size. These polyps were removed with a hot snare. Resection       and retrieval were complete.      Two pedunculated polyps were found in the proximal ascending colon. The       polyps were 10 to 12 mm in size. These polyps were removed with a hot       snare. Resection and retrieval were complete. To prevent bleeding after       the polypectomy, two hemostatic clips were successfully placed (MR  conditional). Clip manufacturer: [employer]. There was no       bleeding during, or at the end, of the procedure.      Two sessile polyps were found in the proximal ascending colon. The       polyps were 6 to 8 mm in size. These polyps were removed with a hot        snare. Resection and retrieval were complete.      A 12 mm polyp was found in the mid ascending colon. The polyp was       semi-pedunculated. The polyp was removed with a hot snare. Resection and       retrieval were complete. To prevent bleeding after the polypectomy, one       hemostatic clip was successfully placed (MR conditional). Clip       manufacturer: [employer]. There was no bleeding during, or at the       end, of the procedure.      Three semi-pedunculated polyps were found in the transverse colon. The       polyps were 10 to 14 mm in size. These polyps were removed with a hot       snare. Resection and retrieval were complete.      Three semi-pedunculated polyps were found in the descending colon. The       polyps were 8 to 12 mm in size. These polyps were removed with a hot       snare. Resection and retrieval were complete.      Two semi-pedunculated polyps were found in the sigmoid colon. The polyps       were 9 to 12 mm in size. These polyps were removed with a hot snare.       Resection and retrieval were complete.      The exam was otherwise without abnormality. Impression:            - Non-bleeding internal hemorrhoids.                        - Moderate diverticulosis in the entire examined                         colon. There was no evidence of diverticular bleeding.                        - Two 6 to 9 mm polyps in the cecum, removed with a                         hot snare. Resected and retrieved.                        - Two 10 to 12 mm polyps in the proximal ascending                         colon, removed with a hot snare. Resected and                         retrieved. Clips (MR conditional) were placed. Clip                         manufacturer: [employer].                        -  Two 6 to 8 mm polyps in the proximal ascending                         colon, removed with a hot snare. Resected and                         retrieved.                         - One 12 mm polyp in the mid ascending colon, removed                         with a hot snare. Resected and retrieved. Clip (MR                         conditional) was placed. Clip manufacturer: [employer].                        - Three 10 to 14 mm polyps in the transverse colon,                         removed with a hot snare. Resected and retrieved.                        - Three 8 to 12 mm polyps in the descending colon,                         removed with a hot snare. Resected and retrieved.                        - Two 9 to 12 mm polyps in the sigmoid colon, removed                         with a hot snare. Resected and retrieved.                        - The examination was otherwise normal. Recommendation:        - Await pathology results from EGD, also performed                         today.                        - Patient has a contact number available for                         emergencies. The signs and symptoms of potential                         delayed complications were discussed with the patient.                         Return to normal activities tomorrow. Written  discharge instructions were provided to the patient.                        - Resume previous diet.                        - Continue present medications.                        - Resume Eliquis (apixaban) at prior dose tomorrow.                         Refer to managing physician for further adjustment of                         therapy.                        - Await pathology results.                        - Repeat colonoscopy is recommended for surveillance.                         The colonoscopy date will be determined after                         pathology results from today's exam become available                         for review.                        - Return to GI office after studies are complete.                        - To  visualize the small bowel, perform video capsule                         endoscopy at appointment to be scheduled.                        - Follow up with Jacob Moores, PA-C in the GI office.                         [phone]                        - Telephone GI office to schedule appointment in 3                         months.                        - The findings and recommendations were discussed with                         the patient. Procedure Code(s):     --- Professional ---                        [phone], Colonoscopy, flexible; with removal  of                         tumor(s), polyp(s), or other lesion(s) by snare                         technique Diagnosis Code(s):     --- Professional ---                        K57.30, Diverticulosis of large intestine without                         perforation or abscess without bleeding                        D50.9, Iron deficiency anemia, unspecified                        D12.2, Benign neoplasm of ascending colon                        D12.5, Benign neoplasm of sigmoid colon                        D12.4, Benign neoplasm of descending colon                        D12.3, Benign neoplasm of transverse colon (hepatic                         flexure or splenic flexure)                        D12.0, Benign neoplasm of cecum                        K64.2, Third degree hemorrhoids CPT copyright 2022 [employer]. All rights reserved. The codes documented in this report are preliminary and upon coder review may  be revised to meet current compliance requirements. Stanton Kidney MD, MD 12/11/2022 3:03:45 PM This report has been signed electronically. Number of Addenda: 0 Note Initiated On: 12/11/2022 1:55 PM Scope Withdrawal Time: 0 hours 29 minutes 9 seconds  Total Procedure Duration: 0 hours 37 minutes 46 seconds  Estimated Blood Loss:  Estimated blood loss was minimal. Estimated blood loss                         was  minimal.      [facility]

## 2022-12-11 NOTE — Transfer of Care (Signed)
Immediate Anesthesia Transfer of Care Note  Patient: Terry Varon Sleight Sr.  Procedure(s) Performed: COLONOSCOPY WITH PROPOFOL ESOPHAGOGASTRODUODENOSCOPY (EGD) WITH PROPOFOL BIOPSY POLYPECTOMY HEMOSTASIS CLIP PLACEMENT  Patient Location: PACU  Anesthesia Type:General  Level of Consciousness: drowsy  Airway & Oxygen Therapy: Patient Spontanous Breathing  Post-op Assessment: Report given to RN, Post -op Vital signs reviewed and stable, and Patient moving all extremities X 4  Post vital signs: Reviewed and stable  Last Vitals:  Vitals Value Taken Time  BP 89/43 12/11/22 1500  Temp 35.8 C 12/11/22 1456  Pulse 52 12/11/22 1500  Resp 16 12/11/22 1456  SpO2 99 % 12/11/22 1500    Last Pain:  Vitals:   12/11/22 1456  TempSrc: Temporal  PainSc: Asleep         Complications: No notable events documented.

## 2022-12-12 ENCOUNTER — Inpatient Hospital Stay
Admission: EM | Admit: 2022-12-12 | Discharge: 2022-12-20 | DRG: 919 | Disposition: A | Discharge status: Home Health | Payer: Medicare Other | Attending: Student | Admitting: Student

## 2022-12-12 ENCOUNTER — Other Ambulatory Visit: Payer: Self-pay

## 2022-12-12 ENCOUNTER — Encounter: Payer: Self-pay | Admitting: Internal Medicine

## 2022-12-12 DIAGNOSIS — D62 Acute posthemorrhagic anemia: Secondary | ICD-10-CM | POA: Diagnosis present

## 2022-12-12 DIAGNOSIS — G8191 Hemiplegia, unspecified affecting right dominant side: Secondary | ICD-10-CM | POA: Diagnosis not present

## 2022-12-12 DIAGNOSIS — K3189 Other diseases of stomach and duodenum: Secondary | ICD-10-CM | POA: Diagnosis present

## 2022-12-12 DIAGNOSIS — Z7901 Long term (current) use of anticoagulants: Secondary | ICD-10-CM

## 2022-12-12 DIAGNOSIS — I4891 Unspecified atrial fibrillation: Secondary | ICD-10-CM | POA: Diagnosis not present

## 2022-12-12 DIAGNOSIS — I5022 Chronic systolic (congestive) heart failure: Secondary | ICD-10-CM | POA: Diagnosis present

## 2022-12-12 DIAGNOSIS — N185 Chronic kidney disease, stage 5: Secondary | ICD-10-CM | POA: Diagnosis present

## 2022-12-12 DIAGNOSIS — D696 Thrombocytopenia, unspecified: Secondary | ICD-10-CM | POA: Diagnosis not present

## 2022-12-12 DIAGNOSIS — K921 Melena: Secondary | ICD-10-CM

## 2022-12-12 DIAGNOSIS — N179 Acute kidney failure, unspecified: Secondary | ICD-10-CM | POA: Diagnosis not present

## 2022-12-12 DIAGNOSIS — R58 Hemorrhage, not elsewhere classified: Secondary | ICD-10-CM | POA: Diagnosis not present

## 2022-12-12 DIAGNOSIS — Z953 Presence of xenogenic heart valve: Secondary | ICD-10-CM | POA: Diagnosis not present

## 2022-12-12 DIAGNOSIS — I2510 Atherosclerotic heart disease of native coronary artery without angina pectoris: Secondary | ICD-10-CM

## 2022-12-12 DIAGNOSIS — G9341 Metabolic encephalopathy: Secondary | ICD-10-CM | POA: Diagnosis not present

## 2022-12-12 DIAGNOSIS — R571 Hypovolemic shock: Secondary | ICD-10-CM | POA: Diagnosis not present

## 2022-12-12 DIAGNOSIS — D631 Anemia in chronic kidney disease: Secondary | ICD-10-CM | POA: Diagnosis present

## 2022-12-12 DIAGNOSIS — I493 Ventricular premature depolarization: Secondary | ICD-10-CM | POA: Diagnosis not present

## 2022-12-12 DIAGNOSIS — J189 Pneumonia, unspecified organism: Secondary | ICD-10-CM | POA: Diagnosis not present

## 2022-12-12 DIAGNOSIS — Y838 Other surgical procedures as the cause of abnormal reaction of the patient, or of later complication, without mention of misadventure at the time of the procedure: Secondary | ICD-10-CM | POA: Diagnosis not present

## 2022-12-12 DIAGNOSIS — E782 Mixed hyperlipidemia: Secondary | ICD-10-CM | POA: Diagnosis present

## 2022-12-12 DIAGNOSIS — N189 Chronic kidney disease, unspecified: Secondary | ICD-10-CM

## 2022-12-12 DIAGNOSIS — I471 Supraventricular tachycardia, unspecified: Secondary | ICD-10-CM | POA: Diagnosis present

## 2022-12-12 DIAGNOSIS — D509 Iron deficiency anemia, unspecified: Secondary | ICD-10-CM | POA: Diagnosis present

## 2022-12-12 DIAGNOSIS — E872 Acidosis, unspecified: Secondary | ICD-10-CM | POA: Diagnosis not present

## 2022-12-12 DIAGNOSIS — E669 Obesity, unspecified: Secondary | ICD-10-CM | POA: Diagnosis present

## 2022-12-12 DIAGNOSIS — K91840 Postprocedural hemorrhage and hematoma of a digestive system organ or structure following a digestive system procedure: Secondary | ICD-10-CM | POA: Diagnosis not present

## 2022-12-12 DIAGNOSIS — I480 Paroxysmal atrial fibrillation: Secondary | ICD-10-CM | POA: Diagnosis present

## 2022-12-12 DIAGNOSIS — J309 Allergic rhinitis, unspecified: Secondary | ICD-10-CM | POA: Diagnosis present

## 2022-12-12 DIAGNOSIS — K766 Portal hypertension: Secondary | ICD-10-CM | POA: Diagnosis present

## 2022-12-12 DIAGNOSIS — R578 Other shock: Secondary | ICD-10-CM | POA: Diagnosis not present

## 2022-12-12 DIAGNOSIS — I132 Hypertensive heart and chronic kidney disease with heart failure and with stage 5 chronic kidney disease, or end stage renal disease: Secondary | ICD-10-CM | POA: Diagnosis present

## 2022-12-12 DIAGNOSIS — K625 Hemorrhage of anus and rectum: Principal | ICD-10-CM

## 2022-12-12 DIAGNOSIS — I10 Essential (primary) hypertension: Secondary | ICD-10-CM | POA: Diagnosis present

## 2022-12-12 DIAGNOSIS — K922 Gastrointestinal hemorrhage, unspecified: Secondary | ICD-10-CM | POA: Diagnosis present

## 2022-12-12 DIAGNOSIS — R569 Unspecified convulsions: Secondary | ICD-10-CM | POA: Diagnosis not present

## 2022-12-12 DIAGNOSIS — D500 Iron deficiency anemia secondary to blood loss (chronic): Secondary | ICD-10-CM | POA: Diagnosis present

## 2022-12-12 LAB — HEMOGLOBIN
Hemoglobin: 8 g/dL — ABNORMAL LOW (ref 13.0–17.0)
Hemoglobin: 8.3 g/dL — ABNORMAL LOW (ref 13.0–17.0)

## 2022-12-12 LAB — BASIC METABOLIC PANEL
Anion gap: 11 (ref 5–15)
BUN: 77 mg/dL — ABNORMAL HIGH (ref 8–23)
CO2: 19 mmol/L — ABNORMAL LOW (ref 22–32)
Calcium: 7.7 mg/dL — ABNORMAL LOW (ref 8.9–10.3)
Chloride: 111 mmol/L (ref 98–111)
Creatinine, Ser: 4.26 mg/dL — ABNORMAL HIGH (ref 0.61–1.24)
GFR, Estimated: 13 mL/min — ABNORMAL LOW (ref >60)
Glucose, Bld: 103 mg/dL — ABNORMAL HIGH (ref 70–99)
Potassium: 3.8 mmol/L (ref 3.5–5.1)
Sodium: 141 mmol/L (ref 135–145)

## 2022-12-12 LAB — GLUCOSE, CAPILLARY: Glucose-Capillary: 87 mg/dL (ref 70–99)

## 2022-12-12 LAB — PROTIME-INR
INR: 1.3 — ABNORMAL HIGH (ref 0.8–1.2)
Prothrombin Time: 16.2 s — ABNORMAL HIGH (ref 11.4–15.2)

## 2022-12-12 LAB — PREPARE RBC (CROSSMATCH)

## 2022-12-12 LAB — MRSA NEXT GEN BY PCR, NASAL: MRSA by PCR Next Gen: NOT DETECTED

## 2022-12-12 MED ORDER — ONDANSETRON HCL 4 MG/2ML IJ SOLN
4.0000 mg | Freq: Four times a day (QID) | INTRAMUSCULAR | Status: DC | PRN
  Administered 2022-12-12: 4 mg via INTRAVENOUS
  Filled 2022-12-12: qty 2

## 2022-12-12 MED ORDER — METOPROLOL SUCCINATE ER 50 MG PO TB24
150.0000 mg | ORAL_TABLET | Freq: Two times a day (BID) | ORAL | Status: DC
  Filled 2022-12-12: qty 3

## 2022-12-12 MED ORDER — ATORVASTATIN CALCIUM 20 MG PO TABS
20.0000 mg | ORAL_TABLET | Freq: Every day | ORAL | Status: DC
  Administered 2022-12-14 – 2022-12-20 (×7): 20 mg via ORAL
  Filled 2022-12-12 (×7): qty 1

## 2022-12-12 MED ORDER — SODIUM CHLORIDE 0.9% IV SOLUTION
Freq: Once | INTRAVENOUS | Status: AC
Start: 2022-12-12 — End: 2022-12-13

## 2022-12-12 MED ORDER — ORAL CARE MOUTH RINSE: 15.0000 mL | OROMUCOSAL | Status: DC | PRN

## 2022-12-12 MED ORDER — ACETAMINOPHEN 650 MG RE SUPP: 650.0000 mg | Freq: Four times a day (QID) | RECTAL | Status: DC | PRN

## 2022-12-12 MED ORDER — NOREPINEPHRINE 4 MG/250ML-% IV SOLN
2.0000 ug/min | INTRAVENOUS | Status: DC
Start: 2022-12-13 — End: 2022-12-13
  Administered 2022-12-12: 3 ug/min via INTRAVENOUS
  Administered 2022-12-13: 8 ug/min via INTRAVENOUS
  Administered 2022-12-13: 10 ug/min via INTRAVENOUS
  Filled 2022-12-12 (×2): qty 250

## 2022-12-12 MED ORDER — TAMSULOSIN HCL 0.4 MG PO CAPS
0.4000 mg | ORAL_CAPSULE | Freq: Every day | ORAL | Status: DC
  Administered 2022-12-12 – 2022-12-19 (×7): 0.4 mg via ORAL
  Filled 2022-12-12 (×7): qty 1

## 2022-12-12 MED ORDER — ONDANSETRON HCL 4 MG PO TABS: 4.0000 mg | ORAL_TABLET | Freq: Four times a day (QID) | ORAL | Status: DC | PRN

## 2022-12-12 MED ORDER — HYDROCODONE-ACETAMINOPHEN 5-325 MG PO TABS: 1.0000 | ORAL_TABLET | ORAL | Status: DC | PRN

## 2022-12-12 MED ORDER — SODIUM CHLORIDE 0.9% FLUSH
10.0000 mL | Freq: Two times a day (BID) | INTRAVENOUS | Status: DC
  Administered 2022-12-12 – 2022-12-20 (×17): 10 mL via INTRAVENOUS

## 2022-12-12 MED ORDER — LOSARTAN POTASSIUM 50 MG PO TABS: 50.0000 mg | ORAL_TABLET | Freq: Every day | ORAL | Status: DC

## 2022-12-12 MED ORDER — PANTOPRAZOLE SODIUM 40 MG PO TBEC: 40.0000 mg | DELAYED_RELEASE_TABLET | Freq: Every day | ORAL | Status: DC

## 2022-12-12 MED ORDER — SODIUM CHLORIDE 0.9 % IV SOLN
250.0000 mL | INTRAVENOUS | Status: AC
  Administered 2022-12-12: 250 mL via INTRAVENOUS

## 2022-12-12 MED ORDER — NOREPINEPHRINE 4 MG/250ML-% IV SOLN
INTRAVENOUS | Status: AC
  Filled 2022-12-12: qty 250

## 2022-12-12 MED ORDER — SODIUM BICARBONATE 650 MG PO TABS
650.0000 mg | ORAL_TABLET | Freq: Two times a day (BID) | ORAL | Status: DC
  Administered 2022-12-12 – 2022-12-14 (×2): 650 mg via ORAL
  Filled 2022-12-12 (×6): qty 1

## 2022-12-12 MED ORDER — SODIUM CHLORIDE 0.9 % IV BOLUS
1000.0000 mL | Freq: Once | INTRAVENOUS | Status: AC
  Administered 2022-12-12: 1000 mL via INTRAVENOUS

## 2022-12-12 MED ORDER — MORPHINE SULFATE (PF) 2 MG/ML IV SOLN: 2.0000 mg | INTRAVENOUS | Status: DC | PRN

## 2022-12-12 MED ORDER — CHLORHEXIDINE GLUCONATE CLOTH 2 % EX PADS
6.0000 | MEDICATED_PAD | Freq: Every day | CUTANEOUS | Status: DC
  Administered 2022-12-12 – 2022-12-20 (×7): 6 via TOPICAL

## 2022-12-12 MED ORDER — SODIUM CHLORIDE 0.9 % IV SOLN
500.0000 mL | Freq: Once | INTRAVENOUS | Status: AC
Start: 2022-12-12 — End: 2022-12-13
  Administered 2022-12-12: 500 mL via INTRAVENOUS

## 2022-12-12 MED ORDER — SODIUM CHLORIDE 0.9% IV SOLUTION
Freq: Once | INTRAVENOUS | Status: AC
  Filled 2022-12-12: qty 250

## 2022-12-12 MED ORDER — ACETAMINOPHEN 325 MG PO TABS
650.0000 mg | ORAL_TABLET | Freq: Four times a day (QID) | ORAL | Status: DC | PRN
  Administered 2022-12-16 – 2022-12-18 (×2): 650 mg via ORAL
  Filled 2022-12-12 (×2): qty 2

## 2022-12-12 NOTE — ED Notes (Signed)
Blood administration done from emergency blood - see prior RN charting for documentation of administration.

## 2022-12-12 NOTE — ED Notes (Signed)
Pt brief changed prior to transport to the floor. Pt with large amount of blood and clots noted in brief. Pt denied pain or dizziness. Pt cleaned. New brief in place.

## 2022-12-12 NOTE — Assessment & Plan Note (Addendum)
Anemia in chronic kidney disease Hemoglobin at baseline Has required iron transfusions in the past Continue to monitor

## 2022-12-12 NOTE — ED Notes (Signed)
Dr. Toledo at bedside.  

## 2022-12-12 NOTE — Consult Note (Signed)
GI Inpatient Consult Note  Reason for Consult: Hematochezia/GIB   Attending Requesting Consult: Dr. [name]  History of Present Illness: Terry BALIUS Sr. is a 82 y.o. male seen for evaluation of hematochezia at the request of admitting hospitalist - Dr. [name]. Patient has a PMH of obesity, HTN, mixed HLD, PAF on chronic anticoagulation (held starting 12/2), CKD Stage V, CAD s/p CABG, hx of ascending aortic dissection s/p repair 1974, s/p bioprosthetic aortic valve, and SVT. He had elective bidirectional endoscopies performed by Dr. Norma Fredrickson yesterday afternoon for further work-up of iron-deficiency anemia. Colonoscopy removed 15 polyps yesterday with multiple hemostatic clips placed. He went home after the procedure and ate a regular portion of chicken pot pie with his family. He reports he was walking his son out of the house yesterday evening ~1945 when he suddenly felt urge to have a bowel movement. However, all he passed was bright red and dark red blood with multiple blood clots. He had blood all over his legs, groin, underwear, and pants. He called the [facility] After Hours Line and spoke with me. He was not experiencing any chest pain, shortness of breath, abdominal pain, dizziness, or presyncopal episodes. I advised him to continue to monitor closely and if he had another episode would need to present to the ED. He reports he had another episode of hematochezia within an hour of the first episode and presented to the [facility] ED. Upon presentation to the ED, he was hypertensive (160/74) and otherwise normal vital signs. Labs significant for hemoglobin 10.6, BUN 77, serum creatinine 4.30 (baseline), and INR 1.3. He was admitted to the floor for GI bleed. GI consulted for further evaluation and management. Patient seen and examined this morning resting in ED room. He reports overnight he has continued to pass small amounts of dark red blood with clots in his brief and in the bed.  Whenever he feels like he is passing gas he also passed blood. He does endorse weakness, but denies any chest pain, shortness of breath, abdominal pain, melena, or presyncopal episodes. Hemoglobin was 8.0 around 0400 this morning. No history of GI bleeding. His last dose of Eliquis was on 12/1.    Summary of GI Procedures:  CSY 12/11/2022: Impression:             - Non-bleeding internal hemorrhoids. - Moderate diverticulosis in the entire examined colon. There was no evidence of diverticular bleeding. - Two 6 to 9 mm polyps in the cecum, removed with a hot snare. Resected and retrieved. - Two 10 to 12 mm polyps in the proximal ascending colon, removed with a hot snare. Resected and retrieved. Clips (MR conditional) were placed. Clip manufacturer: [employer]. - Two 6 to 8 mm polyps in the proximal ascending colon, removed with a hot snare. Resected and retrieved. - One 12 mm polyp in the mid ascending colon, removed with a hot snare. Resected and retrieved. Clip (MR conditional) was placed. Clip manufacturer: [employer]. - Three 10 to 14 mm polyps in the transverse colon, removed with a hot snare. Resected and retrieved. - Three 8 to 12 mm polyps in the descending colon, removed with a hot snare. Resected and retrieved. - Two 9 to 12 mm polyps in the sigmoid colon, removed with a hot snare. Resected and retrieved. - The examination was otherwise normal.   EGD 12/11/2022: Impression:             - Normal esophagus. - Gastritis. Biopsied. -  Portal hypertensive gastropathy. - Congested duodenal mucosa. - Erosive duodenopathy without bleeding. - Normal second portion of the duodenum and third portion of the duodenum. - The examination was otherwise normal.  Past Medical History:  Past Medical History:  Diagnosis Date   A-fib (HCC)    Allergic rhinitis    CAD (coronary artery disease)    Chronic kidney disease    Hypertension     Problem List: Patient Active Problem List    Diagnosis Date Noted   Colonoscopy with polypectomy 12/11/22 with post-procedural bleeding 12/12/2022   Hematochezia 12/12/2022   Chronic anticoagulation 12/12/2022   CKD (chronic kidney disease) stage 5, GFR less than 15 ml/min (HCC) 12/12/2022   A-fib (HCC)    CAD s/p CABG (coronary artery disease)    Anemia in chronic kidney disease (CKD) 07/09/2022   IDA (iron deficiency anemia) 07/09/2022   Knee pain 02/19/2016   CKD (chronic kidney disease) stage 4, GFR 15-29 ml/min (HCC) 10/17/2010   Hypertension 10/17/2010    Past Surgical History: Past Surgical History:  Procedure Laterality Date   AORTIC VALVE REPAIR     AV FISTULA PLACEMENT Left 02/2022   CORONARY ARTERY BYPASS GRAFT      Allergies: Allergies  Allergen Reactions   Phenothiazines Other (See Comments)    Extra-pyramidal reaction.   Extra-pyramidal reaction   Prochlorperazine Edisylate     Home Medications: (Not in a hospital admission)  Home medication reconciliation was completed with the patient.   Scheduled Inpatient Medications:    atorvastatin  20 mg Oral Daily   losartan  50 mg Oral Daily   metoprolol succinate  150 mg Oral BID   pantoprazole  40 mg Oral Daily   sodium bicarbonate  650 mg Oral BID   tamsulosin  0.4 mg Oral QPC supper    Continuous Inpatient Infusions:    PRN Inpatient Medications:  acetaminophen **OR** acetaminophen, HYDROcodone-acetaminophen, morphine injection, ondansetron **OR** ondansetron (ZOFRAN) IV  Family History: family history includes Aneurysm in his mother; Heart attack in his brother, father, and sister.  The patient's family history is negative for inflammatory bowel disorders, GI malignancy, or solid organ transplantation.  Social History:   reports that he has quit smoking. He has never used smokeless tobacco. He reports that he does not drink alcohol and does not use drugs. The patient denies ETOH, tobacco, or drug use.   Review of Systems: Constitutional:  Weight is stable.  Eyes: No changes in vision. ENT: No oral lesions, sore throat.  GI: see HPI.  Heme/Lymph: No easy bruising.  CV: No chest pain.  GU: No hematuria.  Integumentary: No rashes.  Neuro: No headaches.  Psych: No depression/anxiety.  Endocrine: No heat/cold intolerance.  Allergic/Immunologic: No urticaria.  Resp: No cough, SOB.  Musculoskeletal: No joint swelling.    Physical Examination: BP 124/66 (BP Location: Right Arm)   Pulse 66   Temp 97.6 F (36.4 C) (Oral)   Resp 12   Ht 5\' 9"  (1.753 m)   Wt 76.2 kg   SpO2 100%   BMI 24.81 kg/m  Gen: NAD, alert and oriented x 4 HEENT: PEERLA, EOMI, Neck: supple, no JVD or thyromegaly Chest: CTA bilaterally, no wheezes, crackles, or other adventitious sounds CV: RRR, no m/g/c/r Abd: soft, NT, ND, +BS in all four quadrants; no HSM, guarding, ridigity, or rebound tenderness Ext: no edema, well perfused with 2+ pulses, Skin: no rash or lesions noted Lymph: no LAD  Data: Lab Results  Component Value Date   WBC  4.7 12/11/2022   HGB 8.0 (L) 12/12/2022   HCT 34.7 (L) 12/11/2022   MCV 99.1 12/11/2022   PLT 154 12/11/2022   Recent Labs  Lab 12/11/22 2140 12/12/22 0418  HGB 10.6* 8.0*   Lab Results  Component Value Date   NA 141 12/12/2022   K 3.8 12/12/2022   CL 111 12/12/2022   CO2 19 (L) 12/12/2022   BUN 77 (H) 12/12/2022   CREATININE 4.26 (H) 12/12/2022   Lab Results  Component Value Date   ALT 10 07/09/2022   AST 13 (L) 07/09/2022   ALKPHOS 56 07/09/2022   BILITOT 0.4 07/09/2022   Recent Labs  Lab 12/12/22 0142  INR 1.3*    Assessment/Plan:  82 y/o Caucasian male with a PMH of obesity, HTN, mixed HLD, PAF on chronic anticoagulation (held starting 12/2), CKD Stage V, CAD s/p CABG, hx of ascending aortic dissection s/p repair 1974, s/p bioprosthetic aortic valve, and SVT presented to the [facility] ED overnight for chief complaint of hematochezia c/w PPB  Post-polypectomy bleed (PPB)/Hematochezia -  clinical presentation highly c/w post-polypectomy bleed. Hemoglobin 10.6 upon arrival has decreased to 8.0 this morning. HDS currently. Concern for ongoing bleeding.   Acute blood loss anemia  Hx of adenomatous colon polyps  IDA/Anemia of chronic disease  Chronic anticoagulation - held starting 12/2 for colonoscopy  CAD s/p CABG  CKD Stage V  Recommendations:  - Maintain 2 large bore IVs for access - Continue to monitor serial H&H. Transfuse for Hgb <7.0.  - Continue supportive care per primary team - Recommend repeat colonoscopy today with Dr. Norma Fredrickson for endoscopic hemostasis (hemostatic clips, bipolar cautery, hemospray, etc) - NPO - Avoid all anticoagulation/antiplatelet therapy - Colonoscopy planned for ~12 PM today with Dr. Norma Fredrickson in endoscopy suite - See procedure note for findings and further recs  I reviewed the risks (including bleeding, perforation, infection, anesthesia complications, cardiac/respiratory complications), benefits and alternatives of colonoscopy. Patient consents to proceed.    Thank you for the consult. Please call with questions or concerns.  Gilda Crease, PA-C [facility] [phone]

## 2022-12-12 NOTE — Progress Notes (Signed)
  INTERVAL PROGRESS NOTE    Terry Stephens Sr.- 82 y.o. male  LOS: 0 __________________________________________________________________  SUBJECTIVE: Admitted 12/12/2022 with cc of  Chief Complaint  Patient presents with   Rectal Bleeding   Since admission, had enema in anticipation of colonoscopy with loss of large amount of bright red blood per rectum.  Patient had acute hypotension and dizziness on commode.  Blood pressure as low as 50s systolics according to nurse report.  He was brought back to the bed and started on emergent release 2 units blood transfusion as well as 1 L NS.  He remained alert and conscious throughout this episode.  Denied abdominal pain following that episode.  Continue to ooze bright red blood per rectum but remained hemodynamically stable at that point.  Weighing the pros and cons discussed with GI who recommended bringing to endoscopy lab to address the source of the bleed urgently.  OBJECTIVE: Blood pressure 124/66, pulse 66, temperature 97.6 F (36.4 C), temperature source Oral, resp. rate 12, height 5\' 9"  (1.753 m), weight 76.2 kg, SpO2 100%.  General: NAD, pleasant, able to participate in exam Cardiac: RRR, normal heart sounds, no murmurs. 2+ radial and PT pulses bilaterally Respiratory: CTAB, normal effort, No wheezes, rales or rhonchi Abdomen: soft, nontender, nondistended, no hepatic or splenomegaly, +BS Extremities: no edema. WWP. Skin: warm and dry, no rashes noted Neuro: alert and oriented, no focal deficits Psych: Normal affect and mood   ASSESSMENT/PLAN:  I have reviewed the full H&P by Dr. Para March, and I agree with the assessment and plan as outlined therein.  In addition: GI consulted- underwent emergent blood transfusion of 2 units followed by colonoscopy.  Interventions appear to have stabilized him. Transfusion threshold is less than 8 due to his history of CAD s/p CABG. Repeat hemoglobin after the 2 blood transfusions was delayed  due to bringing him to the endoscopy suite. Collect CBC when completed with his procedure.  Strict I/O  Principal Problem:   Colonoscopy with polypectomy 12/11/22 with post-procedural bleeding Active Problems:   Hematochezia   Chronic anticoagulation   IDA (iron deficiency anemia)   Anemia in chronic kidney disease (CKD)   A-fib (HCC)   CAD s/p CABG (coronary artery disease)   Hypertension   CKD (chronic kidney disease) stage 5, GFR less than 15 ml/min (HCC)   Leeroy Bock, DO [facility] 12/12/2022, 8:40 AM    [email or web address] Available by Epic secure chat 7AM-7PM. If 7PM-7AM, please contact night-coverage   No Charge

## 2022-12-12 NOTE — ED Notes (Signed)
Report given to Dava, RN.

## 2022-12-12 NOTE — ED Notes (Signed)
Enema discussed with pt and pt concerned about more bleeding from rectum due to already having issues bleeding, MD's aware.   As further discussed with admitting MD and PA- one is okay to give now for clean out purposes for colonoscopy.

## 2022-12-12 NOTE — Assessment & Plan Note (Signed)
Continue GDMT with metoprolol, losartan Holding Eliquis due to GI bleed

## 2022-12-12 NOTE — Progress Notes (Signed)
[facility] GI inpatient brief Progress Note  Patient seen for acute hypotensive event during enema. Patient BP dropped into the 50's reportedly per RN, increased to SBP 100 with IVF's. Patient had multiple bloody bm's overnight. See consultation done today by G[employer], PA-C. Patient denies abdominal pain currently. Nurse Jacquelyn at bedside. Patient wife and niece also at bedside.  Vitals:   12/12/22 1154 12/12/22 1200  BP: (!) 100/57 (!) 97/53  Pulse: 79 79  Resp: 17 18  Temp: 97.6 F (36.4 C)   SpO2: 100% 100%       Labs:    Latest Ref Rng & Units 12/12/2022    4:18 AM 12/11/2022    9:40 PM 11/11/2022    1:40 PM  CBC  WBC 4.0 - 10.5 K/uL  4.7  3.5   Hemoglobin 13.0 - 17.0 g/dL 8.0  40.9  9.1   Hematocrit 39.0 - 52.0 %  34.7  29.0   Platelets 150 - 400 K/uL  154  117     CMP     Component Value Date/Time   NA 141 12/12/2022 0418   K 3.8 12/12/2022 0418   CL 111 12/12/2022 0418   CO2 19 (L) 12/12/2022 0418   GLUCOSE 103 (H) 12/12/2022 0418   BUN 77 (H) 12/12/2022 0418   CREATININE 4.26 (H) 12/12/2022 0418   CALCIUM 7.7 (L) 12/12/2022 0418   PROT 6.7 07/09/2022 1157   ALBUMIN 3.7 07/09/2022 1157   AST 13 (L) 07/09/2022 1157   ALT 10 07/09/2022 1157   ALKPHOS 56 07/09/2022 1157   BILITOT 0.4 07/09/2022 1157   GFRNONAA 13 (L) 12/12/2022 0418   GFRAA (L) 07/01/2006 1132    51        The eGFR has been calculated using the MDRD equation. This calculation has not been validated in all clinical      Impression:   Post polypectomy bleeding -Colonoscopy performed yesterday by me, 12/11/22. Bleeding is intermittent but ongoing, with some hemodynamic changes. 2.    Actue blood loss anemia - Transfusions running currently. 3.    CAD 4.    CKD stage V   Plan:   Discussed with attending, Dr. Dareen Piano, who advises waiting on colonoscopy previously planned for this hour. 2.   Strive to achieve hemodynamic stability. 3.   I remain poised to intervene with  colonoscopy and hemostasis when clinically feasible. Following case closely.  Case and plans were discussed with the attending physician, family and patient who all voice agreement with plan.   Thank you  T. Bing Plume, M.D. ABIM Diplomate in Gastroenterology [facility] [phone] - Cell

## 2022-12-12 NOTE — ED Notes (Signed)
This RN at bedside. Pt with soiled brief. Small amount of bright red blood noted with small blood clots. Pt cleaned and new brief ion place. Pt repositioned at this time. Pt denies any further needs.

## 2022-12-12 NOTE — Assessment & Plan Note (Signed)
 Renal function at baseline

## 2022-12-12 NOTE — Assessment & Plan Note (Signed)
Continue losartan and metoprolol Close monitoring of blood pressure in the setting of GI bleed

## 2022-12-12 NOTE — ED Notes (Signed)
Endoscopy called this RN to report plan for procedure tomorrow around 12pm. Pt and family updated.

## 2022-12-12 NOTE — H&P (View-Only) (Signed)
 [facility] GI inpatient brief Progress Note  Patient seen for acute hypotensive event during enema. Patient BP dropped into the 50's reportedly per RN, increased to SBP 100 with IVF's. Patient had multiple bloody bm's overnight. See consultation done today by G. [employer], PA-C. Patient denies abdominal pain currently. Nurse Jacquelyn at bedside. Patient wife and niece also at bedside.  Vitals:   12/12/22 1154 12/12/22 1200  BP: (!) 100/57 (!) 97/53  Pulse: 79 79  Resp: 17 18  Temp: 97.6 F (36.4 C)   SpO2: 100% 100%       Labs:    Latest Ref Rng & Units 12/12/2022    4:18 AM 12/11/2022    9:40 PM 11/11/2022    1:40 PM  CBC  WBC 4.0 - 10.5 K/uL  4.7  3.5   Hemoglobin 13.0 - 17.0 g/dL 8.0  40.9  9.1   Hematocrit 39.0 - 52.0 %  34.7  29.0   Platelets 150 - 400 K/uL  154  117     CMP     Component Value Date/Time   NA 141 12/12/2022 0418   K 3.8 12/12/2022 0418   CL 111 12/12/2022 0418   CO2 19 (L) 12/12/2022 0418   GLUCOSE 103 (H) 12/12/2022 0418   BUN 77 (H) 12/12/2022 0418   CREATININE 4.26 (H) 12/12/2022 0418   CALCIUM 7.7 (L) 12/12/2022 0418   PROT 6.7 07/09/2022 1157   ALBUMIN 3.7 07/09/2022 1157   AST 13 (L) 07/09/2022 1157   ALT 10 07/09/2022 1157   ALKPHOS 56 07/09/2022 1157   BILITOT 0.4 07/09/2022 1157   GFRNONAA 13 (L) 12/12/2022 0418   GFRAA (L) 07/01/2006 1132    51        The eGFR has been calculated using the MDRD equation. This calculation has not been validated in all clinical      Impression:   Post polypectomy bleeding -Colonoscopy performed yesterday by me, 12/11/22. Bleeding is intermittent but ongoing, with some hemodynamic changes. 2.    Actue blood loss anemia - Transfusions running currently. 3.    CAD 4.    CKD stage V   Plan:   Discussed with attending, Dr. Dareen Piano, who advises waiting on colonoscopy previously planned for this hour. 2.   Strive to achieve hemodynamic stability. 3.   I remain poised to intervene with  colonoscopy and hemostasis when clinically feasible. Following case closely.  Case and plans were discussed with the attending physician, family and patient who all voice agreement with plan.   Thank you  T. Bing Plume, M.D. ABIM Diplomate in Gastroenterology [facility] [phone] - Cell

## 2022-12-12 NOTE — ED Notes (Signed)
Spoke with NP Jon Billings regarding pt bleeding episode. No new orders at this time.

## 2022-12-12 NOTE — ED Notes (Signed)
Paper blood consent filed with pts chart

## 2022-12-12 NOTE — ED Notes (Signed)
This RN gave tap water enema after a conversation with GI staff and IP MD about pt's loss of blood prior to enema being ordered and this RN and pt having concerns for this.    Pt tolerated enema well, pt requests  to use to use bedside toilet after enema, pt transferred to toilet with no issues and pt did have a large amount of bloody stool and clots noted in toilet, pt became and pale and diaphoretic  and states "I feel like I am going to pass out". Staff assist called and EDP asked to come assist with care until this RN could notify IP MD, pt placed back in bed, initial BP was 55/43, IV placed NS bolused in with pressure bag and emergency blood ordered.   Prior to enema pt had a saturated brief with a large amount blood and clots, procedure RN showed prior to changing to pt and giving enema.

## 2022-12-12 NOTE — ED Notes (Signed)
Pt called out requesting help. Upon entering room moderate amount of frank red blood with clots noted to have passed from rectum. Pt cleaned and brief and linens changed. Pt now resting in bed free from sign of distress. Bed low and locked. Monitor in place. Spouse at bedside.

## 2022-12-12 NOTE — Progress Notes (Signed)
       CROSS COVER NOTE  NAME: Terry Giannakopoulos Stiggers Sr. MRN: [id number] DOB : 1940/04/17    Concern as stated by nurse / staff    Paged to bedside Patient with bright red blood per recturm (reason for admission) and now with soft pressures    Pertinent findings on chart review: Post colonoscopy with polypectomy hemorrhage 2 units of blood ready for transfusion   Assessment and  Interventions   Assessment:    12/12/2022   11:30 PM 12/12/2022   11:25 PM 12/12/2022   11:20 PM  Vitals with BMI  Systolic 83 93 90  Diastolic 48 49 53  Pulse 97 101 101    Plan: Infuse 2 units of blood previously ordered 2 units of blood placed on hold AAT for continued hemorrhage Instructed nursing to inform on call GI - I spoke with Dr Timothy Lasso on phone. 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

## 2022-12-12 NOTE — ED Notes (Signed)
Dr. Anderson at bedside.

## 2022-12-12 NOTE — ED Provider Notes (Signed)
[facility] Provider Note    Event Date/Time   First MD Initiated Contact with Patient 12/12/22 0041     (approximate)   History   Rectal Bleeding   HPI  Terry LOPEZRAMIREZ Sr. is a 82 y.o. male who presents to the ED from home with a chief complaint of rectal bleeding.  Patient had colonoscopy done yesterday for evaluation of anemia requiring iron transfusions.  Several polyps were taken per the postoperative notes.  Patient developed rectal bleeding with dark blood clots tonight at home.  He does take Eliquis; it was held for several days prior to the procedure and he has not restarted it yet.  Endorses mild lightheadedness.  Denies chest pain, shortness of breath, abdominal pain, nausea or vomiting.     Past Medical History   Past Medical History:  Diagnosis Date   A-fib [facility])    Allergic rhinitis    CAD (coronary artery disease)    Chronic kidney disease    Hypertension      Active Problem List   Patient Active Problem List   Diagnosis Date Noted   Anemia in chronic kidney disease (CKD) 07/09/2022   IDA (iron deficiency anemia) 07/09/2022   Knee pain 02/19/2016   CKD (chronic kidney disease) stage 4, GFR 15-29 ml/min (HCC) 10/17/2010   Hypertension 10/17/2010     Past Surgical History   Past Surgical History:  Procedure Laterality Date   AORTIC VALVE REPAIR     AV FISTULA PLACEMENT Left 02/2022   CORONARY ARTERY BYPASS GRAFT       Home Medications   Prior to Admission medications   Medication Sig Start Date End Date Taking? Authorizing Provider  Acetaminophen 500 MG coapsule 500 mg  prn as needed 09/17/07   [provider]  amoxicillin (AMOXIL) 500 MG capsule     [provider]  atorvastatin (LIPITOR) 20 MG tablet Take 1 tablet (20 mg total) by mouth daily. 05/29/22   Antonieta Iba, MD  Cholecalciferol (VITAMIN D3) 2000 units capsule Take by mouth.    [provider]  diclofenac Sodium (VOLTAREN)  1 % GEL Apply topically 4 (four) times daily as needed.    [provider]  ELIQUIS 2.5 MG TABS tablet Take 1 tablet (2.5 mg total) by mouth 2 (two) times daily. 05/29/22   Antonieta Iba, MD  esomeprazole (NEXIUM) 40 MG capsule Take 40 mg by mouth as needed.    [provider]  fluticasone (FLONASE) 50 MCG/ACT nasal spray Place into the nose. 04/25/09   [provider]  furosemide (LASIX) 40 MG tablet Take 1 tablet (40 mg total) by mouth 2 (two) times daily. 05/29/22   Antonieta Iba, MD  losartan (COZAAR) 50 MG tablet Take 1 tablet (50 mg total) by mouth daily. 05/29/22   Antonieta Iba, MD  metoprolol succinate (TOPROL-XL) 100 MG 24 hr tablet Take 1 tablet (100 mg total) by mouth 2 (two) times daily. Take with or immediately following a meal. Patient taking differently: Take 150 mg by mouth 2 (two) times daily. Take with or immediately following a meal. 05/29/22   Gollan, Tollie Pizza, MD  psyllium (METAMUCIL) 0.52 g capsule     [provider]  Saw Palmetto 450 MG CAPS Take 450 mg by mouth daily.    [provider]  sodium bicarbonate 650 MG tablet Take 650 mg by mouth 2 (two) times daily.    [provider]  tacrolimus (PROTOPIC) 0.1 %  ointment Apply topically. 08/17/12   [provider]  tamsulosin (FLOMAX) 0.4 MG CAPS capsule Take 0.4 mg by mouth daily after supper.    [provider]     Allergies  Phenothiazines and Prochlorperazine edisylate   Family History   Family History  Problem Relation Age of Onset   Aneurysm Mother    Heart attack Father    Heart attack Sister    Heart attack Brother      Physical Exam  Triage Vital Signs: ED Triage Vitals  Encounter Vitals Group     BP 12/11/22 2136 (!) 160/76     Systolic BP Percentile --      Diastolic BP Percentile --      Pulse Rate 12/11/22 2136 67     Resp 12/11/22 2136 18     Temp 12/11/22 2136 (!) 97.5 F (36.4 C)     Temp src --      SpO2  12/11/22 2136 100 %     Weight 12/11/22 2135 168 lb (76.2 kg)     Height 12/11/22 2135 5\' 9"  (1.753 m)     Head Circumference --      Peak Flow --      Pain Score 12/11/22 2135 0     Pain Loc --      Pain Education --      Exclude from Growth Chart --     Updated Vital Signs: BP (!) 159/69 (BP Location: Right Arm)   Pulse 70   Temp (!) 97.5 F (36.4 C) (Oral) Comment: warm blankets provided  Resp 12   Ht 5\' 9"  (1.753 m)   Wt 76.2 kg   SpO2 100%   BMI 24.81 kg/m    General: Awake, mild distress.  CV:  RRR.  Good peripheral perfusion.  Resp:  Normal effort.  CTAB. Abd:  Nontender to light or deep palpation.  No distention.  Other:  No truncal vesicles.   ED Results / Procedures / Treatments  Labs (all labs ordered are listed, but only abnormal results are displayed) Labs Reviewed  CBC WITH DIFFERENTIAL/PLATELET - Abnormal; Notable for the following components:      Result Value   RBC 3.50 (*)    Hemoglobin 10.6 (*)    HCT 34.7 (*)    All other components within normal limits  BASIC METABOLIC PANEL - Abnormal; Notable for the following components:   CO2 16 (*)    Glucose, Bld 129 (*)    BUN 77 (*)    Creatinine, Ser 4.30 (*)    Calcium 8.1 (*)    GFR, Estimated 13 (*)    All other components within normal limits  PROTIME-INR  TYPE AND SCREEN     EKG  ED ECG REPORT I, Yeng Perz J, the attending physician, personally viewed and interpreted this ECG.   Date: 12/12/2022  EKG Time: 0124  Rate: 76  Rhythm: normal sinus rhythm  Axis: Normal  Intervals: PVCs  ST&T Change: Nonspecific    RADIOLOGY None   Official radiology report(s): No results found.   PROCEDURES:  Critical Care performed: No  .1-3 Lead EKG Interpretation  Performed by: Irean Hong, MD Authorized by: Irean Hong, MD     Interpretation: normal     ECG rate:  70   ECG rate assessment: normal     Rhythm: sinus rhythm     Ectopy: none     Conduction: normal   Comments:      Patient placed  on cardiac monitor to evaluate for arrhythmias  Rectal exam: No active bleeding currently.  Gross blood noted on Chux.   MEDICATIONS ORDERED IN ED: Medications - No data to display   IMPRESSION / MDM / ASSESSMENT AND PLAN / ED COURSE  I reviewed the triage vital signs and the nursing notes.                             82 year old male presenting with post rectal bleed. Differential diagnosis includes, but is not limited to, post polypectomy bleeding, acute appendicitis, renal colic, testicular torsion, urinary tract infection/pyelonephritis, prostatitis,  epididymitis, diverticulitis, small bowel obstruction or ileus, colitis, abdominal aortic aneurysm, gastroenteritis, hernia, etc. I personally reviewed patient's records and note his colonoscopy note from yesterday.  Patient's presentation is most consistent with acute presentation with potential threat to life or bodily function.  The patient is on the cardiac monitor to evaluate for evidence of arrhythmia and/or significant heart rate changes.  Laboratory results demonstrate hemoglobin 10.6, stable chronic kidney disease.  Keep NPO, administer IV fluids and consult hospital services for evaluation and admission.      FINAL CLINICAL IMPRESSION(S) / ED DIAGNOSES   Final diagnoses:  Rectal bleeding  Colonoscopy causing post-procedural bleeding  Chronic kidney disease, unspecified CKD stage     Rx / DC Orders   ED Discharge Orders     None        Note:  This document was prepared using Dragon voice recognition software and may include unintentional dictation errors.   Irean Hong, MD 12/12/22 [phone]

## 2022-12-12 NOTE — Assessment & Plan Note (Addendum)
SVT Chronic anticoagulation Rate controlled Patient on high-dose Toprol 150 mg twice daily Holding Eliquis

## 2022-12-12 NOTE — Assessment & Plan Note (Addendum)
Hematochezia Serial H&H and transfuse if needed Will give an IV fluid bolus GI consult Close hemodynamic monitoring

## 2022-12-12 NOTE — H&P (Signed)
History and Physical    Patient: Terry Stephens AOZ:[id number] DOB: April 23, 1940 DOA: 12/12/2022 DOS: the patient was seen and examined on 12/12/2022 PCP: Terry Shaggy, MD  Patient coming from: Home  Chief Complaint:  Chief Complaint  Patient presents with   Rectal Bleeding    HPI: Terry LOSER Sr. is a 82 y.o. 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  Colonoscopy was indicated for IDA and he underwent  hot snare polypectomy of 4 polyps with placement of hemostatic clips.  He reports several hours following the procedure he felt like he was oozing blood and also started passing dark clots.  He was initially intermittent but seem to have become more continuous as the time progressed.  Of note, patient stopped taking Eliquis since 12/20 preparation for his colonoscopy and has not yet resumed.  He was however taking a baby aspirin as advised by his cardiologist who did preprocedural cardiac clearance.  He endorses mild lightheadedness but denies chest pain, shortness of breath or palpitations.  Denies abdominal pain. ED course and data review: Hemodynamically stable with BP 160/76 and pulse 67 Labs notable for hemoglobin 10.6.  Baseline of 9. Renal function at baseline with creatinine 4.3, bicarb 16 EKG, personally viewed and interpreted showing sinus at 76 with PVCs and RBBB Hospitalist consulted for admission   Review of Systems: As mentioned in the history of present illness. All other systems reviewed and are negative.  Past Medical History:  Diagnosis Date   A-fib [name])    Allergic rhinitis    CAD (coronary artery disease)    Chronic kidney disease    Hypertension    Past  Surgical History:  Procedure Laterality Date   AORTIC VALVE REPAIR     AV FISTULA PLACEMENT Left 02/2022   CORONARY ARTERY BYPASS GRAFT     Social History:  reports that he has quit smoking. He has never used smokeless tobacco. He reports that he does not drink alcohol and does not use drugs.  Allergies  Allergen Reactions   Phenothiazines Other (See Comments)    Extra-pyramidal reaction.   Extra-pyramidal reaction   Prochlorperazine Edisylate     Family History  Problem Relation Age of Onset   Aneurysm Mother    Heart attack Father    Heart attack Sister    Heart attack Brother     Prior to Admission medications   Medication Sig Start Date End Date Taking? Authorizing Provider  Acetaminophen 500 MG coapsule 500 mg  prn as needed 09/17/07   [provider]  amoxicillin (AMOXIL) 500 MG capsule     [provider]  atorvastatin (LIPITOR) 20 MG tablet Take 1 tablet (20 mg total) by mouth daily. 05/29/22   Terry Iba, MD  Cholecalciferol (VITAMIN D3) 2000 units capsule Take by mouth.    [provider]  diclofenac Sodium (VOLTAREN) 1 % GEL Apply topically 4 (four) times daily as needed.    [provider]  ELIQUIS 2.5 MG TABS tablet Take 1 tablet (2.5 mg total) by mouth 2 (two) times daily. 05/29/22   Terry Iba, MD  esomeprazole (NEXIUM) 40 MG capsule Take 40 mg by mouth as needed.    [provider]  fluticasone Aleda Grana)  50 MCG/ACT nasal spray Place into the nose. 04/25/09   [provider]  furosemide (LASIX) 40 MG tablet Take 1 tablet (40 mg total) by mouth 2 (two) times daily. 05/29/22   Terry Iba, MD  losartan (COZAAR) 50 MG tablet Take 1 tablet (50 mg total) by mouth daily. 05/29/22   Terry Iba, MD  metoprolol succinate (TOPROL-XL) 100 MG 24 hr tablet Take 1 tablet (100 mg total) by mouth 2 (two) times daily. Take with or immediately following a meal. Patient taking differently: Take 150 mg by  mouth 2 (two) times daily. Take with or immediately following a meal. 05/29/22   Gollan, Tollie Pizza, MD  psyllium (METAMUCIL) 0.52 g capsule     [provider]  Saw Palmetto 450 MG CAPS Take 450 mg by mouth daily.    [provider]  sodium bicarbonate 650 MG tablet Take 650 mg by mouth 2 (two) times daily.    [provider]  tacrolimus (PROTOPIC) 0.1 % ointment Apply topically. 08/17/12   [provider]  tamsulosin (FLOMAX) 0.4 MG CAPS capsule Take 0.4 mg by mouth daily after supper.    [provider]    Physical Exam: Vitals:   12/11/22 2135 12/11/22 2136 12/12/22 0050 12/12/22 0051  BP:  (!) 160/76 (!) 159/69   Pulse:  67 70   Resp:  18 12   Temp:  (!) 97.5 F (36.4 C) (!) 97.5 F (36.4 C)   TempSrc:   Oral   SpO2:  100% 100% 100%  Weight: 76.2 kg     Height: 5\' 9"  (1.753 m)      Physical Exam Vitals and nursing note reviewed.  Constitutional:      General: He is not in acute distress. HENT:     Head: Normocephalic and atraumatic.  Cardiovascular:     Rate and Rhythm: Normal rate and regular rhythm.     Heart sounds: Normal heart sounds.  Pulmonary:     Effort: Pulmonary effort is normal.     Breath sounds: Normal breath sounds.  Abdominal:     Palpations: Abdomen is soft.     Tenderness: There is no abdominal tenderness.  Neurological:     Mental Status: Mental status is at baseline.     Labs on Admission: I have personally reviewed following labs and imaging studies  CBC: Recent Labs  Lab 12/11/22 2140  WBC 4.7  NEUTROABS 2.8  HGB 10.6*  HCT 34.7*  MCV 99.1  PLT 154   Basic Metabolic Panel: Recent Labs  Lab 12/11/22 2140  NA 139  K 3.5  CL 108  CO2 16*  GLUCOSE 129*  BUN 77*  CREATININE 4.30*  CALCIUM 8.1*   GFR: Estimated Creatinine Clearance: 13 mL/min (A) (by C-G formula based on SCr of 4.3 mg/dL (H)). Liver Function Tests: No results for input(s): "AST", "ALT", "ALKPHOS", "BILITOT", "PROT",  "ALBUMIN" in the last 168 hours. No results for input(s): "LIPASE", "AMYLASE" in the last 168 hours. No results for input(s): "AMMONIA" in the last 168 hours. Coagulation Profile: No results for input(s): "INR", "PROTIME" in the last 168 hours. Cardiac Enzymes: No results for input(s): "CKTOTAL", "CKMB", "CKMBINDEX", "TROPONINI" in the last 168 hours. BNP (last 3 results) No results for input(s): "PROBNP" in the last 8760 hours. HbA1C: No results for input(s): "HGBA1C" in the last 72 hours. CBG: No results for input(s): "GLUCAP" in the last 168 hours. Lipid Profile: No results for input(s): "CHOL", "HDL", "LDLCALC", "TRIG", "CHOLHDL", "  LDLDIRECT" in the last 72 hours. Thyroid Function Tests: No results for input(s): "TSH", "T4TOTAL", "FREET4", "T3FREE", "THYROIDAB" in the last 72 hours. Anemia Panel: No results for input(s): "VITAMINB12", "FOLATE", "FERRITIN", "TIBC", "IRON", "RETICCTPCT" in the last 72 hours. Urine analysis: No results found for: "COLORURINE", "APPEARANCEUR", "LABSPEC", "PHURINE", "GLUCOSEU", "HGBUR", "BILIRUBINUR", "KETONESUR", "PROTEINUR", "UROBILINOGEN", "NITRITE", "LEUKOCYTESUR"  Radiological Exams on Admission: No results found.   Data Reviewed: Relevant notes from primary care and specialist visits, past discharge summaries as available in EHR, including Care Everywhere. Prior diagnostic testing as pertinent to current admission diagnoses Updated medications and problem lists for reconciliation ED course, including vitals, labs, imaging, treatment and response to treatment Triage notes, nursing and pharmacy notes and ED provider's notes Notable results as noted in HPI   Assessment and Plan: * Colonoscopy with polypectomy 12/11/22 with post-procedural bleeding Hematochezia Serial H&H and transfuse if needed Will give an IV fluid bolus GI consult Close hemodynamic monitoring  IDA (iron deficiency anemia) Anemia in chronic kidney disease Hemoglobin at  baseline Has required iron transfusions in the past Continue to monitor  A-fib [facility]) SVT Chronic anticoagulation Rate controlled Patient on high-dose Toprol 150 mg twice daily Holding Eliquis  CAD s/p CABG (coronary artery disease) Continue GDMT with metoprolol, losartan Holding Eliquis due to GI bleed  CKD (chronic kidney disease) stage 5, GFR less than 15 ml/min (HCC) Renal function at baseline  Hypertension Continue losartan and metoprolol Close monitoring of blood pressure in the setting of GI bleed    DVT prophylaxis: SCD  Consults: GI  Advance Care Planning: full code  Family Communication: Wife at bedside  Disposition Plan: Back to previous home environment  Severity of Illness: The appropriate patient status for this patient is OBSERVATION. Observation status is judged to be reasonable and necessary in order to provide the required intensity of service to ensure the patient's safety. The patient's presenting symptoms, physical exam findings, and initial radiographic and laboratory data in the context of their medical condition is felt to place them at decreased risk for further clinical deterioration. Furthermore, it is anticipated that the patient will be medically stable for discharge from the hospital within 2 midnights of admission.   Author: Andris Baumann, MD 12/12/2022 2:19 AM  For on call review [email or web address].

## 2022-12-13 ENCOUNTER — Encounter: Payer: Self-pay | Admitting: Student in an Organized Health Care Education/Training Program

## 2022-12-13 ENCOUNTER — Inpatient Hospital Stay: Payer: Medicare Other | Admitting: General Practice

## 2022-12-13 ENCOUNTER — Inpatient Hospital Stay: Payer: Medicare Other

## 2022-12-13 ENCOUNTER — Encounter
Admission: EM | Disposition: A | Discharge status: Home Health | Payer: Self-pay | Source: Home / Self Care | Attending: Student

## 2022-12-13 ENCOUNTER — Other Ambulatory Visit: Payer: Self-pay

## 2022-12-13 DIAGNOSIS — R58 Hemorrhage, not elsewhere classified: Secondary | ICD-10-CM | POA: Diagnosis not present

## 2022-12-13 DIAGNOSIS — N179 Acute kidney failure, unspecified: Secondary | ICD-10-CM

## 2022-12-13 DIAGNOSIS — R571 Hypovolemic shock: Secondary | ICD-10-CM | POA: Diagnosis not present

## 2022-12-13 DIAGNOSIS — I4891 Unspecified atrial fibrillation: Secondary | ICD-10-CM

## 2022-12-13 DIAGNOSIS — K91840 Postprocedural hemorrhage and hematoma of a digestive system organ or structure following a digestive system procedure: Secondary | ICD-10-CM | POA: Diagnosis not present

## 2022-12-13 HISTORY — PX: COLONOSCOPY: SHX5424

## 2022-12-13 LAB — CBC
HCT: 24.9 % — ABNORMAL LOW (ref 39.0–52.0)
HCT: 21.1 % — ABNORMAL LOW (ref 39.0–52.0)
Hemoglobin: 8.4 g/dL — ABNORMAL LOW (ref 13.0–17.0)
Hemoglobin: 7.1 g/dL — ABNORMAL LOW (ref 13.0–17.0)
MCH: 30.8 pg (ref 26.0–34.0)
MCH: 29.7 pg (ref 26.0–34.0)
MCHC: 33.7 g/dL (ref 30.0–36.0)
MCHC: 33.6 g/dL (ref 30.0–36.0)
MCV: 91.2 fL (ref 80.0–100.0)
MCV: 88.3 fL (ref 80.0–100.0)
Platelets: 114 10*3/uL — ABNORMAL LOW (ref 150–400)
Platelets: 93 10*3/uL — ABNORMAL LOW (ref 150–400)
RBC: 2.73 MIL/uL — ABNORMAL LOW (ref 4.22–5.81)
RBC: 2.39 MIL/uL — ABNORMAL LOW (ref 4.22–5.81)
RDW: 16 % — ABNORMAL HIGH (ref 11.5–15.5)
RDW: 18.4 % — ABNORMAL HIGH (ref 11.5–15.5)
WBC: 7.8 10*3/uL (ref 4.0–10.5)
WBC: 6 10*3/uL (ref 4.0–10.5)
nRBC: 0 % (ref 0.0–0.2)
nRBC: 0.5 % — ABNORMAL HIGH (ref 0.0–0.2)

## 2022-12-13 LAB — CBC WITH DIFFERENTIAL/PLATELET
Abs Immature Granulocytes: 0.03 10*3/uL (ref 0.00–0.07)
Basophils Absolute: 0 10*3/uL (ref 0.0–0.1)
Basophils Relative: 0 %
Eosinophils Absolute: 0.1 10*3/uL (ref 0.0–0.5)
Eosinophils Relative: 1 %
HCT: 27.4 % — ABNORMAL LOW (ref 39.0–52.0)
Hemoglobin: 9.2 g/dL — ABNORMAL LOW (ref 13.0–17.0)
Immature Granulocytes: 0 %
Lymphocytes Relative: 8 %
Lymphs Abs: 0.6 10*3/uL — ABNORMAL LOW (ref 0.7–4.0)
MCH: 30 pg (ref 26.0–34.0)
MCHC: 33.6 g/dL (ref 30.0–36.0)
MCV: 89.3 fL (ref 80.0–100.0)
Monocytes Absolute: 0.9 10*3/uL (ref 0.1–1.0)
Monocytes Relative: 12 %
Neutro Abs: 6 10*3/uL (ref 1.7–7.7)
Neutrophils Relative %: 79 %
Platelets: 81 10*3/uL — ABNORMAL LOW (ref 150–400)
RBC: 3.07 MIL/uL — ABNORMAL LOW (ref 4.22–5.81)
RDW: 15.7 % — ABNORMAL HIGH (ref 11.5–15.5)
Smear Review: NORMAL
WBC: 7.6 10*3/uL (ref 4.0–10.5)
nRBC: 0.7 % — ABNORMAL HIGH (ref 0.0–0.2)

## 2022-12-13 LAB — HEMOGLOBIN AND HEMATOCRIT, BLOOD
HCT: 27.7 % — ABNORMAL LOW (ref 39.0–52.0)
Hemoglobin: 9.1 g/dL — ABNORMAL LOW (ref 13.0–17.0)

## 2022-12-13 LAB — COMPREHENSIVE METABOLIC PANEL
ALT: 11 U/L (ref 0–44)
ALT: 8 U/L (ref 0–44)
AST: 12 U/L — ABNORMAL LOW (ref 15–41)
AST: 15 U/L (ref 15–41)
Albumin: 2.3 g/dL — ABNORMAL LOW (ref 3.5–5.0)
Albumin: 1.8 g/dL — ABNORMAL LOW (ref 3.5–5.0)
Alkaline Phosphatase: 45 U/L (ref 38–126)
Alkaline Phosphatase: 33 U/L — ABNORMAL LOW (ref 38–126)
Anion gap: 14 (ref 5–15)
Anion gap: 6 (ref 5–15)
BUN: 62 mg/dL — ABNORMAL HIGH (ref 8–23)
BUN: 48 mg/dL — ABNORMAL HIGH (ref 8–23)
CO2: 15 mmol/L — ABNORMAL LOW (ref 22–32)
CO2: 21 mmol/L — ABNORMAL LOW (ref 22–32)
Calcium: 6.7 mg/dL — ABNORMAL LOW (ref 8.9–10.3)
Calcium: 6.1 mg/dL — CL (ref 8.9–10.3)
Chloride: 116 mmol/L — ABNORMAL HIGH (ref 98–111)
Chloride: 118 mmol/L — ABNORMAL HIGH (ref 98–111)
Creatinine, Ser: 3.75 mg/dL — ABNORMAL HIGH (ref 0.61–1.24)
Creatinine, Ser: 3.15 mg/dL — ABNORMAL HIGH (ref 0.61–1.24)
GFR, Estimated: 15 mL/min — ABNORMAL LOW (ref >60)
GFR, Estimated: 19 mL/min — ABNORMAL LOW (ref >60)
Glucose, Bld: 127 mg/dL — ABNORMAL HIGH (ref 70–99)
Glucose, Bld: 191 mg/dL — ABNORMAL HIGH (ref 70–99)
Potassium: 3.9 mmol/L (ref 3.5–5.1)
Potassium: 6 mmol/L — ABNORMAL HIGH (ref 3.5–5.1)
Sodium: 145 mmol/L (ref 135–145)
Sodium: 145 mmol/L (ref 135–145)
Total Bilirubin: 1.1 mg/dL (ref <1.2)
Total Bilirubin: 0.9 mg/dL (ref <1.2)
Total Protein: 3.7 g/dL — ABNORMAL LOW (ref 6.5–8.1)
Total Protein: 3 g/dL — ABNORMAL LOW (ref 6.5–8.1)

## 2022-12-13 LAB — LACTIC ACID, PLASMA
Lactic Acid, Venous: 5.3 mmol/L (ref 0.5–1.9)
Lactic Acid, Venous: 1.8 mmol/L (ref 0.5–1.9)

## 2022-12-13 LAB — BLOOD GAS, VENOUS
Acid-base deficit: 13.3 mmol/L — ABNORMAL HIGH (ref 0.0–2.0)
Acid-base deficit: 8.5 mmol/L — ABNORMAL HIGH (ref 0.0–2.0)
Acid-base deficit: 17.6 mmol/L — ABNORMAL HIGH (ref 0.0–2.0)
Bicarbonate: 11.9 mmol/L — ABNORMAL LOW (ref 20.0–28.0)
Bicarbonate: 16.5 mmol/L — ABNORMAL LOW (ref 20.0–28.0)
Bicarbonate: 14.2 mmol/L — ABNORMAL LOW (ref 20.0–28.0)
O2 Saturation: 82.2 %
O2 Saturation: 54 %
O2 Saturation: 52.3 %
Patient temperature: 37
Patient temperature: 37
Patient temperature: 37
pCO2, Ven: 26 mm[Hg] — ABNORMAL LOW (ref 44–60)
pCO2, Ven: 32 mm[Hg] — ABNORMAL LOW (ref 44–60)
pCO2, Ven: 59 mm[Hg] (ref 44–60)
pH, Ven: 7.27 (ref 7.25–7.43)
pH, Ven: 7.32 (ref 7.25–7.43)
pH, Ven: 6.99 — CL (ref 7.25–7.43)
pO2, Ven: 46 mm[Hg] — ABNORMAL HIGH (ref 32–45)
pO2, Ven: 31 mm[Hg] — CL (ref 32–45)
pO2, Ven: 36 mm[Hg] (ref 32–45)

## 2022-12-13 LAB — MAGNESIUM: Magnesium: 1.8 mg/dL (ref 1.7–2.4)

## 2022-12-13 LAB — BASIC METABOLIC PANEL
Anion gap: 10 (ref 5–15)
BUN: 57 mg/dL — ABNORMAL HIGH (ref 8–23)
CO2: 24 mmol/L (ref 22–32)
Calcium: 6.6 mg/dL — ABNORMAL LOW (ref 8.9–10.3)
Chloride: 113 mmol/L — ABNORMAL HIGH (ref 98–111)
Creatinine, Ser: 3.67 mg/dL — ABNORMAL HIGH (ref 0.61–1.24)
GFR, Estimated: 16 mL/min — ABNORMAL LOW (ref >60)
Glucose, Bld: 146 mg/dL — ABNORMAL HIGH (ref 70–99)
Potassium: 3.5 mmol/L (ref 3.5–5.1)
Sodium: 147 mmol/L — ABNORMAL HIGH (ref 135–145)

## 2022-12-13 LAB — PROCALCITONIN: Procalcitonin: 0.96 ng/mL

## 2022-12-13 LAB — GLUCOSE, CAPILLARY
Glucose-Capillary: 101 mg/dL — ABNORMAL HIGH (ref 70–99)
Glucose-Capillary: 105 mg/dL — ABNORMAL HIGH (ref 70–99)
Glucose-Capillary: 107 mg/dL — ABNORMAL HIGH (ref 70–99)
Glucose-Capillary: 108 mg/dL — ABNORMAL HIGH (ref 70–99)
Glucose-Capillary: 133 mg/dL — ABNORMAL HIGH (ref 70–99)

## 2022-12-13 LAB — PREPARE RBC (CROSSMATCH)

## 2022-12-13 LAB — SURGICAL PATHOLOGY

## 2022-12-13 SURGERY — COLONOSCOPY
Anesthesia: General

## 2022-12-13 MED ORDER — SODIUM ZIRCONIUM CYCLOSILICATE 5 G PO PACK
10.0000 g | PACK | Freq: Once | ORAL | Status: DC
  Filled 2022-12-13: qty 2

## 2022-12-13 MED ORDER — SODIUM BICARBONATE 8.4 % IV SOLN
100.0000 meq | Freq: Once | INTRAVENOUS | Status: AC
  Administered 2022-12-13: 100 meq via INTRAVENOUS
  Filled 2022-12-13: qty 50

## 2022-12-13 MED ORDER — SODIUM CHLORIDE 0.9% IV SOLUTION: Freq: Once | INTRAVENOUS | Status: AC

## 2022-12-13 MED ORDER — LACTATED RINGERS IV BOLUS: 1000.0000 mL | Freq: Once | INTRAVENOUS | Status: DC

## 2022-12-13 MED ORDER — MIDAZOLAM HCL 5 MG/5ML IJ SOLN
INTRAMUSCULAR | Status: DC | PRN
  Administered 2022-12-13: 1 mg via INTRAVENOUS

## 2022-12-13 MED ORDER — MIDAZOLAM HCL 2 MG/2ML IJ SOLN
INTRAMUSCULAR | Status: AC
  Filled 2022-12-13: qty 2

## 2022-12-13 MED ORDER — SODIUM BICARBONATE 8.4 % IV SOLN
50.0000 meq | Freq: Once | INTRAVENOUS | Status: AC
  Administered 2022-12-13: 50 meq via INTRAVENOUS
  Filled 2022-12-13: qty 50

## 2022-12-13 MED ORDER — CALCIUM GLUCONATE-NACL 1-0.675 GM/50ML-% IV SOLN
1.0000 g | Freq: Once | INTRAVENOUS | Status: AC
  Administered 2022-12-13: 1000 mg via INTRAVENOUS
  Filled 2022-12-13: qty 50

## 2022-12-13 MED ORDER — STERILE WATER FOR INJECTION IV SOLN
INTRAVENOUS | Status: DC
  Filled 2022-12-13 (×2): qty 150
  Filled 2022-12-13: qty 1000

## 2022-12-13 MED ORDER — SODIUM BICARBONATE 8.4 % IV SOLN
150.0000 meq | Freq: Once | INTRAVENOUS | Status: AC
  Administered 2022-12-13: 150 meq via INTRAVENOUS

## 2022-12-13 MED ORDER — SODIUM CHLORIDE 0.9% FLUSH: 10.0000 mL | INTRAVENOUS | Status: DC | PRN

## 2022-12-13 MED ORDER — SODIUM CHLORIDE 0.9 % IV SOLN
1.0000 g | INTRAVENOUS | Status: DC
  Filled 2022-12-13: qty 10

## 2022-12-13 MED ORDER — DEXTROSE 5 % IV SOLN
500.0000 mg | INTRAVENOUS | Status: DC
  Administered 2022-12-13: 500 mg via INTRAVENOUS
  Filled 2022-12-13 (×2): qty 5

## 2022-12-13 MED ORDER — NOREPINEPHRINE 16 MG/250ML-% IV SOLN
0.0000 ug/min | INTRAVENOUS | Status: DC
  Administered 2022-12-13: 10 ug/min via INTRAVENOUS
  Filled 2022-12-13 (×2): qty 250

## 2022-12-13 MED ORDER — PROPOFOL 500 MG/50ML IV EMUL
INTRAVENOUS | Status: DC | PRN
  Administered 2022-12-13: 75 ug/kg/min via INTRAVENOUS

## 2022-12-13 MED ORDER — SODIUM CHLORIDE 0.9% FLUSH
10.0000 mL | Freq: Two times a day (BID) | INTRAVENOUS | Status: DC
  Administered 2022-12-14 (×2): 10 mL
  Administered 2022-12-15: 30 mL
  Administered 2022-12-15: 10 mL
  Administered 2022-12-16: 20 mL
  Administered 2022-12-16 – 2022-12-20 (×8): 10 mL

## 2022-12-13 MED ORDER — LACTATED RINGERS IV BOLUS
1000.0000 mL | Freq: Once | INTRAVENOUS | Status: AC
  Administered 2022-12-13: 1000 mL via INTRAVENOUS

## 2022-12-13 MED ORDER — PHENYLEPHRINE HCL (PRESSORS) 10 MG/ML IV SOLN
INTRAVENOUS | Status: DC | PRN
  Administered 2022-12-13: 160 ug via INTRAVENOUS
  Administered 2022-12-13: 80 ug via INTRAVENOUS

## 2022-12-13 MED ORDER — MIDODRINE HCL 5 MG PO TABS: 10.0000 mg | ORAL_TABLET | Freq: Three times a day (TID) | ORAL | Status: DC

## 2022-12-13 MED ORDER — DEXTROSE 50 % IV SOLN
1.0000 | Freq: Once | INTRAVENOUS | Status: DC
  Filled 2022-12-13: qty 50

## 2022-12-13 MED ORDER — PANTOPRAZOLE SODIUM 40 MG IV SOLR
40.0000 mg | Freq: Two times a day (BID) | INTRAVENOUS | Status: DC
  Administered 2022-12-13 – 2022-12-15 (×5): 40 mg via INTRAVENOUS
  Filled 2022-12-13 (×4): qty 10

## 2022-12-13 MED ORDER — SODIUM BICARBONATE 8.4 % IV SOLN
INTRAVENOUS | Status: AC
  Filled 2022-12-13: qty 150

## 2022-12-13 MED ORDER — LACTATED RINGERS IV BOLUS
1000.0000 mL | Freq: Once | INTRAVENOUS | Status: AC
  Administered 2022-12-13 (×2): 1000 mL via INTRAVENOUS

## 2022-12-13 MED ORDER — METOPROLOL SUCCINATE ER 50 MG PO TB24: 100.0000 mg | ORAL_TABLET | Freq: Every day | ORAL | Status: DC

## 2022-12-13 MED ORDER — INSULIN ASPART 100 UNIT/ML IJ SOLN
10.0000 [IU] | Freq: Once | INTRAMUSCULAR | Status: DC
  Filled 2022-12-13 (×2): qty 0.1

## 2022-12-13 MED ORDER — LIDOCAINE 2% (20 MG/ML) 5 ML SYRINGE
INTRAMUSCULAR | Status: DC | PRN
  Administered 2022-12-13: 20 mg via INTRAVENOUS

## 2022-12-13 MED ORDER — PROPOFOL 10 MG/ML IV BOLUS
INTRAVENOUS | Status: DC | PRN
  Administered 2022-12-13: 20 mg via INTRAVENOUS
  Administered 2022-12-13: 40 mg via INTRAVENOUS

## 2022-12-13 MED ORDER — PIPERACILLIN-TAZOBACTAM IN DEX 2-0.25 GM/50ML IV SOLN
2.2500 g | Freq: Three times a day (TID) | INTRAVENOUS | Status: AC
  Administered 2022-12-13 – 2022-12-16 (×9): 2.25 g via INTRAVENOUS
  Filled 2022-12-13 (×10): qty 50

## 2022-12-13 MED ORDER — SODIUM BICARBONATE 8.4 % IV SOLN
100.0000 meq | Freq: Once | INTRAVENOUS | Status: AC
  Administered 2022-12-13: 100 meq via INTRAVENOUS
  Filled 2022-12-13: qty 100

## 2022-12-13 NOTE — Progress Notes (Signed)
       CROSS COVER NOTE  NAME: Kristina Schriver Monte Sr. MRN: [id number] DOB : [birth year]    Concern as stated by nurse / staff   Called on cell phone regarding drop in pressure with restart of levophed and last hgb of 7.1 at 1703 this evening     Pertinent findings on chart review: Unit of blood to be transfused ordered previously 1849 pm by attending Underwent repair of cecal tear with GI today Received 4 liters LR throughout day. Chest xray at 1851 reports patchy opacity throughout left lung concerning for pneumonia. Cardiomegaly unchanged   Assessment and  Interventions   Assessment:    12/13/2022    7:47 PM 12/13/2022    7:45 PM 12/13/2022    7:40 PM  Vitals with BMI  Systolic 94 94 88  Diastolic 43 43 44  Pulse 112 108 110   Pale weak No resp distress Levo restarted Plan: Transfuse unit of blood previously ordered Vbg - 6.99 with bicarb of 14,2 3 amps bicarb CCM consulted - case discussed after line placement vasopressor need more than 10 - ICU assumed care       Donnie Mesa NP [facility] Cross Cover 7pm-7am - check amion for availability Pager [phone]

## 2022-12-13 NOTE — Anesthesia Postprocedure Evaluation (Signed)
Anesthesia Post Note  Patient: Terry Boeding Jernberg Sr.  Procedure(s) Performed: COLONOSCOPY  Patient location during evaluation: ICU Anesthesia Type: General Level of consciousness: awake and awake and alert Pain management: satisfactory to patient Vital Signs Assessment: post-procedure vital signs reviewed and stable Respiratory status: spontaneous breathing Cardiovascular status: stable Anesthetic complications: no   No notable events documented.   Last Vitals:  Vitals:   12/13/22 1230 12/13/22 1246  BP: (!) 94/35 (!) 113/47  Pulse: (!) 113 (!) 113  Resp: 15 16  Temp:    SpO2:  95%    Last Pain:  Vitals:   12/13/22 1223  TempSrc: Temporal  PainSc: Asleep                 VAN STAVEREN,Anibal Quinby

## 2022-12-13 NOTE — Progress Notes (Signed)
Pharmacy Antibiotic Note  Terry DECOU Sr. is a 82 y.o. male admitted on 12/12/2022 with sepsis.  Pharmacy has been consulted for Zosyn dosing.  -GI bleeding, CKD5  Plan: Zosyn 2,25 gm IV q8h  for Crcl 17.8 ml/min     F/u renal fxn, cultures, and length of therapy  Height: 5\' 9"  (175.3 cm) Weight: 76.2 kg (168 lb) IBW/kg (Calculated) : 70.7  Temp (24hrs), Avg:97.7 F (36.5 C), Min:96.9 F (36.1 C), Max:98.1 F (36.7 C)  Recent Labs  Lab 12/11/22 2140 12/12/22 0418 12/13/22 0537 12/13/22 1703 12/13/22 1942 12/13/22 1949  WBC 4.7  --  7.8 6.0  --  7.6  CREATININE 4.30* 4.26* 3.75*  --   --  3.15*  LATICACIDVEN  --   --   --   --  5.3*  --     Estimated Creatinine Clearance: 17.8 mL/min (A) (by C-G formula based on SCr of 3.15 mg/dL (H)).    Allergies  Allergen Reactions   Phenothiazines Other (See Comments)    Extra-pyramidal reaction.   Extra-pyramidal reaction   Prochlorperazine Edisylate     Antimicrobials this admission: zosyn 12/6 >>    Azithromycin 12/6   >>    Dose adjustments this admission:    Microbiology results: 12/6 BCx: pending   UCx:    12/6 Sputum: pending  12/6 MRSA PCR: positive 12/6 CXR: Patchy opacity throughout the left lung concerning for pneumonia   Thank you for allowing pharmacy to be a part of this patient's care.  Tate Jerkins A 12/13/2022 9:08 PM

## 2022-12-13 NOTE — Consult Note (Signed)
NAME:  Terry MOHAMAD Sr., MRN:  [id number], DOB:  [birth year], LOS: 1 ADMISSION DATE:  12/12/2022, CONSULTATION DATE:  12/13/2022 REFERRING MD:  Manuela Schwartz, NP, CHIEF COMPLAINT:  Hypotension   Brief Pt Description / Synopsis:  82 y.o. 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  Course complicated by shock and concern for developing pneumonia.  History of Present Illness:  ***   ED Course: Initial Vital Signs: Significant Labs: Imaging Chest X-ray>> Medications Administered:   Pertinent  Medical History   Past Medical History:  Diagnosis Date   A-fib (HCC)    Allergic rhinitis    CAD (coronary artery disease)    Chronic kidney disease    Hypertension      Micro Data:  12/5: MRSA PCR>>negative 12/6: Blood culture x2>> 12/6: Sputum>> 12/6: Strep pneumo & Legionella urinary antigens>>  Antimicrobials:   Anti-infectives (From admission, onward)    Start     Dose/Rate Route Frequency Ordered Stop   12/13/22 2200  piperacillin-tazobactam (ZOSYN) IVPB 2.25 g        2.25 g 100 mL/hr over 30 Minutes Intravenous Every 8 hours 12/13/22 2107     12/13/22 2100  azithromycin (ZITHROMAX) 500 mg in dextrose 5 % 250 mL IVPB        500 mg 255 mL/hr over 60 Minutes Intravenous Every 24 hours 12/13/22 1955 12/18/22 2059   12/13/22 2030  cefTRIAXone (ROCEPHIN) 1 g in sodium chloride 0.9 % 100 mL IVPB  Status:  Discontinued        1 g 200 mL/hr over 30 Minutes Intravenous Every 24 hours 12/13/22 1955 12/13/22 2036        Significant Hospital Events: Including procedures, antibiotic start and stop dates in addition to other pertinent events   12/4: Underwent elective EGD and Colostomy  for evaluation of IDA, with polypectomy  Interim History / Subjective:  ***  Objective   Blood pressure (!) 94/43, pulse (!) 112, temperature 97.7 F (36.5 C), temperature source Oral, resp. rate (!) 21, height 5\' 9"  (1.753 m), weight 76.2 kg, SpO2 100%.        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, SVT, CAD s/p CABG, Aortic insufficiency s/p AVR (bioprosthetic) in 2012, HLD Echocardiogram 02/2022: LVEF 40%, enlarged RV size and reduced RV function, normal function bioprosthetic aortic valve, mild MR -Continuous cardiac monitoring -Maintain MAP >65 -IV fluids -Transfusions as indicated -Vasopressors as needed to maintain MAP goal -Trend lactic acid until normalized -Trend HS Troponin until peaked -Repeat Echocardiogram pending -Hold home antihypertensives -Holding Eliquis due to GI bleed -Will consult Cardiology in the morning 12/7  #Acute Blood Loss Anemia #Hematochezia due to acute Lower GI Bleed, post-procedural bleeding following Colonoscopy with Polypectomy on  12/11/22 -GI following, appreciate input -Monitor for S/Sx of bleeding -Trend CBC (H&H q6h) -SCD's for VTE Prophylaxis (chemical ppx contraindicated) -Transfuse for Hgb <7 -Continue Protonix 40 mg BID  #AKI on CKD Stage V (not yet on HD, fistula has been placed) #Hyperkalemia #Metabolic Acidosis -Monitor I&O's / urinary output -Follow BMP -Ensure adequate renal perfusion -Avoid nephrotoxic agents as  able -Replace electrolytes as indicated ~ Pharmacy following for assistance with electrolyte replacement -Will give temporizing measures: 1g Ca gluconate, insulin + D50, received 3 amps bicarb, insulin + D50,     Best Practice (right click and "Reselect all SmartList Selections" daily)   Diet/type: NPO DVT prophylaxis: SCD GI prophylaxis: PPI Lines: Central line Foley:  Yes, and it is still needed Code Status:  full code Last date of multidisciplinary goals of care discussion [N/A]  12/6: Pt updated at bedside, pt's wife updated via telephone by [facility] NP.  Labs   CBC: Recent Labs  Lab 12/11/22 2140 12/12/22 0418 12/12/22 1728 12/13/22 0537 12/13/22 1703  WBC 4.7  --   --  7.8 6.0  NEUTROABS 2.8  --   --   --   --   HGB 10.6* 8.0* 8.3* 8.4* 7.1*  HCT 34.7*  --   --  24.9* 21.1*  MCV 99.1  --   --  91.2 88.3  PLT 154  --   --  114* 93*    Basic Metabolic Panel: Recent Labs  Lab 12/11/22 2140 12/12/22 0418 12/13/22 0537  NA 139 141 145  K 3.5 3.8 3.9  CL 108 111 116*  CO2 16* 19* 15*  GLUCOSE 129* 103* 127*  BUN 77* 77* 62*  CREATININE 4.30* 4.26* 3.75*  CALCIUM 8.1* 7.7* 6.7*  MG  --   --  1.8   GFR: Estimated Creatinine Clearance: 14.9 mL/min (A) (by C-G formula based on SCr of 3.75 mg/dL (H)). Recent Labs  Lab 12/11/22 2140 12/13/22 0537 12/13/22 1703  WBC 4.7 7.8 6.0    Liver Function Tests: Recent Labs  Lab 12/13/22 0537  AST 12*  ALT 11  ALKPHOS 45  BILITOT 1.1  PROT 3.7*  ALBUMIN 2.3*   No results for input(s): "LIPASE", "AMYLASE" in the last 168 hours. No results for input(s): "AMMONIA" in the last 168 hours.  ABG    Component Value Date/Time   HCO3 16.5 (L) 12/13/2022 0537   ACIDBASEDEF 8.5 (H) 12/13/2022 0537   O2SAT 54 12/13/2022 0537     Coagulation Profile: Recent Labs  Lab 12/12/22 0142  INR 1.3*    Cardiac Enzymes: No results for input(s): "CKTOTAL", "CKMB", "CKMBINDEX", "TROPONINI" in the last 168  hours.  HbA1C: No results found for: "HGBA1C"  CBG: Recent Labs  Lab 12/12/22 1725 12/13/22 0040 12/13/22 0332 12/13/22 1614 12/13/22 1931  GLUCAP 87 107* 108* 105* 101*    Review of Systems:   Positives in BOLD:   Gen: Denies fever, chills, weight change, fatigue, night sweats HEENT: Denies blurred vision, double vision, hearing loss, tinnitus, sinus congestion, rhinorrhea, sore throat, neck stiffness, dysphagia PULM: Denies shortness of breath, cough, sputum production, hemoptysis, wheezing CV: Denies chest pain, edema, orthopnea, paroxysmal nocturnal dyspnea, palpitations GI: Denies abdominal pain, nausea, vomiting, diarrhea, hematochezia, melena, constipation, change in bowel habits GU: Denies dysuria, hematuria, polyuria, oliguria, urethral discharge Endocrine: Denies hot or cold intolerance, polyuria, polyphagia or appetite change Derm: Denies rash, dry skin, scaling or peeling skin change Heme: Denies easy bruising, bleeding, bleeding gums Neuro: Denies headache, numbness, weakness, slurred  speech, loss of memory or consciousness   Past Medical History:  He,  has a past medical history of A-fib (HCC), Allergic rhinitis, CAD (coronary artery disease), Chronic kidney disease, and Hypertension.   Surgical History:   Past Surgical History:  Procedure Laterality Date   AORTIC VALVE REPAIR     AV FISTULA PLACEMENT Left 02/2022   BIOPSY  12/11/2022   Procedure: BIOPSY;  Surgeon: Norma Fredrickson, Boykin Nearing, MD;  Location: [facility] ENDOSCOPY;  Service: Gastroenterology;;   COLONOSCOPY N/A 12/13/2022   Procedure: COLONOSCOPY;  Surgeon: Toledo, Boykin Nearing, MD;  Location: [facility] ENDOSCOPY;  Service: Gastroenterology;  Laterality: N/A;   COLONOSCOPY WITH PROPOFOL N/A 12/11/2022   Procedure: COLONOSCOPY WITH PROPOFOL;  Surgeon: Toledo, Boykin Nearing, MD;  Location: [facility] ENDOSCOPY;  Service: Gastroenterology;  Laterality: N/A;   CORONARY ARTERY BYPASS GRAFT     ESOPHAGOGASTRODUODENOSCOPY (EGD) WITH  PROPOFOL N/A 12/11/2022   Procedure: ESOPHAGOGASTRODUODENOSCOPY (EGD) WITH PROPOFOL;  Surgeon: Toledo, Boykin Nearing, MD;  Location: [facility] ENDOSCOPY;  Service: Gastroenterology;  Laterality: N/A;   HEMOSTASIS CLIP PLACEMENT  12/11/2022   Procedure: HEMOSTASIS CLIP PLACEMENT;  Surgeon: Norma Fredrickson, Boykin Nearing, MD;  Location: [facility] ENDOSCOPY;  Service: Gastroenterology;;   POLYPECTOMY  12/11/2022   Procedure: POLYPECTOMY;  Surgeon: Toledo, Boykin Nearing, MD;  Location: [facility] ENDOSCOPY;  Service: Gastroenterology;;     Social History:   reports that he has quit smoking. He has never used smokeless tobacco. He reports that he does not drink alcohol and does not use drugs.   Family History:  His family history includes Aneurysm in his mother; Heart attack in his brother, father, and sister.   Allergies Allergies  Allergen Reactions   Phenothiazines Other (See Comments)    Extra-pyramidal reaction.   Extra-pyramidal reaction   Prochlorperazine Edisylate      Home Medications  Prior to Admission medications   Medication Sig Start Date End Date Taking? Authorizing Provider  acetaminophen (TYLENOL) 500 MG tablet Take 1,000 mg by mouth every 6 (six) hours as needed for mild pain (pain score 1-3).   Yes [provider]  amoxicillin (AMOXIL) 500 MG tablet Take 2,000 mg by mouth once. 12/03/22  Yes [provider]  atorvastatin (LIPITOR) 20 MG tablet Take 1 tablet (20 mg total) by mouth daily. 05/29/22  Yes Antonieta Iba, MD  Cholecalciferol (VITAMIN D3) 2000 units capsule Take 2,000 Units by mouth daily.   Yes [provider]  diclofenac Sodium (VOLTAREN) 1 % GEL Apply topically 4 (four) times daily as needed.   Yes [provider]  ELIQUIS 2.5 MG TABS tablet Take 1 tablet (2.5 mg total) by mouth 2 (two) times daily. 05/29/22  Yes Antonieta Iba, MD  esomeprazole (NEXIUM) 40 MG capsule Take 40 mg by mouth as needed.   Yes [provider]  fluticasone (FLONASE) 50  MCG/ACT nasal spray Place 2 sprays into both nostrils daily. 04/25/09  Yes [provider]  furosemide (LASIX) 40 MG tablet Take 1 tablet (40 mg total) by mouth 2 (two) times daily. 05/29/22  Yes Antonieta Iba, MD  losartan (COZAAR) 50 MG tablet Take 1 tablet (50 mg total) by mouth daily. 05/29/22  Yes Antonieta Iba, MD  metoprolol succinate (TOPROL-XL) 100 MG 24 hr tablet Take 1 tablet (100 mg total) by mouth 2 (two) times daily. Take with or immediately following a meal. Patient taking differently: Take 150 mg by mouth 2 (two) times daily. Take with or immediately following a meal. 05/29/22  Yes Mariah Milling, Marcial Pacas  J, MD  psyllium (METAMUCIL) 0.52 g capsule Take 0.52 g by mouth daily.   Yes [provider]  Saw Palmetto 450 MG CAPS Take 450 mg by mouth daily.   Yes [provider]  sodium bicarbonate 650 MG tablet Take 650 mg by mouth 2 (two) times daily.   Yes [provider]  tacrolimus (PROTOPIC) 0.1 % ointment Apply topically. 08/17/12  Yes [provider]  tamsulosin (FLOMAX) 0.4 MG CAPS capsule Take 0.4 mg by mouth daily after supper.   Yes [provider]     Critical care time: 60 minutes     Harlon Ditty, AGACNP-BC [facility] Prefer epic messenger for cross cover needs If after hours, please call E-link

## 2022-12-13 NOTE — Procedures (Signed)
Central Venous Catheter Insertion Procedure Note  Shen Reitmeier  [id number]  02/02/1940  Date:12/13/22  Time:8:31 PM   Provider Performing:Kristiann Noyce D Elvina Sidle   Procedure: Insertion of Non-tunneled Central Venous [phone]) with US guidance ([id number])   Indication(s) Medication administration and Difficult access  Consent Risks of the procedure as well as the alternatives and risks of each were explained to the patient and/or caregiver.  Consent for the procedure was obtained and is signed in the bedside chart  Anesthesia Topical only with 1% lidocaine   Timeout Verified patient identification, verified procedure, site/side was marked, verified correct patient position, special equipment/implants available, medications/allergies/relevant history reviewed, required imaging and test results available.  Sterile Technique Maximal sterile technique including full sterile barrier drape, hand hygiene, sterile gown, sterile gloves, mask, hair covering, sterile ultrasound probe cover (if used).  Procedure Description Area of catheter insertion was cleaned with chlorhexidine and draped in sterile fashion.  With real-time ultrasound guidance a central venous catheter was placed into the right femoral vein. Nonpulsatile blood flow and easy flushing noted in all ports.  The catheter was sutured in place and sterile dressing applied.  Complications/Tolerance None; patient tolerated the procedure well. Chest X-ray is ordered to verify placement for internal jugular or subclavian cannulation.   Chest x-ray is not ordered for femoral cannulation.  EBL Minimal  Specimen(s) None   Line inserted to the 20 cm mark. BIOPATCH applied to the insertion site.   Harlon Ditty, AGACNP-BC [facility] Prefer epic messenger for cross cover needs If after hours, please call E-link

## 2022-12-13 NOTE — Interval H&P Note (Signed)
History and Physical Interval Note:  12/13/2022 11:34 AM  Terry Ano Laidler Sr.  has presented today for surgery, with the diagnosis of Hematochezia, GI bleed, Post-polypectomy bleed.  The various methods of treatment have been discussed with the patient and family. After consideration of risks, benefits and other options for treatment, the patient has consented to  Procedure(s): COLONOSCOPY (N/A) as a surgical intervention.  The patient's history has been reviewed, patient examined, no change in status, stable for surgery.  I have reviewed the patient's chart and labs.  Questions were answered to the patient's satisfaction.     [place], [place]

## 2022-12-13 NOTE — TOC Progression Note (Signed)
Transition of Care (TOC) - Progression Note    Patient Details  Name: Terry GREALISH Sr. MRN: [id number] Date of Birth: 29-Jan-1940  Transition of Care [facility]) CM/SW Contact  Truddie Hidden, RN Phone Number: 12/13/2022, 1:58 PM  Clinical Narrative:    TOC continuing to follow patient's progress throughout discharge planning.        Expected Discharge Plan and Services                                               Social Determinants of Health (SDOH) Interventions SDOH Screenings   Food Insecurity: No Food Insecurity (12/12/2022)  Housing: Low Risk  (12/12/2022)  Transportation Needs: No Transportation Needs (12/12/2022)  Utilities: Not At Risk (12/12/2022)  Depression (PHQ2-9): Low Risk  (07/09/2022)  Tobacco Use: Medium Risk (12/13/2022)    Readmission Risk Interventions     No data to display

## 2022-12-13 NOTE — Consult Note (Incomplete)
NAME:  Terry WOLTER Sr., MRN:  [id number], DOB:  January 10, 1940, LOS: 1 ADMISSION DATE:  12/12/2022, CONSULTATION DATE:  12/13/2022 REFERRING MD:  Manuela Schwartz, NP, CHIEF COMPLAINT:  Hypotension   Brief Pt Description / Synopsis:  82 y.o. 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  Course complicated by shock and concern for developing pneumonia.  History of Present Illness:  Terry WHITON Sr. is a 82 y.o. male with medical history significant for CKD5, HTN, A-fib on Eliquis (last dose 12/2), CAD s/p CABG, ascending aortic dissection s/p repair 1974, bioprosthetic aortic valve, SVT on high-dose metoprolol succinate, history of syncope with prior loop recorder who presented to [facility] ED on 12/11/22 due to complaints of rectal bleeding that started several hours following Colonoscopy and polypectomy.  He underwent elective EGD and Colonoscopy on 12/11/22 for workup of iron deficiency anemia, which required snare polypectomy of 15 polyps with placement of multiple hemostatic clips.  He reported that several hours after the procedure he felt like he was oozing blood and began passing dark clots. He endorsed mild lightheadedness but denies chest pain, shortness of breath or palpitations,  but denied abdominal pain.  Of note, patient stopped taking Eliquis since 12/20 preparation for his colonoscopy and has not yet resumed.  He was however taking a baby aspirin as advised by his cardiologist who did preprocedural cardiac clearance.   ED Course: Initial Vital Signs: Temperature 97.5 F, RR 18, BP 160/76 and pulse 67, SpO2 100% Significant Labs:  hemoglobin 10.6.  Baseline of 9. Renal function at baseline with creatinine  4.3, bicarb 16  Hospitalists were asked to admit for further workup and treatment.  GI was consulted.  Please see "Significant Hospital Events" section below for full detailed hospital course.   Pertinent  Medical History   Past Medical History:  Diagnosis Date  . A-fib (HCC)   . Allergic rhinitis   . CAD (coronary artery disease)   . Chronic kidney disease   . Hypertension      Micro Data:  12/5: MRSA PCR>>negative 12/6: Blood culture x2>> 12/6: Sputum>> 12/6: Strep pneumo & Legionella urinary antigens>>  Antimicrobials:   Anti-infectives (From admission, onward)    Start     Dose/Rate Route Frequency Ordered Stop   12/13/22 2200  piperacillin-tazobactam (ZOSYN) IVPB 2.25 g        2.25 g 100 mL/hr over 30 Minutes Intravenous Every 8 hours 12/13/22 2107     12/13/22 2100  azithromycin (ZITHROMAX) 500 mg in dextrose 5 % 250 mL IVPB        500 mg 255 mL/hr over 60 Minutes Intravenous Every 24 hours 12/13/22 1955 12/18/22 2059   12/13/22 2030  cefTRIAXone (ROCEPHIN) 1 g in sodium chloride 0.9 % 100 mL IVPB  Status:  Discontinued        1 g 200 mL/hr over 30 Minutes Intravenous Every 24 hours 12/13/22 1955 12/13/22 2036        Significant Hospital Events: Including procedures, antibiotic start and stop dates in addition to other pertinent events   12/4: Underwent elective EGD and Colostomy for evaluation of IDA, with polypectomy.  Went home post procedure, however in the following hours at home, passing bright red blood and clots  from rectum   Interim History / Subjective:  ***  Objective   Blood pressure (!) 94/43, pulse (!) 112, temperature 97.7 F (36.5 C), temperature source Oral, resp. rate (!) 21, height 5\' 9"  (1.753 m), weight 76.2 kg, SpO2 100%.        Intake/Output Summary (Last 24 hours) at 12/13/2022 1949 Last data filed at 12/13/2022 1800 Gross per 24 hour  Intake 6084.01 ml  Output 200 ml  Net 5884.01 ml   Filed Weights   12/11/22 2135  Weight:  76.2 kg    Examination: General: *** HENT: *** Lungs: *** Cardiovascular: *** Abdomen: *** Extremities: *** Neuro: *** GU: ***  Resolved Hospital Problem list     Assessment & Plan:   #Multifactorial Shock: Hypovolemic/Hemorrhagic +/- Septic #Atrial Fibrillation w/ RVR #Chronic HFrEF PMHx: HTN, paroxsymal A.fib/A. flutter on Eliquis, SVT, CAD s/p CABG, Aortic insufficiency s/p AVR (bioprosthetic) in 2012, HLD Echocardiogram 02/2022: LVEF 40%, enlarged RV size and reduced RV function, normal function bioprosthetic aortic valve, mild MR -Continuous cardiac monitoring -Maintain MAP >65 -IV fluids -Transfusions as indicated -Vasopressors as needed to maintain MAP goal -Trend lactic acid until normalized -Trend HS Troponin until peaked -Repeat Echocardiogram pending -Hold home antihypertensives -Holding Eliquis due to GI bleed -Will consult Cardiology in the morning 12/7  #Acute Blood Loss Anemia #Hematochezia due to acute Lower GI Bleed, post-procedural bleeding following Colonoscopy with Polypectomy on 12/11/22 -GI following, appreciate input -Monitor for S/Sx of bleeding -Trend CBC (H&H q6h) -SCD's for VTE Prophylaxis (chemical ppx contraindicated) -Transfuse for Hgb <7 -Continue Protonix 40 mg BID  #AKI on CKD Stage V (not yet on HD, fistula has been placed) #Hyperkalemia #Metabolic Acidosis -Monitor I&O's / urinary output -Follow BMP -Ensure adequate renal perfusion -Avoid nephrotoxic agents as able -Replace electrolytes as indicated ~ Pharmacy following for assistance with electrolyte replacement -Will give temporizing measures: 1g Ca gluconate, insulin + D50, received 3 amps bicarb, insulin + D50,     Best Practice (right click and "Reselect all SmartList Selections" daily)   Diet/type: NPO DVT prophylaxis: SCD GI prophylaxis: PPI Lines: Central line Foley:  Yes, and it is still needed Code Status:  full code Last date of multidisciplinary goals of care  discussion [N/A]  12/6: Pt updated at bedside, pt's wife updated via telephone by [facility] NP.  Labs   CBC: Recent Labs  Lab 12/11/22 2140 12/12/22 0418 12/12/22 1728 12/13/22 0537 12/13/22 1703  WBC 4.7  --   --  7.8 6.0  NEUTROABS 2.8  --   --   --   --   HGB 10.6* 8.0* 8.3* 8.4* 7.1*  HCT 34.7*  --   --  24.9* 21.1*  MCV 99.1  --   --  91.2 88.3  PLT 154  --   --  114* 93*    Basic Metabolic Panel: Recent Labs  Lab 12/11/22 2140 12/12/22 0418 12/13/22 0537  NA 139 141 145  K 3.5 3.8 3.9  CL 108 111 116*  CO2 16* 19* 15*  GLUCOSE 129* 103* 127*  BUN 77* 77* 62*  CREATININE 4.30* 4.26* 3.75*  CALCIUM 8.1* 7.7* 6.7*  MG  --   --  1.8   GFR: Estimated Creatinine Clearance: 14.9 mL/min (A) (by C-G formula based on SCr of 3.75 mg/dL (H)). Recent Labs  Lab 12/11/22 2140 12/13/22 0537 12/13/22 1703  WBC 4.7 7.8 6.0    Liver Function Tests: Recent Labs  Lab 12/13/22 0537  AST 12*  ALT 11  ALKPHOS 45  BILITOT 1.1  PROT 3.7*  ALBUMIN 2.3*   No results for input(s): "LIPASE", "AMYLASE" in the last 168 hours. No results for input(s): "AMMONIA" in the last 168 hours.  ABG    Component Value Date/Time   HCO3 16.5 (L) 12/13/2022 0537   ACIDBASEDEF 8.5 (H) 12/13/2022 0537   O2SAT 54 12/13/2022 0537     Coagulation Profile: Recent Labs  Lab 12/12/22 0142  INR 1.3*    Cardiac Enzymes: No results for input(s): "CKTOTAL", "CKMB", "CKMBINDEX", "TROPONINI" in the last 168 hours.  HbA1C: No results found for: "HGBA1C"  CBG: Recent Labs  Lab 12/12/22 1725 12/13/22 0040 12/13/22 0332 12/13/22 1614 12/13/22 1931  GLUCAP 87 107* 108* 105* 101*    Review of Systems:   Positives in BOLD:   Gen: Denies fever, chills, weight change, fatigue, night sweats HEENT: Denies blurred vision, double vision, hearing loss, tinnitus, sinus congestion, rhinorrhea, sore throat, neck stiffness, dysphagia PULM: Denies shortness of breath, cough, sputum production,  hemoptysis, wheezing CV: Denies chest pain, edema, orthopnea, paroxysmal nocturnal dyspnea, palpitations GI: Denies abdominal pain, nausea, vomiting, diarrhea, hematochezia, melena, constipation, change in bowel habits GU: Denies dysuria, hematuria, polyuria, oliguria, urethral discharge Endocrine: Denies hot or cold intolerance, polyuria, polyphagia or appetite change Derm: Denies rash, dry skin, scaling or peeling skin change Heme: Denies easy bruising, bleeding, bleeding gums Neuro: Denies headache, numbness, weakness, slurred speech, loss of memory or consciousness   Past Medical History:  He,  has a past medical history of A-fib (HCC), Allergic rhinitis, CAD (coronary artery disease), Chronic kidney disease, and Hypertension.   Surgical History:   Past Surgical History:  Procedure Laterality Date  . AORTIC VALVE REPAIR    . AV FISTULA PLACEMENT Left 02/2022  . BIOPSY  12/11/2022   Procedure: BIOPSY;  Surgeon: Norma Fredrickson, Boykin Nearing, MD;  Location: [facility] ENDOSCOPY;  Service: Gastroenterology;;  . COLONOSCOPY N/A 12/13/2022   Procedure: COLONOSCOPY;  Surgeon: Toledo, Boykin Nearing, MD;  Location: [facility] ENDOSCOPY;  Service: Gastroenterology;  Laterality: N/A;  . COLONOSCOPY WITH PROPOFOL N/A 12/11/2022   Procedure: COLONOSCOPY WITH PROPOFOL;  Surgeon: Toledo, Boykin Nearing, MD;  Location: [facility] ENDOSCOPY;  Service: Gastroenterology;  Laterality: N/A;  . CORONARY ARTERY BYPASS GRAFT    . ESOPHAGOGASTRODUODENOSCOPY (EGD) WITH PROPOFOL N/A 12/11/2022   Procedure: ESOPHAGOGASTRODUODENOSCOPY (EGD) WITH PROPOFOL;  Surgeon: Toledo, Boykin Nearing, MD;  Location: [facility] ENDOSCOPY;  Service: Gastroenterology;  Laterality: N/A;  . HEMOSTASIS CLIP PLACEMENT  12/11/2022   Procedure: HEMOSTASIS CLIP PLACEMENT;  Surgeon: Norma Fredrickson, Boykin Nearing, MD;  Location: [facility] ENDOSCOPY;  Service: Gastroenterology;;  . POLYPECTOMY  12/11/2022   Procedure: POLYPECTOMY;  Surgeon: Toledo, Boykin Nearing, MD;  Location: [facility] ENDOSCOPY;  Service:  Gastroenterology;;     Social History:   reports that he has quit smoking. He has never used smokeless tobacco. He reports that he does not drink alcohol and does not use drugs.   Family History:  His family history includes Aneurysm in his mother; Heart attack in his brother, father, and sister.   Allergies Allergies  Allergen Reactions  . Phenothiazines Other (See Comments)    Extra-pyramidal reaction.   Extra-pyramidal reaction  . Prochlorperazine Edisylate      Home Medications  Prior to Admission medications   Medication Sig Start Date End Date Taking? Authorizing Provider  acetaminophen (TYLENOL) 500 MG tablet Take 1,000 mg by mouth every 6 (six) hours as needed for mild pain (pain score 1-3).   Yes [provider]  amoxicillin (AMOXIL)  500 MG tablet Take 2,000 mg by mouth once. 12/03/22  Yes [provider]  atorvastatin (LIPITOR) 20 MG tablet Take 1 tablet (20 mg total) by mouth daily. 05/29/22  Yes Antonieta Iba, MD  Cholecalciferol (VITAMIN D3) 2000 units capsule Take 2,000 Units by mouth daily.   Yes [provider]  diclofenac Sodium (VOLTAREN) 1 % GEL Apply topically 4 (four) times daily as needed.   Yes [provider]  ELIQUIS 2.5 MG TABS tablet Take 1 tablet (2.5 mg total) by mouth 2 (two) times daily. 05/29/22  Yes Antonieta Iba, MD  esomeprazole (NEXIUM) 40 MG capsule Take 40 mg by mouth as needed.   Yes [provider]  fluticasone (FLONASE) 50 MCG/ACT nasal spray Place 2 sprays into both nostrils daily. 04/25/09  Yes [provider]  furosemide (LASIX) 40 MG tablet Take 1 tablet (40 mg total) by mouth 2 (two) times daily. 05/29/22  Yes Antonieta Iba, MD  losartan (COZAAR) 50 MG tablet Take 1 tablet (50 mg total) by mouth daily. 05/29/22  Yes Antonieta Iba, MD  metoprolol succinate (TOPROL-XL) 100 MG 24 hr tablet Take 1 tablet (100 mg total) by mouth 2 (two) times daily. Take with or immediately  following a meal. Patient taking differently: Take 150 mg by mouth 2 (two) times daily. Take with or immediately following a meal. 05/29/22  Yes Gollan, Tollie Pizza, MD  psyllium (METAMUCIL) 0.52 g capsule Take 0.52 g by mouth daily.   Yes [provider]  Saw Palmetto 450 MG CAPS Take 450 mg by mouth daily.   Yes [provider]  sodium bicarbonate 650 MG tablet Take 650 mg by mouth 2 (two) times daily.   Yes [provider]  tacrolimus (PROTOPIC) 0.1 % ointment Apply topically. 08/17/12  Yes [provider]  tamsulosin (FLOMAX) 0.4 MG CAPS capsule Take 0.4 mg by mouth daily after supper.   Yes [provider]     Critical care time: 60 minutes     Harlon Ditty, AGACNP-BC [facility] Prefer epic messenger for cross cover needs If after hours, please call E-link

## 2022-12-13 NOTE — Anesthesia Preprocedure Evaluation (Addendum)
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   Neuro/Psych negative neurological ROS  negative psych ROS   GI/Hepatic negative GI ROS, Neg liver ROS,,,  Endo/Other  negative endocrine ROS    Renal/GU CRFRenal diseasenegative Renal ROS  negative genitourinary   Musculoskeletal negative musculoskeletal ROS (+)    Abdominal Normal abdominal exam  (+)   Peds negative pediatric ROS (+)  Hematology negative hematology ROS (+) Blood dyscrasia, anemia   Anesthesia Other Findings Past Medical History: No date: A-fib (HCC) No date: Allergic rhinitis No date: CAD (coronary artery disease) No date: Chronic kidney disease No date: Hypertension  Past Surgical History: No date: AORTIC VALVE REPAIR 02/2022: AV FISTULA PLACEMENT; Left 12/11/2022: BIOPSY     Comment:  Procedure: BIOPSY;  Surgeon: Norma Fredrickson, Boykin Nearing, MD;                Location: [facility] ENDOSCOPY;  Service: Gastroenterology;; 12/11/2022: COLONOSCOPY WITH PROPOFOL; N/A     Comment:  Procedure: COLONOSCOPY WITH PROPOFOL;  Surgeon: Toledo,               Boykin Nearing, MD;  Location: [facility] ENDOSCOPY;  Service:               Gastroenterology;  Laterality: N/A; No date: CORONARY ARTERY BYPASS GRAFT 12/11/2022: ESOPHAGOGASTRODUODENOSCOPY (EGD) WITH PROPOFOL; N/A     Comment:  Procedure: ESOPHAGOGASTRODUODENOSCOPY (EGD) WITH               PROPOFOL;  Surgeon: Toledo, Boykin Nearing, MD;  Location:                [facility] ENDOSCOPY;  Service: Gastroenterology;  Laterality:               N/A; 12/11/2022: HEMOSTASIS CLIP PLACEMENT     Comment:  Procedure: HEMOSTASIS CLIP PLACEMENT;  Surgeon: Norma Fredrickson,               Boykin Nearing, MD;  Location: [facility] ENDOSCOPY;  Service:               Gastroenterology;; 12/11/2022: POLYPECTOMY     Comment:  Procedure: POLYPECTOMY;  Surgeon: Toledo, Boykin Nearing, MD;              Location: [facility] ENDOSCOPY;  Service: Gastroenterology;;  BMI    Body Mass Index: 24.81 kg/m      Reproductive/Obstetrics negative OB ROS                             Anesthesia Physical Anesthesia Plan  ASA: 4  Anesthesia Plan: General   Post-op Pain Management:    Induction: Intravenous  PONV Risk Score and Plan: Propofol infusion and TIVA  Airway Management Planned: Natural Airway and Nasal Cannula  Additional Equipment:   Intra-op Plan:   Post-operative Plan:   Informed Consent: I have reviewed the patients History and Physical, chart, labs and discussed the procedure including the risks, benefits and alternatives for the proposed  anesthesia with the patient or authorized representative who has indicated his/her understanding and acceptance.     Dental Advisory Given  Plan Discussed with: CRNA and Surgeon  Anesthesia Plan Comments:         Anesthesia Quick Evaluation

## 2022-12-13 NOTE — Transfer of Care (Signed)
Immediate Anesthesia Transfer of Care Note  Patient: Terry Ano Lewellyn Sr.  Procedure(s) Performed: COLONOSCOPY  Patient Location: PACU and Endoscopy Unit  Anesthesia Type:General  Level of Consciousness: awake and oriented  Airway & Oxygen Therapy: Patient Spontanous Breathing and Patient connected to nasal cannula oxygen  Post-op Assessment: Report given to RN and Post -op Vital signs reviewed and stable  Post vital signs: Reviewed  Last Vitals:  Vitals Value Taken Time  BP 118/66 12/13/22 1223  Temp 36.1 C 12/13/22 1223  Pulse 111 12/13/22 1223  Resp 16 12/13/22 1223  SpO2 98 % 12/13/22 1223    Last Pain:  Vitals:   12/13/22 1223  TempSrc: Temporal  PainSc: Asleep         Complications: No notable events documented.

## 2022-12-13 NOTE — Op Note (Signed)
[facility] Gastroenterology Patient Name: Terry Stephens Procedure Date: 12/13/2022 10:48 AM MRN: [id number] Account #: [id number] Date of Birth: 1940-01-18 Admit Type: Inpatient Age: 82 Room: [facility] ENDO ROOM 4 Gender: Male Note Status: Finalized Instrument Name: Prentice Docker [id number] Procedure:             Colonoscopy Indications:           Treatment of bleeding from polypectomy site Providers:             Royce Macadamia K. Norma Fredrickson MD, MD Referring MD:          Jillene Bucks. Arlana Pouch, MD (Referring MD) Medicines:             Propofol per Anesthesia Complications:         No immediate complications. Estimated blood loss:                         Minimal. Procedure:             Pre-Anesthesia Assessment:                        - The risks and benefits of the procedure and the                         sedation options and risks were discussed with the                         patient. All questions were answered and informed                         consent was obtained.                        - Patient identification and proposed procedure were                         verified prior to the procedure by the nurse. The                         procedure was verified in the procedure room.                        - ASA Grade Assessment: IV - A patient with severe                         systemic disease that is a constant threat to life.                        - After reviewing the risks and benefits, the patient                         was deemed in satisfactory condition to undergo the                         procedure.                        After obtaining informed consent, the colonoscope was  passed under direct vision. Throughout the procedure,                         the patient's blood pressure, pulse, and oxygen                         saturations were monitored continuously. The                         Colonoscope was introduced through the anus and                          advanced to the the cecum, identified by appendiceal                         orifice and ileocecal valve. The colonoscopy was                         somewhat difficult due to poor endoscopic                         visualization. Successful completion of the procedure                         was aided by lavage. The patient tolerated the                         procedure well. The quality of the bowel preparation                         was poor. The ileocecal valve, appendiceal orifice,                         and rectum were photographed. Findings:      The perianal and digital rectal examinations were normal. Pertinent       negatives include normal sphincter tone and no palpable rectal lesions.      Many medium-mouthed and small-mouthed diverticula were found in the       entire colon. There was no evidence of diverticular bleeding.      Hematin (altered blood/coffee-ground-like material) was found in the       entire colon.      Active bleeding was seen in the cecum, secondary to previous polypectomy       procedure. For hemostasis, three hemostatic clips were successfully       placed (MR conditional). Clip manufacturer: [employer]. There was       no bleeding at the end of the procedure.      A 12 mm polyp was found in the sigmoid colon. The polyp was       pedunculated. Polypectomy was not attempted due to the patient having a       bleeding disorder.      Adherent Post polypectomy clips noted in the ascending colon without       stigmata of recent bleeding.      The exam was otherwise without abnormality. Impression:            - Preparation of the colon was poor.                        -  Mild diverticulosis in the entire examined colon.                         There was no evidence of diverticular bleeding.                        - Blood in the entire examined colon.                        - Bleeding in the cecum secondary to previous                          polypectomy. Clips (MR conditional) were placed. Clip                         manufacturer: [employer].                        - One 12 mm polyp in the sigmoid colon. Resection not                         attempted.                        - The examination was otherwise normal.                        - No specimens collected. Recommendation:        - Return patient to ICU for observation.                        - NPO.                        - ice chips and sips ok.                        - Serial H/H, Serial exams                        - GI service will continue to follow the patient's                         progress and closely observe for any changes in                         clinical status. Procedure Code(s):     --- Professional ---                        [phone], Colonoscopy, flexible; with control of                         bleeding, any method Diagnosis Code(s):     --- Professional ---                        K57.30, Diverticulosis of large intestine without                         perforation or abscess without bleeding  D12.5, Benign neoplasm of sigmoid colon                        K91.840, Postprocedural hemorrhage of a digestive                         system organ or structure following a digestive system                         procedure                        K92.2, Gastrointestinal hemorrhage, unspecified CPT copyright 2022 American Medical Association. All rights reserved. The codes documented in this report are preliminary and upon coder review may  be revised to meet current compliance requirements. Stanton Kidney MD, MD 12/13/2022 12:34:08 PM This report has been signed electronically. Number of Addenda: 0 Note Initiated On: 12/13/2022 10:48 AM Scope Withdrawal Time: 0 hours 17 minutes 38 seconds  Total Procedure Duration: 0 hours 29 minutes 5 seconds  Estimated Blood Loss:  Estimated blood loss: 25 mL requiring treatment with                          placement of hemostatic clip(s). Estimated blood loss                         was minimal.      [facility]

## 2022-12-13 NOTE — Progress Notes (Signed)
       CROSS COVER NOTE  NAME: Terry Stephens Sr. MRN: [id number] DOB : 13-Jun-1940    Concern as stated by nurse / staff   Follow up     Pertinent findings on chart review: See previous progress  Assessment and  Interventions   Assessment: Labs  Significant for  Corrected calcium -  7.6, Hgb - 8.4 Vbg - HCO3 1.5 Levo at 8 mcg/min Bloody stool x2 smaller amount and more clotted Sidebar sicussion with CCM NP agree to following Plan: Addition 2 amps bicarb 1 gm calcium gluconate  Additional unit of PRBC ordered for transfusion       Donnie Mesa NP [facility] Cross Cover 7pm-7am - check amion for availability Pager [phone]

## 2022-12-13 NOTE — Progress Notes (Addendum)
0800 Secure chatted with Dr. Norma Fredrickson and Dr. Dareen Piano about patient's multiple bright red stools with golf ball size clots  1040 Over to GI lab for colonoscopy.Wife with patient. 1300 Back from GI lab. 1400 Found wife in waiting room. 1800 Bed changed and cleaned of bloody rectal drainage. Noted to have a wet cough. Lungs sound are not really wet. Notified Dr. Dareen Piano of wet cough and requested a chest xray.

## 2022-12-13 NOTE — Progress Notes (Addendum)
PROGRESS NOTE  Terry Goulder Marotto Sr.    DOB: [birth year], 82 y.o.  OZD:[id number]    Code Status: Full Code   DOA: 12/12/2022   LOS: 1   Brief hospital course  [name] is a 82 y.o. male with medical history significant for CKD5, HTN, A-fib on Eliquis (last dose 12/2), CAD s/p CABG, ascending aortic dissection s/p repair 1974, bioprosthetic aortic valve, SVT on high-dose metoprolol succinate, history of syncope with prior loop recorder.  Presented due to rectal bleeding that started several hours following colonoscopy with polypectomy. Of note, patient stopped taking Eliquis since 12/20 preparation for his colonoscopy and has not yet resumed.  He was however taking a baby aspirin as advised by his cardiologist who did preprocedural cardiac clearance.   ED course: Hemodynamically stable with BP 160/76 and pulse 67 Labs notable for hemoglobin 10.6. Renal function at baseline with creatinine 4.3, bicarb 16 EKG: NSR 76 with PVCs and RBBB  Patient passed blood clots per rectum and in anticipation of an urgent colonoscopy was given an enema which lead to a large volume bloody stool and period of pre-syncope and hypotension. He got emergent release of 2 units pRBCs, and 1L fluid and remained hemodynamically stable throughout the night.  GI brought in for endoscopy 12/6 and performed clips to stop bleeding. Total he has received 3 units of blood and a short period of pressors overnight but is stable currently. Hgb has remained mildly above goal of 8 and repeat hgb is pending post procedure.  Assessment & Plan  Principal Problem:   Colonoscopy with polypectomy 12/11/22 with post-procedural bleeding Active Problems:   Hematochezia   Chronic anticoagulation   IDA (iron deficiency anemia)   Anemia in chronic kidney disease (CKD)   A-fib (HCC)   CAD s/p CABG (coronary artery disease)   Hypertension   CKD (chronic kidney disease) stage 5, GFR less than 15 ml/min (HCC)   GI bleed    Rectal bleeding  Acute blood loss anemia Colonoscopy with polypectomy 12/11/22 with post-procedural bleeding Hematochezia- s/p 3 units pRBCs and clips to bleeding source in colonoscopy today. Had period of needing pressors overnight but is now hemodynamically stable off of them. Repeat hgb after procedure still pending.  - continue strict I/O for blood loss - serial H&h, goal greater than 8 - follow with GI - CCM following   A-fib (HCC)  SVT  Chronic anticoagulation- tachycardic in setting of holding meds for the hypotension and anemia.  Patient on Toprol 150 mg twice daily at home Holding Eliquis for acute bleed - needs rate control so will give midodrine in interim to help support blood pressure and titrate back on home antiarrhythmics or else if this doesn't work will need to consider amiodarone gtt - continuous telemetry   CAD s/p CABG (coronary artery disease) Continue GDMT with metoprolol, losartan once BP can tolerate  - due to known CAD- goal for hgb is >8   CKD (chronic kidney disease) stage 5, GFR less than 15 ml/min (HCC) Renal function at baseline.  Monitor closely as there may be ATN with his episodes of hypotension - BMP am   Hypertension Continue losartan and metoprolol Close monitoring of blood pressure in the setting of GI bleed  Body mass index is 24.81 kg/m.  VTE ppx: none  Diet:     Diet   Diet NPO time specified   Consultants: GI CCM  Subjective 12/13/22    Pt reports no abdominal pain or nausea. Becomes  lightheaded intermittently. No CP or respiratory distress. Continues to have oozing of blood prior to coloscopy   Objective   Vitals:   12/13/22 1445 12/13/22 1500 12/13/22 1515 12/13/22 1600  BP: (!) 143/44 (!) 133/48 (!) 138/43 (!) 117/55  Pulse: (!) 110 (!) 109 (!) 105 (!) 111  Resp: (!) 25 (!) 26 (!) 23 18  Temp:      TempSrc:      SpO2: 95% 100% 98% 100%  Weight:      Height:        Intake/Output Summary (Last 24 hours) at  12/13/2022 1748 Last data filed at 12/13/2022 1647 Gross per 24 hour  Intake 5628.51 ml  Output 200 ml  Net 5428.51 ml   Filed Weights   12/11/22 2135  Weight: 76.2 kg    Physical Exam:  General: awake, alert, NAD HEENT: atraumatic, clear conjunctiva, anicteric sclera, MMM, hearing grossly normal Respiratory: normal respiratory effort. Cardiovascular: quick capillary refill, normal S1/S2, RRR, no JVD, murmurs Gastrointestinal: soft, NT, ND Nervous: A&O x3. no gross focal neurologic deficits, normal speech Extremities: moves all equally, no edema, normal tone Skin: dry, intact, normal temperature, normal color. No rashes, lesions or ulcers on exposed skin Psychiatry: normal mood, congruent affect  Labs   I have personally reviewed the following labs and imaging studies CBC    Component Value Date/Time   WBC 7.8 12/13/2022 0537   RBC 2.73 (L) 12/13/2022 0537   HGB 8.4 (L) 12/13/2022 0537   HGB 9.1 (L) 11/11/2022 1340   HCT 24.9 (L) 12/13/2022 0537   PLT 114 (L) 12/13/2022 0537   PLT 117 (L) 11/11/2022 1340   MCV 91.2 12/13/2022 0537   MCH 30.8 12/13/2022 0537   MCHC 33.7 12/13/2022 0537   RDW 16.0 (H) 12/13/2022 0537   LYMPHSABS 1.2 12/11/2022 2140   MONOABS 0.5 12/11/2022 2140   EOSABS 0.2 12/11/2022 2140   BASOSABS 0.1 12/11/2022 2140      Latest Ref Rng & Units 12/13/2022    5:37 AM 12/12/2022    4:18 AM 12/11/2022    9:40 PM  BMP  Glucose 70 - 99 mg/dL 119  147  829   BUN 8 - 23 mg/dL 62  77  77   Creatinine 0.61 - 1.24 mg/dL 5.62  1.30  8.65   Sodium 135 - 145 mmol/L 145  141  139   Potassium 3.5 - 5.1 mmol/L 3.9  3.8  3.5   Chloride 98 - 111 mmol/L 116  111  108   CO2 22 - 32 mmol/L 15  19  16    Calcium 8.9 - 10.3 mg/dL 6.7  7.7  8.1     No results found.  Disposition Plan & Communication  Patient status: Inpatient  Admitted From: Home Planned disposition location: Home Anticipated discharge date: 12/8 pending clinical stability   Family  Communication: none at bedside    Author: Leeroy Bock, DO [facility] 12/13/2022, 5:48 PM   Available by Epic secure chat 7AM-7PM. If 7PM-7AM, please contact night-coverage.  [facility] contact information found on [email or web address].

## 2022-12-14 ENCOUNTER — Inpatient Hospital Stay (HOSPITAL_COMMUNITY)
Admit: 2022-12-14 | Discharge: 2022-12-14 | Disposition: A | Discharge status: Home / Self Care | Payer: Medicare Other | Attending: Pulmonary Disease | Admitting: Pulmonary Disease

## 2022-12-14 DIAGNOSIS — R578 Other shock: Secondary | ICD-10-CM

## 2022-12-14 DIAGNOSIS — R571 Hypovolemic shock: Secondary | ICD-10-CM | POA: Diagnosis not present

## 2022-12-14 DIAGNOSIS — K91840 Postprocedural hemorrhage and hematoma of a digestive system organ or structure following a digestive system procedure: Secondary | ICD-10-CM | POA: Diagnosis not present

## 2022-12-14 DIAGNOSIS — R569 Unspecified convulsions: Secondary | ICD-10-CM

## 2022-12-14 LAB — BASIC METABOLIC PANEL
Anion gap: 10 (ref 5–15)
BUN: 57 mg/dL — ABNORMAL HIGH (ref 8–23)
CO2: 24 mmol/L (ref 22–32)
Calcium: 7.1 mg/dL — ABNORMAL LOW (ref 8.9–10.3)
Chloride: 112 mmol/L — ABNORMAL HIGH (ref 98–111)
Creatinine, Ser: 3.85 mg/dL — ABNORMAL HIGH (ref 0.61–1.24)
GFR, Estimated: 15 mL/min — ABNORMAL LOW (ref >60)
Glucose, Bld: 127 mg/dL — ABNORMAL HIGH (ref 70–99)
Potassium: 3.4 mmol/L — ABNORMAL LOW (ref 3.5–5.1)
Sodium: 146 mmol/L — ABNORMAL HIGH (ref 135–145)

## 2022-12-14 LAB — GLUCOSE, CAPILLARY
Glucose-Capillary: 117 mg/dL — ABNORMAL HIGH (ref 70–99)
Glucose-Capillary: 116 mg/dL — ABNORMAL HIGH (ref 70–99)
Glucose-Capillary: 197 mg/dL — ABNORMAL HIGH (ref 70–99)
Glucose-Capillary: 127 mg/dL — ABNORMAL HIGH (ref 70–99)
Glucose-Capillary: 218 mg/dL — ABNORMAL HIGH (ref 70–99)
Glucose-Capillary: 135 mg/dL — ABNORMAL HIGH (ref 70–99)

## 2022-12-14 LAB — HEMOGLOBIN AND HEMATOCRIT, BLOOD
HCT: 20 % — ABNORMAL LOW (ref 39.0–52.0)
HCT: 22.2 % — ABNORMAL LOW (ref 39.0–52.0)
HCT: 22.8 % — ABNORMAL LOW (ref 39.0–52.0)
Hemoglobin: 6.9 g/dL — ABNORMAL LOW (ref 13.0–17.0)
Hemoglobin: 7.5 g/dL — ABNORMAL LOW (ref 13.0–17.0)
Hemoglobin: 7.6 g/dL — ABNORMAL LOW (ref 13.0–17.0)

## 2022-12-14 LAB — CBC
HCT: 21.3 % — ABNORMAL LOW (ref 39.0–52.0)
HCT: 20.7 % — ABNORMAL LOW (ref 39.0–52.0)
Hemoglobin: 7.3 g/dL — ABNORMAL LOW (ref 13.0–17.0)
Hemoglobin: 7.1 g/dL — ABNORMAL LOW (ref 13.0–17.0)
MCH: 29.9 pg (ref 26.0–34.0)
MCH: 29.5 pg (ref 26.0–34.0)
MCHC: 34.3 g/dL (ref 30.0–36.0)
MCHC: 34.3 g/dL (ref 30.0–36.0)
MCV: 87.3 fL (ref 80.0–100.0)
MCV: 85.9 fL (ref 80.0–100.0)
Platelets: 102 10*3/uL — ABNORMAL LOW (ref 150–400)
Platelets: 101 10*3/uL — ABNORMAL LOW (ref 150–400)
RBC: 2.44 MIL/uL — ABNORMAL LOW (ref 4.22–5.81)
RBC: 2.41 MIL/uL — ABNORMAL LOW (ref 4.22–5.81)
RDW: 17.6 % — ABNORMAL HIGH (ref 11.5–15.5)
RDW: 18 % — ABNORMAL HIGH (ref 11.5–15.5)
WBC: 12.8 10*3/uL — ABNORMAL HIGH (ref 4.0–10.5)
WBC: 12.8 10*3/uL — ABNORMAL HIGH (ref 4.0–10.5)
nRBC: 0.4 % — ABNORMAL HIGH (ref 0.0–0.2)
nRBC: 0.7 % — ABNORMAL HIGH (ref 0.0–0.2)

## 2022-12-14 LAB — APTT: aPTT: 39 s — ABNORMAL HIGH (ref 24–36)

## 2022-12-14 LAB — PREPARE FRESH FROZEN PLASMA

## 2022-12-14 LAB — MAGNESIUM: Magnesium: 1.6 mg/dL — ABNORMAL LOW (ref 1.7–2.4)

## 2022-12-14 LAB — BPAM FFP
Blood Product Expiration Date: 202412112359
ISSUE DATE / TIME: 202412062154
Unit Type and Rh: 5100

## 2022-12-14 LAB — DIFFERENTIAL
Abs Immature Granulocytes: 0.09 10*3/uL — ABNORMAL HIGH (ref 0.00–0.07)
Basophils Absolute: 0 10*3/uL (ref 0.0–0.1)
Basophils Relative: 0 %
Eosinophils Absolute: 0 10*3/uL (ref 0.0–0.5)
Eosinophils Relative: 0 %
Immature Granulocytes: 1 %
Lymphocytes Relative: 9 %
Lymphs Abs: 1.1 10*3/uL (ref 0.7–4.0)
Monocytes Absolute: 1.3 10*3/uL — ABNORMAL HIGH (ref 0.1–1.0)
Monocytes Relative: 10 %
Neutro Abs: 10.2 10*3/uL — ABNORMAL HIGH (ref 1.7–7.7)
Neutrophils Relative %: 80 %

## 2022-12-14 LAB — D-DIMER, QUANTITATIVE: D-Dimer, Quant: 0.44 ug{FEU}/mL (ref 0.00–0.50)

## 2022-12-14 LAB — TECHNOLOGIST SMEAR REVIEW: Plt Morphology: NORMAL

## 2022-12-14 LAB — FIBRINOGEN: Fibrinogen: 216 mg/dL (ref 210–475)

## 2022-12-14 LAB — LACTIC ACID, PLASMA: Lactic Acid, Venous: 1.3 mmol/L (ref 0.5–1.9)

## 2022-12-14 LAB — STREP PNEUMONIAE URINARY ANTIGEN: Strep Pneumo Urinary Antigen: NEGATIVE

## 2022-12-14 LAB — PROTIME-INR
INR: 1.4 — ABNORMAL HIGH (ref 0.8–1.2)
Prothrombin Time: 17 s — ABNORMAL HIGH (ref 11.4–15.2)

## 2022-12-14 LAB — PROCALCITONIN: Procalcitonin: 2.16 ng/mL

## 2022-12-14 LAB — PHOSPHORUS: Phosphorus: 3.9 mg/dL (ref 2.5–4.6)

## 2022-12-14 MED ORDER — MAGNESIUM SULFATE 2 GM/50ML IV SOLN
2.0000 g | Freq: Once | INTRAVENOUS | Status: AC
  Administered 2022-12-14: 2 g via INTRAVENOUS
  Filled 2022-12-14: qty 50

## 2022-12-14 MED ORDER — SODIUM CHLORIDE 0.9 % IV SOLN: 3.0000 g | Freq: Once | INTRAVENOUS | Status: DC

## 2022-12-14 MED ORDER — CALCIUM GLUCONATE-NACL 2-0.675 GM/100ML-% IV SOLN
2.0000 g | Freq: Once | INTRAVENOUS | Status: AC
  Administered 2022-12-14: 2000 mg via INTRAVENOUS
  Filled 2022-12-14: qty 100

## 2022-12-14 MED ORDER — AZITHROMYCIN 500 MG IV SOLR
500.0000 mg | INTRAVENOUS | Status: DC
  Administered 2022-12-14: 500 mg via INTRAVENOUS
  Filled 2022-12-14: qty 5

## 2022-12-14 MED ORDER — MIDODRINE HCL 5 MG PO TABS
10.0000 mg | ORAL_TABLET | Freq: Three times a day (TID) | ORAL | Status: DC
  Administered 2022-12-14 – 2022-12-16 (×2): 10 mg via ORAL
  Filled 2022-12-14 (×2): qty 2

## 2022-12-14 MED ORDER — MAGNESIUM SULFATE IN D5W 1-5 GM/100ML-% IV SOLN
1.0000 g | Freq: Once | INTRAVENOUS | Status: AC
Start: 2022-12-14 — End: 2022-12-14
  Administered 2022-12-14: 1 g via INTRAVENOUS
  Filled 2022-12-14: qty 100

## 2022-12-14 MED ORDER — HYDROCORTISONE SOD SUC (PF) 100 MG IJ SOLR
100.0000 mg | Freq: Three times a day (TID) | INTRAMUSCULAR | Status: AC
  Administered 2022-12-14 (×2): 100 mg via INTRAVENOUS
  Filled 2022-12-14 (×2): qty 2

## 2022-12-14 MED ORDER — SODIUM CHLORIDE 0.9% IV SOLUTION: Freq: Once | INTRAVENOUS | Status: AC

## 2022-12-14 MED ORDER — POTASSIUM CHLORIDE CRYS ER 20 MEQ PO TBCR
20.0000 meq | EXTENDED_RELEASE_TABLET | Freq: Once | ORAL | Status: AC
  Administered 2022-12-14: 20 meq via ORAL
  Filled 2022-12-14: qty 1

## 2022-12-14 NOTE — Progress Notes (Signed)
PHARMACY CONSULT NOTE - FOLLOW UP  Pharmacy Consult for Electrolyte Monitoring and Replacement   Recent Labs: Potassium (mmol/L)  Date Value  12/14/2022 3.4 (L)   Magnesium (mg/dL)  Date Value  59/56/3875 1.6 (L)   Calcium (mg/dL)  Date Value  64/33/2951 7.1 (L)   Albumin (g/dL)  Date Value  88/41/6606 1.8 (L)   Sodium (mmol/L)  Date Value  12/14/2022 146 (H)   Corrected Ca: 8.9 mg/dL  Assessment: with PMHx significant for Atrial Fibrillation on Eliquis, Chronic HFrEF, CAD s/p CABG, Aortic insufficiency s/p bioprosthetic Aortic Valve Replacement, and CKD Stage V, who is admitted with Acute Blood Loss Anemia in the setting of Acute Lower GI Bleed post colonoscopy with polypectomy on 12/11/22. Pharmacy is asked to follow and replace electrolytes while in CCU.   Goal of Therapy:  Electrolytes WNL  Plan:  ---20 mEq po KCl x 1 ---1 gram IV magnesium sulfate x 1 ---recheck electrolytes in am  Lowella Bandy ,PharmD Clinical Pharmacist 12/14/2022 11:17 AM

## 2022-12-14 NOTE — Progress Notes (Signed)
GI Inpatient Follow-up Note  Subjective:  Patient seen in follow-up for post-polypectomy bleed. He is s/p colonoscopy yesterday which showed active bleeding in cecum at polypectomy site where 3 hemostatic clips were placed for hemostasis. Around 7 PM last night he became hypotensive again and was placed on Levophed. Central line was placed by ICU team. He received 1 unit pRBCs and 1 unit FFP yesterday. Hemoglobin 6.9 this morning and he is receiving 1 unit pRBCs at time of my visit. He was also found to have pneumonia on imaging yesterday and was started on IV Zosyn. There is concern for hypovolemic shock. He denies any fevers, chills, nausea, vomiting, chest pain, shortness of breath, or palpitations. He does endorse dry cough. Wife not present at bedside.   Scheduled Inpatient Medications:   atorvastatin  20 mg Oral Daily   Chlorhexidine Gluconate Cloth  6 each Topical Daily   pantoprazole (PROTONIX) IV  40 mg Intravenous Q12H   potassium chloride  20 mEq Oral Once   sodium bicarbonate  650 mg Oral BID   sodium chloride flush  10 mL Intravenous Q12H   sodium chloride flush  10-40 mL Intracatheter Q12H   tamsulosin  0.4 mg Oral QPC supper    Continuous Inpatient Infusions:    azithromycin Stopped (12/13/22 2143)   lactated ringers 999 mL/hr at 12/14/22 0600   lactated ringers 999 mL/hr at 12/14/22 0600   norepinephrine (LEVOPHED) Adult infusion 8 mcg/min (12/14/22 0600)   piperacillin-tazobactam (ZOSYN)  IV Stopped (12/14/22 0542)   sodium bicarbonate 150 mEq in sterile water 1,150 mL infusion 50 mL/hr at 12/14/22 0600    PRN Inpatient Medications:  acetaminophen **OR** acetaminophen, HYDROcodone-acetaminophen, morphine injection, ondansetron **OR** ondansetron (ZOFRAN) IV, mouth rinse, sodium chloride flush  Review of Systems: Constitutional: Weight is stable.  Eyes: No changes in vision. ENT: No oral lesions, sore throat.  GI: see HPI.  Heme/Lymph: No easy bruising.  CV: No  chest pain.  GU: No hematuria.  Integumentary: No rashes.  Neuro: No headaches.  Psych: No depression/anxiety.  Endocrine: No heat/cold intolerance.  Allergic/Immunologic: No urticaria.  Resp: No cough, SOB.  Musculoskeletal: No joint swelling.    Physical Examination: BP 113/70   Pulse 83   Temp 98.9 F (37.2 C) (Oral)   Resp 16   Ht 5\' 9"  (1.753 m)   Wt 76.2 kg   SpO2 99%   BMI 24.81 kg/m  Gen: NAD, alert and oriented x 4 HEENT: PEERLA, EOMI, Neck: supple, no JVD or thyromegaly Chest: CTA bilaterally, no wheezes, crackles, or other adventitious sounds CV: RRR, no m/g/c/r Abd: soft, NT, ND, +BS in all four quadrants; no HSM, guarding, ridigity, or rebound tenderness Ext: no edema, well perfused with 2+ pulses, Skin: no rash or lesions noted Lymph: no LAD  Data: Lab Results  Component Value Date   WBC 12.8 (H) 12/14/2022   HGB 6.9 (L) 12/14/2022   HCT 20.0 (L) 12/14/2022   MCV 85.9 12/14/2022   PLT 101 (L) 12/14/2022   Recent Labs  Lab 12/14/22 0149 12/14/22 0435 12/14/22 0554  HGB 7.3* 7.1* 6.9*   Lab Results  Component Value Date   NA 146 (H) 12/14/2022   K 3.4 (L) 12/14/2022   CL 112 (H) 12/14/2022   CO2 24 12/14/2022   BUN 57 (H) 12/14/2022   CREATININE 3.85 (H) 12/14/2022   Lab Results  Component Value Date   ALT 8 12/13/2022   AST 15 12/13/2022   ALKPHOS 33 (L) 12/13/2022  BILITOT 0.9 12/13/2022   Recent Labs  Lab 12/14/22 0149  APTT 39*  INR 1.4*   CSY 12/13/2022 - 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, hematin found in entire colon, active bleeding seen in cecum 2/2 previous polypectomy procedure with three hemostatic clips placed, one 12 mm polyp found in sigmoid colon not removed, adherent post-polypectomy clips noted in ascending colon without stigmata of recent bleeding  Assessment/Plan:  82 y/o Caucasian male with a PMH of obesity, HTN, mixed HLD, PAF on chronic anticoagulation (held starting 12/2), CKD Stage  V, CAD s/p CABG, hx of ascending aortic dissection s/p repair 1974, s/p bioprosthetic aortic valve, and SVT presented to the [facility] ED 12/5 for chief complaint of hematochezia c/w PPB s/p colonoscopy 12/6 with active bleeding in cecum requiring three hemostatic clips for endoscopic hemostasis. GI following.    Post-polypectomy bleed (PPB) from cecum    Acute blood loss anemia - 2/2 PPB. Hemoglobin 6.9 this morning. No overt gastrointestinal bleeding overnight or this morning.   Hypovolemic shock   Pneumonia   IDA/anemia of chronic disease  CAD s/p CABG  CKD Stage V  Chronic anticoagulation    Recommendations:  - Maintain 2 large bore IVs for access - Continue to monitor serial H&H. Goal hemoglobin 8.0 or higher. Transfuse PRN to keep hemoglobin >8.0.  - Supportive care with IV fluid hydration, pain control, and antiemetics prn - Continue management of shock/pneumonia per critical care team - Continue Protonix 40 mg BID for gastric protection  - No signs of ongoing GI bleeding at this time. Continue to monitor for s/s GIB. - No indication for repeat luminal evaluation at this time - GI available as needed for concerns of bleeding - Will start clear liquid diet and ADAT - GI following along with you   Please call with questions or concerns.   Jacob Moores, PA-C [facility] [phone]

## 2022-12-14 NOTE — Progress Notes (Signed)
  Echocardiogram 2D Echocardiogram has been performed.  Terry Stephens 12/14/2022, 7:08 PM

## 2022-12-14 NOTE — Progress Notes (Signed)
NAME:  Terry VANEGAS Sr., MRN:  [id number], DOB:  07/29/1940, LOS: 2 ADMISSION DATE:  12/12/2022, CONSULTATION DATE:  12/13/2022 REFERRING MD:  Manuela Schwartz, NP, CHIEF COMPLAINT:  Hypotension   Brief Pt Description / Synopsis:  82 y.o. 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  Course complicated by shock and concern for developing pneumonia.  History of Present Illness:  Terry MCLEISH Sr. is a 82 y.o. male with medical history significant for CKD5, HTN, A-fib on Eliquis (last dose 12/2), CAD s/p CABG, ascending aortic dissection s/p repair 1974, bioprosthetic aortic valve, SVT on high-dose metoprolol succinate, history of syncope with prior loop recorder who presented to [facility] ED on 12/11/22 due to complaints of rectal bleeding that started several hours following Colonoscopy and polypectomy.  He underwent elective EGD and Colonoscopy on 12/11/22 for workup of iron deficiency anemia, which required snare polypectomy of 15 polyps with placement of multiple hemostatic clips.  He reported that several hours after the procedure he felt like he was oozing blood and began passing dark clots. He endorsed mild lightheadedness but denies chest pain, shortness of breath or palpitations,  but denied abdominal pain.  Of note, patient stopped taking Eliquis since 12/20 preparation for his colonoscopy and has not yet resumed.  He was however taking a baby aspirin as advised by his cardiologist who did preprocedural cardiac clearance.   ED Course: Initial Vital Signs: Temperature 97.5 F, RR 18, BP 160/76 and pulse 67, SpO2 100% Significant Labs:  hemoglobin 10.6.  Baseline of 9. Renal function at baseline with creatinine 4.3,  bicarb 16  Hospitalists were asked to admit for further workup and treatment.  GI was consulted.  Please see "Significant Hospital Events" section below for full detailed hospital course.   Pertinent  Medical History   Past Medical History:  Diagnosis Date   A-fib (HCC)    Allergic rhinitis    CAD (coronary artery disease)    Chronic kidney disease    Hypertension      Micro Data:  12/5: MRSA PCR>>negative 12/6: Blood culture x2>> 12/6: Sputum>> 12/6: Strep pneumo & Legionella urinary antigens>>  Antimicrobials:   Anti-infectives (From admission, onward)    Start     Dose/Rate Route Frequency Ordered Stop   12/13/22 2200  piperacillin-tazobactam (ZOSYN) IVPB 2.25 g        2.25 g 100 mL/hr over 30 Minutes Intravenous Every 8 hours 12/13/22 2107     12/13/22 2100  azithromycin (ZITHROMAX) 500 mg in dextrose 5 % 250 mL IVPB        500 mg 255 mL/hr over 60 Minutes Intravenous Every 24 hours 12/13/22 1955 12/18/22 2059   12/13/22 2030  cefTRIAXone (ROCEPHIN) 1 g in sodium chloride 0.9 % 100 mL IVPB  Status:  Discontinued        1 g 200 mL/hr over 30 Minutes Intravenous Every 24 hours 12/13/22 1955 12/13/22 2036        Significant Hospital Events: Including procedures, antibiotic start and stop dates in addition to other pertinent events   12/4: Underwent elective EGD and Colostomy for evaluation of IDA, with polypectomy.  Went home post procedure, however in the following hours at home, passing bright red blood and clots from  rectum.  Went to ED for evaluation. 12/5: [facility] admitted. GI consulted. Received enema for repeat colonoscopy with loss of large amount of bright red blood per rectum with associated hypotension.  Was given fluids and 2 units blood, required low dose peripheral Levophed 12/6: Repeat Colonoscopy performed.  Late in the evening, again Hypotensive, given 1 unit of blood and placed on Levophed. PCCM consulted,  Central line placed. 12/7 severe hypovolumic  shock  Interim History / Subjective:  Severe hypovolumic shock +ARF Restarted on pressors   Objective   Blood pressure 113/70, pulse 83, temperature 98.9 F (37.2 C), temperature source Oral, resp. rate 16, height 5\' 9"  (1.753 m), weight 76.2 kg, SpO2 99%.        Intake/Output Summary (Last 24 hours) at 12/14/2022 0716 Last data filed at 12/14/2022 0600 Gross per 24 hour  Intake 34252.42 ml  Output 600 ml  Net 33652.42 ml   Filed Weights   12/11/22 2135  Weight: 76.2 kg      Review of Systems: Gen:  Denies  fever, sweats, chills weight loss  HEENT: Denies blurred vision, double vision, ear pain, eye pain, hearing loss, nose bleeds, sore throat Cardiac:  No dizziness, chest pain or heaviness, chest tightness,edema, No JVD Resp:   No cough, -sputum production, -shortness of breath,-wheezing, -hemoptysis,  Other:  All other systems negative   Physical Examination:   General Appearance: No distress  EYES PERRLA, EOM intact.   NECK Supple, No JVD Pulmonary: normal breath sounds, No wheezing.  CardiovascularNormal S1,S2.  No m/r/g.   Abdomen: Benign, Soft, non-tender. Neurology UE/LE 5/5 strength, no focal deficits Ext +edema ALL OTHER ROS ARE NEGATIVE      Assessment & Plan:   82 yo white male with acute severe hypovolumic shock ?pneumonia with underlying afib with RVR with chronic HFrEF SVT, CAD s/p CABG, Aortic insufficiency s/p AVR (bioprosthetic) in 2012, HLD  ABLA ANEMIA AND HYPOVOLUMIC shock SOURCE-GIB Hematochezia due to acute Lower GI Bleed, post-procedural bleeding following Colonoscopy with Polypectomy on 12/11/22 Thrombocytopenia -use vasopressors to keep MAP>65 as needed -follow ABG and LA as needed -follow up cultures -emperic ABX -aggressive IV fluid Resuscitation    CARDIAC Echocardiogram 02/2022: LVEF 40%, enlarged RV size and reduced RV function, normal function bioprosthetic aortic valve, mild MR -Continuous cardiac monitoring -Maintain  MAP >65 -Cautious IV fluids -Transfusions as indicated -Vasopressors as needed to maintain MAP goal -Trend lactic acid until normalized -Trend HS Troponin until peaked -Repeat Echocardiogram pending -Hold home antihypertensives (metoprolol and losartan) -Holding Eliquis due to GI bleed ~ will defer to GI and Cardiology on when to resume [facility] -May need to consider Amiodarone for rate control -Will consult Cardiology in the morning    GIB S/p repeat Colonoscopy on 12/6 -GI following, appreciate input -Monitor for S/Sx of bleeding -Trend CBC (H&H q6h) -SCD's for VTE Prophylaxis (chemical ppx contraindicated) -Transfuse for Hgb <8 ~ transfusing 1 unit pRBC's and 1 FFP on 12/6 -Transfuse platelets for platelet count <10 K or for count <50 K with active bleeding -Continue Protonix 40 mg BID   RENAL Metabolic Acidosis in setting of lactic acidosis and AKI AKI on CKD Stage V -continue Foley Catheter-assess need -Avoid nephrotoxic agents -Follow urine output, BMP -Ensure adequate renal perfusion, optimize oxygenation -Renal dose medications BiCarb infusion   Intake/Output Summary (Last 24 hours) at 12/14/2022 0721 Last data filed at 12/14/2022 0600 Gross per 24 hour  Intake 34252.42 ml  Output 600 ml  Net 33652.42 ml  Pt is critically ill, prognosis is extremely guarded. High risk for further decompensation, cardiac arrest and death.  Given current critical illness superimposed on multiple chronic co morbidities and advanced age, would recommend DNR/DNI status.   Best Practice (right click and "Reselect all SmartList Selections" daily)   Diet/type: NPO DVT prophylaxis: SCD (chemical ppx contraindicated) GI prophylaxis: PPI Lines: Central line Foley:  Yes, and it is still needed Code Status:  full code Last date of multidisciplinary goals of care discussion [N/A]  12/6: Pt updated at bedside, pt's wife updated via telephone by [facility] NP.  Labs   CBC: Recent Labs  Lab  12/11/22 2140 12/12/22 0418 12/13/22 0537 12/13/22 1703 12/13/22 1948 12/13/22 1949 12/14/22 0149 12/14/22 0435 12/14/22 0554  WBC 4.7  --  7.8 6.0  --  7.6 12.8* 12.8*  --   NEUTROABS 2.8  --   --   --   --  6.0 10.2*  --   --   HGB 10.6*   < > 8.4* 7.1* 9.1* 9.2* 7.3* 7.1* 6.9*  HCT 34.7*  --  24.9* 21.1* 27.7* 27.4* 21.3* 20.7* 20.0*  MCV 99.1  --  91.2 88.3  --  89.3 87.3 85.9  --   PLT 154  --  114* 93*  --  81* 102* 101*  --    < > = values in this interval not displayed.    Basic Metabolic Panel: Recent Labs  Lab 12/12/22 0418 12/13/22 0537 12/13/22 1949 12/13/22 2212 12/14/22 0435  NA 141 145 145 147* 146*  K 3.8 3.9 6.0* 3.5 3.4*  CL 111 116* 118* 113* 112*  CO2 19* 15* 21* 24 24  GLUCOSE 103* 127* 191* 146* 127*  BUN 77* 62* 48* 57* 57*  CREATININE 4.26* 3.75* 3.15* 3.67* 3.85*  CALCIUM 7.7* 6.7* 6.1* 6.6* 7.1*  MG  --  1.8  --   --   --    GFR: Estimated Creatinine Clearance: 14.5 mL/min (A) (by C-G formula based on SCr of 3.85 mg/dL (H)). Recent Labs  Lab 12/13/22 1703 12/13/22 1942 12/13/22 1949 12/13/22 2212 12/14/22 0149 12/14/22 0435  PROCALCITON  --   --  0.96  --   --  2.16  WBC 6.0  --  7.6  --  12.8* 12.8*  LATICACIDVEN  --  5.3*  --  1.8  --   --     Liver Function Tests: Recent Labs  Lab 12/13/22 0537 12/13/22 1949  AST 12* 15  ALT 11 8  ALKPHOS 45 33*  BILITOT 1.1 0.9  PROT 3.7* 3.0*  ALBUMIN 2.3* 1.8*   No results for input(s): "LIPASE", "AMYLASE" in the last 168 hours. No results for input(s): "AMMONIA" in the last 168 hours.  ABG    Component Value Date/Time   HCO3 14.2 (L) 12/13/2022 1951   ACIDBASEDEF 17.6 (H) 12/13/2022 1951   O2SAT 52.3 12/13/2022 1951     Coagulation Profile: Recent Labs  Lab 12/12/22 0142 12/14/22 0149  INR 1.3* 1.4*    Cardiac Enzymes: No results for input(s): "CKTOTAL", "CKMB", "CKMBINDEX", "TROPONINI" in the last 168 hours.  HbA1C: No results found for: "HGBA1C"  CBG: Recent  Labs  Lab 12/13/22 0332 12/13/22 1614 12/13/22 1931 12/13/22 2308 12/14/22 0326  GLUCAP 108* 105* 101* 133* 117*    Past Medical History:  He,  has a past medical history of A-fib (HCC), Allergic rhinitis, CAD (coronary artery disease), Chronic kidney disease, and Hypertension.   Surgical History:   Past  Surgical History:  Procedure Laterality Date   AORTIC VALVE REPAIR     AV FISTULA PLACEMENT Left 02/2022   BIOPSY  12/11/2022   Procedure: BIOPSY;  Surgeon: Norma Fredrickson, Boykin Nearing, MD;  Location: [facility] ENDOSCOPY;  Service: Gastroenterology;;   COLONOSCOPY N/A 12/13/2022   Procedure: COLONOSCOPY;  Surgeon: Toledo, Boykin Nearing, MD;  Location: [facility] ENDOSCOPY;  Service: Gastroenterology;  Laterality: N/A;   COLONOSCOPY WITH PROPOFOL N/A 12/11/2022   Procedure: COLONOSCOPY WITH PROPOFOL;  Surgeon: Toledo, Boykin Nearing, MD;  Location: [facility] ENDOSCOPY;  Service: Gastroenterology;  Laterality: N/A;   CORONARY ARTERY BYPASS GRAFT     ESOPHAGOGASTRODUODENOSCOPY (EGD) WITH PROPOFOL N/A 12/11/2022   Procedure: ESOPHAGOGASTRODUODENOSCOPY (EGD) WITH PROPOFOL;  Surgeon: Toledo, Boykin Nearing, MD;  Location: [facility] ENDOSCOPY;  Service: Gastroenterology;  Laterality: N/A;   HEMOSTASIS CLIP PLACEMENT  12/11/2022   Procedure: HEMOSTASIS CLIP PLACEMENT;  Surgeon: Norma Fredrickson, Boykin Nearing, MD;  Location: [facility] ENDOSCOPY;  Service: Gastroenterology;;   POLYPECTOMY  12/11/2022   Procedure: POLYPECTOMY;  Surgeon: Toledo, Boykin Nearing, MD;  Location: [facility] ENDOSCOPY;  Service: Gastroenterology;;     Social History:   reports that he has quit smoking. He has never used smokeless tobacco. He reports that he does not drink alcohol and does not use drugs.   Family History:  His family history includes Aneurysm in his mother; Heart attack in his brother, father, and sister.   Allergies Allergies  Allergen Reactions   Phenothiazines Other (See Comments)    Extra-pyramidal reaction.   Extra-pyramidal reaction   Prochlorperazine  Edisylate      Home Medications  Prior to Admission medications   Medication Sig Start Date End Date Taking? Authorizing Provider  acetaminophen (TYLENOL) 500 MG tablet Take 1,000 mg by mouth every 6 (six) hours as needed for mild pain (pain score 1-3).   Yes [provider]  amoxicillin (AMOXIL) 500 MG tablet Take 2,000 mg by mouth once. 12/03/22  Yes [provider]  atorvastatin (LIPITOR) 20 MG tablet Take 1 tablet (20 mg total) by mouth daily. 05/29/22  Yes Antonieta Iba, MD  Cholecalciferol (VITAMIN D3) 2000 units capsule Take 2,000 Units by mouth daily.   Yes [provider]  diclofenac Sodium (VOLTAREN) 1 % GEL Apply topically 4 (four) times daily as needed.   Yes [provider]  ELIQUIS 2.5 MG TABS tablet Take 1 tablet (2.5 mg total) by mouth 2 (two) times daily. 05/29/22  Yes Antonieta Iba, MD  esomeprazole (NEXIUM) 40 MG capsule Take 40 mg by mouth as needed.   Yes [provider]  fluticasone (FLONASE) 50 MCG/ACT nasal spray Place 2 sprays into both nostrils daily. 04/25/09  Yes [provider]  furosemide (LASIX) 40 MG tablet Take 1 tablet (40 mg total) by mouth 2 (two) times daily. 05/29/22  Yes Antonieta Iba, MD  losartan (COZAAR) 50 MG tablet Take 1 tablet (50 mg total) by mouth daily. 05/29/22  Yes Antonieta Iba, MD  metoprolol succinate (TOPROL-XL) 100 MG 24 hr tablet Take 1 tablet (100 mg total) by mouth 2 (two) times daily. Take with or immediately following a meal. Patient taking differently: Take 150 mg by mouth 2 (two) times daily. Take with or immediately following a meal. 05/29/22  Yes Gollan, Tollie Pizza, MD  psyllium (METAMUCIL) 0.52 g capsule Take 0.52 g by mouth daily.   Yes [provider]  Saw Palmetto 450 MG CAPS Take 450 mg by mouth daily.   Yes [provider]  sodium bicarbonate  650 MG tablet Take 650 mg by mouth 2 (two) times daily.   Yes [provider]  tacrolimus  (PROTOPIC) 0.1 % ointment Apply topically. 08/17/12  Yes [provider]  tamsulosin (FLOMAX) 0.4 MG CAPS capsule Take 0.4 mg by mouth daily after supper.   Yes [provider]      DVT/GI PRX  assessed I Assessed the need for Labs I Assessed the need for Foley I Assessed the need for Central Venous Line Family Discussion when available I Assessed the need for Mobilization I made an Assessment of medications to be adjusted accordingly Safety Risk assessment completed  CASE DISCUSSED IN MULTIDISCIPLINARY ROUNDS WITH ICU TEAM     Critical Care Time devoted to patient care services described in this note is 65 minutes.  Critical care was necessary to treat /prevent imminent and life-threatening deterioration. Overall, patient is critically ill, prognosis is guarded.  Patient with Multiorgan failure and at high risk for cardiac arrest and death.    Lucie Leather, M.D.  [facility]  Medical Director [facility] Medical Director [facility] Cardio-Pulmonary Department

## 2022-12-14 NOTE — Progress Notes (Signed)
Transferred under ICU care for GI bleed, Shock and needing IV vasopressors with BT's. D/w Dr Belia Heman

## 2022-12-14 NOTE — Consult Note (Signed)
[facility]  CONSULT NOTE    Date: 12/14/2022                  Patient Name:  Terry SALASAR Sr.  MRN: [id number]  DOB: 08-Jan-1941  Age / Sex: 82 y.o., male         PCP: Jaclyn Shaggy, MD                 Service Requesting Consult: Dr. Belia Heman                 Reason for Consult: Acute Kidney Injury            History of Present Illness: [name] Underwent colonoscopy on 12/4 where he had a polypectomy. Patient then had bleeding. Patient was then admitted for further care. Placed on norepinephrine and sodium bicarb gtt.   Nephrology consulted for renal function. Patient with chronic kidney disease V followed by [facility], Dr. Sydnee Levans.   Patient currently with no complaints this morning. Mentation well.    Medications: Outpatient medications: Medications Prior to Admission  Medication Sig Dispense Refill Last Dose   acetaminophen (TYLENOL) 500 MG tablet Take 1,000 mg by mouth every 6 (six) hours as needed for mild pain (pain score 1-3).   12/11/2022   amoxicillin (AMOXIL) 500 MG tablet Take 2,000 mg by mouth once.   12/11/2022   atorvastatin (LIPITOR) 20 MG tablet Take 1 tablet (20 mg total) by mouth daily. 90 tablet 3 Past Week   Cholecalciferol (VITAMIN D3) 2000 units capsule Take 2,000 Units by mouth daily.   Past Week   diclofenac Sodium (VOLTAREN) 1 % GEL Apply topically 4 (four) times daily as needed.   prn at unknown   ELIQUIS 2.5 MG TABS tablet Take 1 tablet (2.5 mg total) by mouth 2 (two) times daily. 180 tablet 3 12/08/2022   esomeprazole (NEXIUM) 40 MG capsule Take 40 mg by mouth as needed.   prn at unknown   fluticasone (FLONASE) 50 MCG/ACT nasal spray Place 2 sprays into both nostrils daily.   prn at unknown   furosemide (LASIX) 40 MG tablet Take 1 tablet (40 mg total) by mouth 2 (two) times daily. 180 tablet 3 Past Week   losartan (COZAAR) 50 MG tablet Take 1 tablet (50 mg total) by mouth daily. 90 tablet 3 12/11/2022    metoprolol succinate (TOPROL-XL) 100 MG 24 hr tablet Take 1 tablet (100 mg total) by mouth 2 (two) times daily. Take with or immediately following a meal. (Patient taking differently: Take 150 mg by mouth 2 (two) times daily. Take with or immediately following a meal.) 180 tablet 3 12/11/2022   psyllium (METAMUCIL) 0.52 g capsule Take 0.52 g by mouth daily.   Past Week   Saw Palmetto 450 MG CAPS Take 450 mg by mouth daily.   Past Week   sodium bicarbonate 650 MG tablet Take 650 mg by mouth 2 (two) times daily.   Past Week   tacrolimus (PROTOPIC) 0.1 % ointment Apply topically.   prn at unknown   tamsulosin (FLOMAX) 0.4 MG CAPS capsule Take 0.4 mg by mouth daily after supper.   12/11/2022    Current medications: Current Facility-Administered Medications  Medication Dose Route Frequency Provider Last Rate Last Admin   acetaminophen (TYLENOL) tablet 650 mg  650 mg Oral Q6H PRN Andris Baumann, MD       Or   acetaminophen (TYLENOL) suppository 650 mg  650 mg  Rectal Q6H PRN Andris Baumann, MD       atorvastatin (LIPITOR) tablet 20 mg  20 mg Oral Daily Andris Baumann, MD   20 mg at 12/14/22 1024   azithromycin (ZITHROMAX) 500 mg in sodium chloride 0.9 % 250 mL IVPB  500 mg Intravenous Q24H Thompson, Amy C, RPH       Chlorhexidine Gluconate Cloth 2 % PADS 6 each  6 each Topical Daily Leeroy Bock, MD   6 each at 12/12/22 1815   HYDROcodone-acetaminophen (NORCO/VICODIN) 5-325 MG per tablet 1-2 tablet  1-2 tablet Oral Q4H PRN Andris Baumann, MD       lactated ringers bolus 1,000 mL  1,000 mL Intravenous Once Erin Fulling, MD 999 mL/hr at 12/14/22 0600 Infusion Verify at 12/14/22 0600   lactated ringers bolus 1,000 mL  1,000 mL Intravenous Once Erin Fulling, MD 999 mL/hr at 12/14/22 0600 Infusion Verify at 12/14/22 0600   magnesium sulfate IVPB 1 g 100 mL  1 g Intravenous Once Lowella Bandy, RPH       morphine (PF) 2 MG/ML injection 2 mg  2 mg Intravenous Q2H PRN Andris Baumann, MD        norepinephrine (LEVOPHED) 16 mg in (0.064 mg/mL) premix infusion  0-40 mcg/min Intravenous Titrated Harlon Ditty D, NP 5.25 mL/hr at 12/14/22 0908 5.6 mcg/min at 12/14/22 0908   ondansetron (ZOFRAN) tablet 4 mg  4 mg Oral Q6H PRN Andris Baumann, MD       Or   ondansetron [facility]) injection 4 mg  4 mg Intravenous Q6H PRN Andris Baumann, MD   4 mg at 12/12/22 2243   Oral care mouth rinse  15 mL Mouth Rinse PRN Leeroy Bock, MD       pantoprazole (PROTONIX) injection 40 mg  40 mg Intravenous Q12H Erin Fulling, MD   40 mg at 12/14/22 1025   piperacillin-tazobactam (ZOSYN) IVPB 2.25 g  2.25 g Intravenous Q8H Angelique Blonder, RPH   Stopped at 12/14/22 0542   sodium bicarbonate 150 mEq in sterile water 1,150 mL infusion   Intravenous Continuous Harlon Ditty D, NP 50 mL/hr at 12/14/22 0600 Infusion Verify at 12/14/22 0600   sodium bicarbonate tablet 650 mg  650 mg Oral BID Andris Baumann, MD   650 mg at 12/12/22 2231   sodium chloride flush (NS) 0.9 % injection 10 mL  10 mL Intravenous Q12H Willy Eddy, MD   10 mL at 12/14/22 1026   sodium chloride flush (NS) 0.9 % injection 10-40 mL  10-40 mL Intracatheter Q12H Harlon Ditty D, NP   10 mL at 12/14/22 1027   sodium chloride flush (NS) 0.9 % injection 10-40 mL  10-40 mL Intracatheter PRN Judithe Modest, NP       tamsulosin [facility]) capsule 0.4 mg  0.4 mg Oral QPC supper Andris Baumann, MD   0.4 mg at 12/12/22 1843      Allergies: Allergies  Allergen Reactions   Phenothiazines Other (See Comments)    Extra-pyramidal reaction.   Extra-pyramidal reaction   Prochlorperazine Edisylate       Past Medical History: Past Medical History:  Diagnosis Date   A-fib (HCC)    Allergic rhinitis    CAD (coronary artery disease)    Chronic kidney disease    Hypertension      Past Surgical History: Past Surgical History:  Procedure Laterality Date   AORTIC VALVE REPAIR     AV FISTULA  PLACEMENT Left 02/2022   BIOPSY   12/11/2022   Procedure: BIOPSY;  Surgeon: Norma Fredrickson, Boykin Nearing, MD;  Location: [facility] ENDOSCOPY;  Service: Gastroenterology;;   COLONOSCOPY N/A 12/13/2022   Procedure: COLONOSCOPY;  Surgeon: Toledo, Boykin Nearing, MD;  Location: [facility] ENDOSCOPY;  Service: Gastroenterology;  Laterality: N/A;   COLONOSCOPY WITH PROPOFOL N/A 12/11/2022   Procedure: COLONOSCOPY WITH PROPOFOL;  Surgeon: Toledo, Boykin Nearing, MD;  Location: [facility] ENDOSCOPY;  Service: Gastroenterology;  Laterality: N/A;   CORONARY ARTERY BYPASS GRAFT     ESOPHAGOGASTRODUODENOSCOPY (EGD) WITH PROPOFOL N/A 12/11/2022   Procedure: ESOPHAGOGASTRODUODENOSCOPY (EGD) WITH PROPOFOL;  Surgeon: Toledo, Boykin Nearing, MD;  Location: [facility] ENDOSCOPY;  Service: Gastroenterology;  Laterality: N/A;   HEMOSTASIS CLIP PLACEMENT  12/11/2022   Procedure: HEMOSTASIS CLIP PLACEMENT;  Surgeon: Norma Fredrickson, Boykin Nearing, MD;  Location: [facility] ENDOSCOPY;  Service: Gastroenterology;;   POLYPECTOMY  12/11/2022   Procedure: POLYPECTOMY;  Surgeon: Toledo, Boykin Nearing, MD;  Location: [facility] ENDOSCOPY;  Service: Gastroenterology;;     Family History: Family History  Problem Relation Age of Onset   Aneurysm Mother    Heart attack Father    Heart attack Sister    Heart attack Brother      Social History: Social History   Socioeconomic History   Marital status: Married    Spouse name: Not on file   Number of children: Not on file   Years of education: Not on file   Highest education level: Not on file  Occupational History   Not on file  Tobacco Use   Smoking status: Former   Smokeless tobacco: Never  Vaping Use   Vaping status: Never Used  Substance and Sexual Activity   Alcohol use: No   Drug use: No   Sexual activity: Not on file  Other Topics Concern   Not on file  Social History Narrative   Not on file   Social Determinants of Health   Financial Resource Strain: Not on file  Food Insecurity: No Food Insecurity (12/12/2022)   Hunger Vital Sign    Worried About Running  Out of Food in the Last Year: Never true    Ran Out of Food in the Last Year: Never true  Transportation Needs: No Transportation Needs (12/12/2022)   PRAPARE - Administrator, Civil Service (Medical): No    Lack of Transportation (Non-Medical): No  Physical Activity: Not on file  Stress: Not on file  Social Connections: Not on file  Intimate Partner Violence: Not At Risk (12/12/2022)   Humiliation, Afraid, Rape, and Kick questionnaire    Fear of Current or Ex-Partner: No    Emotionally Abused: No    Physically Abused: No    Sexually Abused: No       Vital Signs: Blood pressure (!) 100/48, pulse 93, temperature 98.6 F (37 C), resp. rate (!) 21, height 5\' 9"  (1.753 m), weight 76.2 kg, SpO2 96%.  Weight trends: Filed Weights   12/11/22 2135  Weight: 76.2 kg    Physical Exam: General: NAD,   Head: Normocephalic, atraumatic. Moist oral mucosal membranes  Eyes: Anicteric, PERRL  Neck: Supple, trachea midline  Lungs:  Clear to auscultation  Heart: Regular rate and rhythm  Abdomen:  Soft, nontender,   Extremities:  no peripheral edema.  Neurologic: Nonfocal, moving all four extremities  Skin: No lesions  Access: Left AVF     Lab results: Basic Metabolic Panel: Recent Labs  Lab 12/13/22 0537 12/13/22 1949 12/13/22 2212 12/14/22 0435 12/14/22 1007  NA 145 145 147* 146*  --   K 3.9 6.0* 3.5 3.4*  --   CL 116* 118* 113* 112*  --   CO2 15* 21* 24 24  --   GLUCOSE 127* 191* 146* 127*  --   BUN 62* 48* 57* 57*  --   CREATININE 3.75* 3.15* 3.67* 3.85*  --   CALCIUM 6.7* 6.1* 6.6* 7.1*  --   MG 1.8  --   --   --  1.6*    Liver Function Tests: Recent Labs  Lab 12/13/22 0537 12/13/22 1949  AST 12* 15  ALT 11 8  ALKPHOS 45 33*  BILITOT 1.1 0.9  PROT 3.7* 3.0*  ALBUMIN 2.3* 1.8*   No results for input(s): "LIPASE", "AMYLASE" in the last 168 hours. No results for input(s): "AMMONIA" in the last 168 hours.  CBC: Recent Labs  Lab 12/11/22 2140  12/12/22 0418 12/13/22 0537 12/13/22 1703 12/13/22 1948 12/13/22 1949 12/14/22 0149 12/14/22 0435 12/14/22 0554  WBC 4.7  --  7.8 6.0  --  7.6 12.8* 12.8*  --   NEUTROABS 2.8  --   --   --   --  6.0 10.2*  --   --   HGB 10.6*   < > 8.4* 7.1*   < > 9.2* 7.3* 7.1* 6.9*  HCT 34.7*  --  24.9* 21.1*   < > 27.4* 21.3* 20.7* 20.0*  MCV 99.1  --  91.2 88.3  --  89.3 87.3 85.9  --   PLT 154  --  114* 93*  --  81* 102* 101*  --    < > = values in this interval not displayed.    Cardiac Enzymes: No results for input(s): "CKTOTAL", "CKMB", "CKMBINDEX", "TROPONINI" in the last 168 hours.  BNP: Invalid input(s): "POCBNP"  CBG: Recent Labs  Lab 12/13/22 1931 12/13/22 2308 12/14/22 0326 12/14/22 0747 12/14/22 1138  GLUCAP 101* 133* 117* 116* 197*    Microbiology: Results for orders placed or performed during the hospital encounter of 12/12/22  MRSA Next Gen by PCR, Nasal     Status: None   Collection Time: 12/12/22  5:28 PM   Specimen: Nasal Mucosa; Nasal Swab  Result Value Ref Range Status   MRSA by PCR Next Gen NOT DETECTED NOT DETECTED Final    Comment: (NOTE) The GeneXpert MRSA Assay (FDA approved for NASAL specimens only), is one component of a comprehensive MRSA colonization surveillance program. It is not intended to diagnose MRSA infection nor to guide or monitor treatment for MRSA infections. Test performance is not FDA approved in patients less than 64 years old. Performed at [facility], [address] [place], [place] [address]   Culture, blood (Routine X 2) w Reflex to ID Panel     Status: None (Preliminary result)   Collection Time: 12/13/22  7:42 PM   Specimen: BLOOD  Result Value Ref Range Status   Specimen Description BLOOD BLOOD LEFT ARM  Final   Special Requests   Final    BOTTLES DRAWN AEROBIC AND ANAEROBIC Blood Culture results may not be optimal due to an excessive volume of blood received in culture bottles   Culture   Final    NO GROWTH <  12 HOURS Performed at [facility], [address] [place], [place] [address]    Report Status PENDING  Incomplete  Culture, blood (Routine X 2) w Reflex to ID Panel     Status: None (Preliminary result)  Collection Time: 12/13/22  9:35 PM   Specimen: BLOOD  Result Value Ref Range Status   Specimen Description BLOOD BLOOD RIGHT ARM  Final   Special Requests   Final    BOTTLES DRAWN AEROBIC AND ANAEROBIC Blood Culture adequate volume   Culture   Final    NO GROWTH < 12 HOURS Performed at [facility], [address] [place], [place] [address]    Report Status PENDING  Incomplete    Coagulation Studies: Recent Labs    12/12/22 0142 12/14/22 0149  LABPROT 16.2* 17.0*  INR 1.3* 1.4*    Urinalysis: No results for input(s): "COLORURINE", "LABSPEC", "PHURINE", "GLUCOSEU", "HGBUR", "BILIRUBINUR", "KETONESUR", "PROTEINUR", "UROBILINOGEN", "NITRITE", "LEUKOCYTESUR" in the last 72 hours.  Invalid input(s): "APPERANCEUR"    Imaging: DG Chest 1 View  Result Date: 12/13/2022 CLINICAL DATA:  CHF EXAM: CHEST  1 VIEW COMPARISON:  03/20/2022 FINDINGS: Cardiomegaly. Mediastinal contours within normal limits. Prior median sternotomy. Patchy opacity throughout the left lung. Right lung clear. No visible effusions or pneumothorax. No acute bony abnormality. IMPRESSION: Patchy opacity throughout the left lung concerning for pneumonia. Electronically Signed   By: Charlett Nose M.D.   On: 12/13/2022 19:34     Assessment & Plan: Mr. VIVAN DEGAETANO Sr. is a 82 y.o.  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 Baseline creatinine 5.2 with GFR of 10 on  09/23/22.  - no indication for dialysis at this time. COntinue to monitor volume status, electrolytes and renal function.  - holding losartan and furosemide - Continue IV fluids  Hypotension with volume loss:  - continue vasopressors - Continue IV fluids: sodium bicarbonate  Anemia with acute blood loss, iron deficiency and anemia of chronic kidney disease - status post PRBC transfusion - has not received ESA this admission.      LOS: 2 Tawonna Esquer 12/7/20241:07 PM

## 2022-12-14 NOTE — Progress Notes (Signed)
1224 Had small maroon colored stool.

## 2022-12-15 ENCOUNTER — Inpatient Hospital Stay: Payer: Medicare Other

## 2022-12-15 ENCOUNTER — Encounter: Payer: Self-pay | Admitting: Student in an Organized Health Care Education/Training Program

## 2022-12-15 DIAGNOSIS — K91840 Postprocedural hemorrhage and hematoma of a digestive system organ or structure following a digestive system procedure: Secondary | ICD-10-CM | POA: Diagnosis not present

## 2022-12-15 DIAGNOSIS — R571 Hypovolemic shock: Secondary | ICD-10-CM | POA: Diagnosis not present

## 2022-12-15 LAB — GLUCOSE, CAPILLARY
Glucose-Capillary: 134 mg/dL — ABNORMAL HIGH (ref 70–99)
Glucose-Capillary: 129 mg/dL — ABNORMAL HIGH (ref 70–99)
Glucose-Capillary: 140 mg/dL — ABNORMAL HIGH (ref 70–99)
Glucose-Capillary: 190 mg/dL — ABNORMAL HIGH (ref 70–99)
Glucose-Capillary: 151 mg/dL — ABNORMAL HIGH (ref 70–99)
Glucose-Capillary: 117 mg/dL — ABNORMAL HIGH (ref 70–99)

## 2022-12-15 LAB — CBC
HCT: 21.6 % — ABNORMAL LOW (ref 39.0–52.0)
Hemoglobin: 7.3 g/dL — ABNORMAL LOW (ref 13.0–17.0)
MCH: 29.6 pg (ref 26.0–34.0)
MCHC: 33.8 g/dL (ref 30.0–36.0)
MCV: 87.4 fL (ref 80.0–100.0)
Platelets: 89 10*3/uL — ABNORMAL LOW (ref 150–400)
RBC: 2.47 MIL/uL — ABNORMAL LOW (ref 4.22–5.81)
RDW: 17.5 % — ABNORMAL HIGH (ref 11.5–15.5)
WBC: 8 10*3/uL (ref 4.0–10.5)
nRBC: 0 % (ref 0.0–0.2)

## 2022-12-15 LAB — BASIC METABOLIC PANEL
Anion gap: 10 (ref 5–15)
BUN: 55 mg/dL — ABNORMAL HIGH (ref 8–23)
CO2: 26 mmol/L (ref 22–32)
Calcium: 7.2 mg/dL — ABNORMAL LOW (ref 8.9–10.3)
Chloride: 105 mmol/L (ref 98–111)
Creatinine, Ser: 3.72 mg/dL — ABNORMAL HIGH (ref 0.61–1.24)
GFR, Estimated: 15 mL/min — ABNORMAL LOW (ref >60)
Glucose, Bld: 153 mg/dL — ABNORMAL HIGH (ref 70–99)
Potassium: 4 mmol/L (ref 3.5–5.1)
Sodium: 141 mmol/L (ref 135–145)

## 2022-12-15 LAB — ECHOCARDIOGRAM COMPLETE
AR max vel: 1.75 cm2
AV Area VTI: 1.59 cm2
AV Area mean vel: 1.74 cm2
AV Mean grad: 19 mm[Hg]
AV Peak grad: 41.9 mm[Hg]
Ao pk vel: 3.24 m/s
Area-P 1/2: 2.49 cm2
MV VTI: 2.2 cm2
S' Lateral: 3.1 cm

## 2022-12-15 LAB — PREPARE RBC (CROSSMATCH)

## 2022-12-15 LAB — PROCALCITONIN: Procalcitonin: 1.4 ng/mL

## 2022-12-15 LAB — HEMOGLOBIN AND HEMATOCRIT, BLOOD
HCT: 22.2 % — ABNORMAL LOW (ref 39.0–52.0)
HCT: 22.3 % — ABNORMAL LOW (ref 39.0–52.0)
Hemoglobin: 7.5 g/dL — ABNORMAL LOW (ref 13.0–17.0)
Hemoglobin: 7.7 g/dL — ABNORMAL LOW (ref 13.0–17.0)

## 2022-12-15 LAB — MAGNESIUM: Magnesium: 2.2 mg/dL (ref 1.7–2.4)

## 2022-12-15 LAB — PHOSPHORUS: Phosphorus: 4.2 mg/dL (ref 2.5–4.6)

## 2022-12-15 MED ORDER — IOHEXOL 350 MG/ML SOLN
100.0000 mL | Freq: Once | INTRAVENOUS | Status: AC | PRN
  Administered 2022-12-15: 100 mL via INTRAVENOUS

## 2022-12-15 MED ORDER — SODIUM CHLORIDE 0.9 % IV SOLN
250.0000 mg | Freq: Two times a day (BID) | INTRAVENOUS | Status: DC
  Administered 2022-12-15 – 2022-12-16 (×3): 250 mg via INTRAVENOUS
  Filled 2022-12-15 (×4): qty 2.5

## 2022-12-15 MED ORDER — SODIUM CHLORIDE 0.9% IV SOLUTION: Freq: Once | INTRAVENOUS | Status: DC

## 2022-12-15 MED ORDER — SODIUM BICARBONATE 8.4 % IV SOLN
INTRAVENOUS | Status: DC
  Filled 2022-12-15 (×2): qty 150
  Filled 2022-12-15: qty 1000

## 2022-12-15 MED ORDER — LEVETIRACETAM IN NACL 1000 MG/100ML IV SOLN
1000.0000 mg | Freq: Once | INTRAVENOUS | Status: AC
  Administered 2022-12-15: 1000 mg via INTRAVENOUS
  Filled 2022-12-15: qty 100

## 2022-12-15 NOTE — Progress Notes (Signed)
[facility]  ROUNDING NOTE   Subjective:   Overnight events reviewed. Altered mental status and code stoke called.   Objective:  Vital signs in last 24 hours:  Temp:  [97.8 F (36.6 C)-98.7 F (37.1 C)] 98.7 F (37.1 C) (12/08 1049) Pulse Rate:  [63-104] 78 (12/08 1049) Resp:  [13-29] 24 (12/08 1049) BP: (75-137)/(47-80) 101/68 (12/08 1049) SpO2:  [84 %-100 %] 99 % (12/08 0730)  Weight change:  Filed Weights   12/11/22 2135  Weight: 76.2 kg    Intake/Output: I/O last 3 completed shifts: In: 58231.6 [I.V.:2198.7; Blood:1153; IV Piggyback:54879.9] Out: 700 [Urine:700]   Intake/Output this shift:  Total I/O In: 829 [P.O.:240; Blood:589] Out: -   Physical Exam: General: NAD,   Head: Normocephalic, atraumatic. Moist oral mucosal membranes  Eyes: Anicteric, PERRL  Neck: Supple, trachea midline  Lungs:  Clear to auscultation  Heart: Regular rate and rhythm  Abdomen:  Soft, nontender,   Extremities:  no peripheral edema.  Neurologic: Alert and oriented x 4  Skin: No lesions  Access: Left AVF + thrill and +bruit    Basic Metabolic Panel: Recent Labs  Lab 12/13/22 0537 12/13/22 1949 12/13/22 2212 12/14/22 0435 12/14/22 1007 12/14/22 2044 12/15/22 0341  NA 145 145 147* 146*  --   --  141  K 3.9 6.0* 3.5 3.4*  --   --  4.0  CL 116* 118* 113* 112*  --   --  105  CO2 15* 21* 24 24  --   --  26  GLUCOSE 127* 191* 146* 127*  --   --  153*  BUN 62* 48* 57* 57*  --   --  55*  CREATININE 3.75* 3.15* 3.67* 3.85*  --   --  3.72*  CALCIUM 6.7* 6.1* 6.6* 7.1*  --   --  7.2*  MG 1.8  --   --   --  1.6*  --  2.2  PHOS  --   --   --   --   --  3.9 4.2    Liver Function Tests: Recent Labs  Lab 12/13/22 0537 12/13/22 1949  AST 12* 15  ALT 11 8  ALKPHOS 45 33*  BILITOT 1.1 0.9  PROT 3.7* 3.0*  ALBUMIN 2.3* 1.8*   No results for input(s): "LIPASE", "AMYLASE" in the last 168 hours. No results for input(s): "AMMONIA" in the last 168  hours.  CBC: Recent Labs  Lab 12/11/22 2140 12/12/22 0418 12/13/22 1703 12/13/22 1948 12/13/22 1949 12/14/22 0149 12/14/22 0435 12/14/22 0554 12/14/22 1337 12/14/22 2005 12/15/22 0151 12/15/22 0341  WBC 4.7   < > 6.0  --  7.6 12.8* 12.8*  --   --   --   --  8.0  NEUTROABS 2.8  --   --   --  6.0 10.2*  --   --   --   --   --   --   HGB 10.6*   < > 7.1*   < > 9.2* 7.3* 7.1* 6.9* 7.5* 7.6* 7.5* 7.3*  HCT 34.7*   < > 21.1*   < > 27.4* 21.3* 20.7* 20.0* 22.2* 22.8* 22.2* 21.6*  MCV 99.1   < > 88.3  --  89.3 87.3 85.9  --   --   --   --  87.4  PLT 154   < > 93*  --  81* 102* 101*  --   --   --   --  89*   < > =  values in this interval not displayed.    Cardiac Enzymes: No results for input(s): "CKTOTAL", "CKMB", "CKMBINDEX", "TROPONINI" in the last 168 hours.  BNP: Invalid input(s): "POCBNP"  CBG: Recent Labs  Lab 12/14/22 1950 12/14/22 2315 12/15/22 0335 12/15/22 0637 12/15/22 0748  GLUCAP 218* 135* 134* 129* 140*    Microbiology: Results for orders placed or performed during the hospital encounter of 12/12/22  MRSA Next Gen by PCR, Nasal     Status: None   Collection Time: 12/12/22  5:28 PM   Specimen: Nasal Mucosa; Nasal Swab  Result Value Ref Range Status   MRSA by PCR Next Gen NOT DETECTED NOT DETECTED Final    Comment: (NOTE) The GeneXpert MRSA Assay (FDA approved for NASAL specimens only), is one component of a comprehensive MRSA colonization surveillance program. It is not intended to diagnose MRSA infection nor to guide or monitor treatment for MRSA infections. Test performance is not FDA approved in patients less than 29 years old. Performed at [facility], [address] [place], [place] [address]   Culture, blood (Routine X 2) w Reflex to ID Panel     Status: None (Preliminary result)   Collection Time: 12/13/22  7:42 PM   Specimen: BLOOD  Result Value Ref Range Status   Specimen Description BLOOD BLOOD LEFT ARM  Final   Special  Requests   Final    BOTTLES DRAWN AEROBIC AND ANAEROBIC Blood Culture results may not be optimal due to an excessive volume of blood received in culture bottles   Culture   Final    NO GROWTH 2 DAYS Performed at [facility], [address], [place], [place] [address]    Report Status PENDING  Incomplete  Culture, blood (Routine X 2) w Reflex to ID Panel     Status: None (Preliminary result)   Collection Time: 12/13/22  9:35 PM   Specimen: BLOOD  Result Value Ref Range Status   Specimen Description BLOOD BLOOD RIGHT ARM  Final   Special Requests   Final    BOTTLES DRAWN AEROBIC AND ANAEROBIC Blood Culture adequate volume   Culture   Final    NO GROWTH 2 DAYS Performed at [facility], [address] [place], [place] [address]    Report Status PENDING  Incomplete    Coagulation Studies: Recent Labs    12/14/22 0149  LABPROT 17.0*  INR 1.4*    Urinalysis: No results for input(s): "COLORURINE", "LABSPEC", "PHURINE", "GLUCOSEU", "HGBUR", "BILIRUBINUR", "KETONESUR", "PROTEINUR", "UROBILINOGEN", "NITRITE", "LEUKOCYTESUR" in the last 72 hours.  Invalid input(s): "APPERANCEUR"    Imaging: CT ANGIO HEAD NECK W WO CM W PERF (CODE STROKE)  Addendum Date: 12/15/2022   ADDENDUM REPORT: 12/15/2022 07:46 ADDENDUM: Study discussed by telephone with Dr. Jalene Mullet on 12/15/2022 at 0727 hours. Electronically Signed   By: Odessa Fleming M.D.   On: 12/15/2022 07:46   Result Date: 12/15/2022 CLINICAL DATA:  82 year old male code stroke presentation. Reportedly right side deficits. EXAM: CT ANGIOGRAPHY HEAD AND NECK CT PERFUSION BRAIN TECHNIQUE: Multidetector CT imaging of the head and neck was performed using the standard protocol during bolus administration of intravenous contrast. Multiplanar CT image reconstructions and MIPs were obtained to evaluate the vascular anatomy. Carotid stenosis measurements (when applicable) are obtained utilizing NASCET criteria, using the distal internal  carotid diameter as the denominator. Multiphase CT imaging of the brain was performed following IV bolus contrast injection. Subsequent parametric perfusion maps were calculated using RAPID software. RADIATION DOSE REDUCTION: This exam  was performed according to the departmental dose-optimization program which includes automated exposure control, adjustment of the mA and/or kV according to patient size and/or use of iterative reconstruction technique. CONTRAST:  OMNIPAQUE IOHEXOL 350 MG/ML SOLN COMPARISON:  None Available. Head CT at the same time reported separately. FINDINGS: CT Brain Perfusion Findings: ASPECTS: 10 CBF (<30%) Volume: 0mL Perfusion (Tmax>6.0s) volume: 0mL No CT Perfusion parameter abnormalities detected. Mismatch Volume: Not applicable Infarction Location:Not applicable CTA NECK Skeleton: Previous sternotomy. Mild for age cervical spine degeneration. No acute osseous abnormality identified. Upper chest: Layering pleural effusions, at least moderate and greater on the left. Additional patchy peribronchial opacity in the left upper lobe is confluent and nonspecific. No superior mediastinal lymphadenopathy. Furthermore, Difficult to exclude a left upper lobe pulmonary embolus on series 7, image 360. No visible saddle embolus. Other neck: No acute finding. Aortic arch: Calcified aortic atherosclerosis. 3 vessel arch. Right carotid system: Brachiocephalic artery is tortuous. Tortuous right CCA origin but no plaque or stenosis. Calcified plaque at the right ICA origin with less than 50 % stenosis with respect to the distal vessel. Left carotid system: Tortuous left CCA at the level of the thyroid but no CCA plaque or stenosis. Minimal calcified plaque at the left carotid bifurcation without stenosis. Vertebral arteries: Tortuous proximal right subclavian artery with mild calcified plaque at the origin. Right vertebral artery origin is normal. Non dominant right vertebral artery is patent and  normal to the skull base. Proximal left subclavian origin minimal plaque and mild tortuosity. Calcified plaque adjacent to the left vertebral artery origin but no significant stenosis on series 7, image 275. Tortuous left V1 segment with additional soft and calcified plaque but no significant stenosis. Dominant left vertebral artery otherwise patent and normal to the skull base. CTA HEAD Posterior circulation: Non dominant right vertebral artery terminates in PICA. Right V4 segment supplies the basilar. There is mild to moderate right V4 circumferential calcified atherosclerosis on series 7, image 144 but no significant stenosis. Patent basilar artery without stenosis. Patent SCA and PCA origins, normal aside from mild basilar tip ectasia. Posterior communicating arteries are diminutive or absent. Left PCA branches are within normal limits. Right PCA branches are within normal limits. Anterior circulation: Both ICA siphons are patent. Left siphon calcified plaque is mild until the supraclinoid segment where moderate calcified plaque and moderate to severe stenosis are noted on series 7, image 109, series 109, image 160. Patent left ICA terminus is mildly ectatic. No discrete terminus aneurysm. Contralateral right ICA siphon moderate calcified plaque but only mild right siphon stenosis. Patent carotid terminus. Patent MCA and ACA origins. Dominant right ACA A1. Normal anterior communicating artery. Bilateral ACA branches are within normal limits. Right MCA M1 segment and bifurcation are patent without stenosis. Left MCA M1 segment and bifurcation are patent without stenosis. Left MCA branches appear patent and within normal limits. Right MCA branches are within normal limits. Venous sinuses: Patent. Anatomic variants: Dominant left vertebral artery supplies the basilar, diminutive right vertebral terminates in PICA. Dominant right ACA 81. Review of the MIP images confirms the above findings IMPRESSION: 1. Negative CT  Perfusion. CTA is negative for large vessel occlusion. 2. Generalized arterial tortuosity. Mildly ectatic basilar artery tip, left ICA terminus. Atherosclerosis is most pronounced at the ICA siphons, and on the left there is moderate to severe supraclinoid Left ICA stenosis due to calcified plaque. No other hemodynamically significant stenosis. 3. Difficult to exclude a left upper lobe pulmonary embolus on series 7, image  360. No visible saddle embolus. But partially visible layering pleural effusions, at least moderate and greater on the left, and with superimposed nonspecific Left upper lobe peribronchial opacity. Consider infection, aspiration, tumor. 4.  Aortic Atherosclerosis (ICD10-I70.0). Electronically Signed: By: Odessa Fleming M.D. On: 12/15/2022 07:26   CT HEAD CODE STROKE WO CONTRAST  Addendum Date: 12/15/2022   ADDENDUM REPORT: 12/15/2022 07:29 ADDENDUM: Study discussed by telephone with Dr. Jalene Mullet in 12/15/2022 at 0710 hours. Electronically Signed   By: Odessa Fleming M.D.   On: 12/15/2022 07:29   Result Date: 12/15/2022 CLINICAL DATA:  Code stroke. 82 year old male last known well at 0400 hours with left gaze, right weakness. EXAM: CT HEAD WITHOUT CONTRAST TECHNIQUE: Contiguous axial images were obtained from the base of the skull through the vertex without intravenous contrast. RADIATION DOSE REDUCTION: This exam was performed according to the departmental dose-optimization program which includes automated exposure control, adjustment of the mA and/or kV according to patient size and/or use of iterative reconstruction technique. COMPARISON:  Head CT 11/04/2010. FINDINGS: Brain: Cerebral volume is within normal limits for age. No acute intracranial hemorrhage identified. No midline shift, mass effect, or evidence of intracranial mass lesion. No ventriculomegaly. No cortically based acute infarct identified. And gray-white differentiation is frequently normal for age throughout the brain. Mild to moderate right  superior frontal lobe white matter hypodensity. Only mild left hemisphere white matter changes. Vascular: Extensive Calcified atherosclerosis at the skull base. Tortuosity. No suspicious intracranial vascular hyperdensity. Skull: Intact.  No acute osseous abnormality identified. Sinuses/Orbits: Visualized paranasal sinuses and mastoids are stable and well aerated. Other: No gaze deviation at this time. Negative visible scalp soft tissues. ASPECTS [facility] Stroke Program Early CT Score) Total score (0-10 with 10 being normal): 10 IMPRESSION: 1. No intracranial hemorrhage and largely normal for age noncontrast CT appearance of the brain. ASPECTS 10. 2. Intracranial artery tortuosity and calcified atherosclerosis. Electronically Signed: By: Odessa Fleming M.D. On: 12/15/2022 07:08   DG Chest 1 View  Result Date: 12/13/2022 CLINICAL DATA:  CHF EXAM: CHEST  1 VIEW COMPARISON:  03/20/2022 FINDINGS: Cardiomegaly. Mediastinal contours within normal limits. Prior median sternotomy. Patchy opacity throughout the left lung. Right lung clear. No visible effusions or pneumothorax. No acute bony abnormality. IMPRESSION: Patchy opacity throughout the left lung concerning for pneumonia. Electronically Signed   By: Charlett Nose M.D.   On: 12/13/2022 19:34     Medications:    azithromycin Stopped (12/14/22 2139)   levETIRAcetam     Followed by   levETIRAcetam     norepinephrine (LEVOPHED) Adult infusion 1 mcg/min (12/15/22 0848)   piperacillin-tazobactam (ZOSYN)  IV 2.25 g (12/15/22 0857)   sodium bicarbonate 150 mEq in sterile water 1,150 mL infusion 50 mL/hr at 12/15/22 0700    sodium chloride   Intravenous Once   atorvastatin  20 mg Oral Daily   Chlorhexidine Gluconate Cloth  6 each Topical Daily   midodrine  10 mg Oral Q8H   pantoprazole (PROTONIX) IV  40 mg Intravenous Q12H   sodium bicarbonate  650 mg Oral BID   sodium chloride flush  10 mL Intravenous Q12H   sodium chloride flush  10-40 mL Intracatheter Q12H    tamsulosin  0.4 mg Oral QPC supper   acetaminophen **OR** acetaminophen, HYDROcodone-acetaminophen, morphine injection, ondansetron **OR** ondansetron (ZOFRAN) IV, mouth rinse, sodium chloride flush  Assessment/ Plan:  Mr. RAMESSES SOUTHARDS Sr. is a 82 y.o.  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 Baseline creatinine 5.2 with GFR of 10 on 09/23/22.  - no indication for dialysis at this time. Continue to monitor volume status, electrolytes and renal function.  - holding losartan and furosemide - Continue IV fluids.  - discontinue PO sodium bicarbonate   Hypotension with volume loss:  - continue vasopressors: norepinephrine - Continue IV fluids: sodium bicarbonate - PO midodrine.    Anemia with acute blood loss, iron deficiency and anemia of chronic kidney disease - status post PRBC transfusions - has not received ESA this admission.    LOS: 3 Jonessa Triplett 12/8/202411:04 AM

## 2022-12-15 NOTE — Consult Note (Signed)
[facility] Consult Services   Patient Name:   Nestor, Marano Date of Birth:   1940-07-11 Identification Number:   MRN - [id number] Date of Service:   12/15/2022 07:01:27  Diagnosis: I63.89 - Cerebrovascular accident (CVA) due to other mechanism (HCCC) G93.41 - Encephalopathy Metabolic  Impression: Pt is a 62 YOM with PMH of HTN, HLD, CAD s/p CABG, ascending aortic dissection s/p repair 1974, bioprosthetic aortic valve, SVT, A.FIB on Eliquis (last dose 12/2) who was noted to have left gaze, right sided weakness. NIHSS: 1. He is not a thrombolytic candidate. Prelim CTAs negative for LVO/perfusion abnormality. Complete workup for stroke vs PE vs toxic/metabolic/infectious process.    Monitor neuro checks/VS q4h with telemetry.  Recommend fall precautions and seizure precautions.  Goal SBP b/w 100-140 avoid hypotension.  Can consider AC once safe from GI stand point.  Cont. STATIN if no contraindications.  Get MRI BRAIN W/O and ECHO.  Get AMMONIA, BLOOD CX x2, COVID, ESR/CRP, TROP, CK, TSH, B12, BNP, LACTIC ACID, LIPID PANEL, and A1C.  Get WORKUP for TOXIC/METABOLIC/INFECTIOUS causes.  PT/OT/ST eval.    Our recommendations are outlined below.  Recommendations:        Stroke/Telemetry Floor       Neuro Checks       Bedside Swallow Eval       DVT Prophylaxis       IV Fluids, Normal Saline       Head of Bed 30 Degrees       Euglycemia and Avoid Hyperthermia (PRN Acetaminophen)  Sign Out:       Discussed with Primary Attending    ------------------------------------------------------------------------------  Advanced Imaging: CTA Head and Neck Completed. CTP Completed. LVO:No  Patient in not a candidate for NIR   Metrics: Last Known Well: 12/15/2022 04:00:00 Dispatch Time: 12/15/2022 07:01:27 Initial Response Time: 12/15/2022 07:05:34 Symptoms: left gaze, rigth sided weakness. Initial patient interaction: 12/15/2022  07:24:22 NIHSS Assessment Completed: 12/15/2022 07:30:21 Patient is not a candidate for Thrombolytic. Thrombolytic Medical Decision: 12/15/2022 07:31:00 Patient was not deemed candidate for Thrombolytic because of following reasons: Recent gastrointestinal or urinary tract hemorrhage (within previous 21 days) .  CT HEAD: 1. No intracranial hemorrhage and largely normal for age noncontrast CT appearance of the brain. ASPECTS 10. 2. Intracranial artery tortuosity and calcified atherosclerosis.  CTA/CTP: 1. Negative CT Perfusion. CTA is negative for large vessel occlusion. 2. Generalized arterial tortuosity. Mildly ectatic basilar artery tip, left ICA terminus. Atherosclerosis is most pronounced at the ICA siphons, and on the left there is moderate to severe supraclinoid Left ICA stenosis due to calcified plaque. No other hemodynamically significant stenosis. 3. Difficult to exclude a left upper lobe pulmonary embolus on series 7, image 360. No visible saddle embolus. But partially visible layering pleural effusions, at least moderate and greater on the left, and with superimposed nonspecific Left upper lobe peribronchial opacity. Consider infection, aspiration, tumor. 4. Aortic Atherosclerosis (ICD10-I70.0).     Primary Provider Notified of Diagnostic Impression and Management Plan on: 12/15/2022 07:39:00 Spoke With: Dr. Belia Heman (left VM) Able to Reach 12/15/2022 07:39:00    ------------------------------------------------------------------------------  History of Present Illness: Patient is a 82 year old Male.  Inpatient stroke alert was called for symptoms of left gaze, rigth sided weakness. Pt is a 10 YOM with PMH of HTN, HLD, CAD s/p CABG, ascending aortic dissection s/p repair 1974, bioprosthetic aortic valve, SVT, A.FIB on Eliquis (last dose 12/2) who was noted to have left gaze, right sided weakness. They reported he was  last seen well at 0400 and symptoms noted at 0615. He's  being managed for GI bleed and off AC currently.   Past Medical History:      Hypertension      Hyperlipidemia      Atrial Fibrillation      Coronary Artery Disease  Medications:  Anticoagulant use:  Yes ELIQUIS previously No Antiplatelet use Reviewed EMR for current medications  Allergies:  Reviewed  Social History: Smoking: Former Alcohol Use: No Drug Use: No  Family History:  There is no family history of premature cerebrovascular disease pertinent to this consultation  ROS : 14 Points Review of Systems was performed and was negative except mentioned in HPI.  Past Surgical History: There Is No Surgical History Contributory To Today's Visit    Examination: BP(115/71), Pulse(68), Blood Glucose(135) 1A: Level of Consciousness - Alert; keenly responsive + 0 1B: Ask Month and Age - Both Questions Right + 0 1C: Blink Eyes & Squeeze Hands - Performs Both Tasks + 0 2: Test Horizontal Extraocular Movements - Normal + 0 3: Test Visual Fields - No Visual Loss + 0 4: Test Facial Palsy (Use Grimace if Obtunded) - Normal symmetry + 0 5A: Test Left Arm Motor Drift - No Drift for 10 Seconds + 0 5B: Test Right Arm Motor Drift - No Drift for 10 Seconds + 0 6A: Test Left Leg Motor Drift - No Drift for 5 Seconds + 0 6B: Test Right Leg Motor Drift - No Drift for 5 Seconds + 0 7: Test Limb Ataxia (FNF/Heel-Shin) - No Ataxia + 0 8: Test Sensation - Normal; No sensory loss + 0 9: Test Language/Aphasia - Normal; No aphasia + 0 10: Test Dysarthria - Mild-Moderate Dysarthria: Slurring but can be understood + 1 11: Test Extinction/Inattention - No abnormality + 0  NIHSS Score: 1  NIHSS Free Text : He was drowsy, and slight slurring of speech intermittently, but no facial droop or gaze deviation.  Pre-Morbid Modified Rankin Scale: 0 Points = No symptoms at all   Spoke with : Dr. Belia Heman (left VM)  This consult was conducted in real time using interactive audio and Immunologist.  Patient was informed of the technology being used for this visit and agreed to proceed. Patient located in hospital and provider located at home/office setting.   Patient is being evaluated for possible acute neurologic impairment and high probability of imminent or life-threatening deterioration. I spent total of 35 minutes providing care to this patient, including time for face to face visit via telemedicine, review of medical records, imaging studies and discussion of findings with providers, the patient and/or family.   Dr Marcene Corning   [facility] For Inpatient follow-up with [facility] physician please call [facility] at [phone]. As we are not an outpatient service for any post hospital discharge needs please contact the hospital for assistance. If you have any questions for the [facility] physicians or need to reconsult for clinical or diagnostic changes please contact us via [facility] at [phone].

## 2022-12-15 NOTE — Progress Notes (Signed)
NAME:  Terry BIESCHKE Sr., MRN:  [id number], DOB:  08-Sep-1940, LOS: 3 ADMISSION DATE:  12/12/2022, CONSULTATION DATE:  12/13/2022 REFERRING MD:  Manuela Schwartz, NP, CHIEF COMPLAINT:  Hypotension   Brief Pt Description / Synopsis:  82 y.o. 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  Course complicated by shock and concern for developing pneumonia.  History of Present Illness:  Terry DUDAK Sr. is a 82 y.o. male with medical history significant for CKD5, HTN, A-fib on Eliquis (last dose 12/2), CAD s/p CABG, ascending aortic dissection s/p repair 1974, bioprosthetic aortic valve, SVT on high-dose metoprolol succinate, history of syncope with prior loop recorder who presented to [facility] ED on 12/11/22 due to complaints of rectal bleeding that started several hours following Colonoscopy and polypectomy.  He underwent elective EGD and Colonoscopy on 12/11/22 for workup of iron deficiency anemia, which required snare polypectomy of 15 polyps with placement of multiple hemostatic clips.  He reported that several hours after the procedure he felt like he was oozing blood and began passing dark clots. He endorsed mild lightheadedness but denies chest pain, shortness of breath or palpitations,  but denied abdominal pain.  Of note, patient stopped taking Eliquis since 12/20 preparation for his colonoscopy and has not yet resumed.  He was however taking a baby aspirin as advised by his cardiologist who did preprocedural cardiac clearance.   ED Course: Initial Vital Signs: Temperature 97.5 F, RR 18, BP 160/76 and pulse 67, SpO2 100% Significant Labs:  hemoglobin 10.6.  Baseline of 9. Renal function at baseline with creatinine 4.3,  bicarb 16  Hospitalists were asked to admit for further workup and treatment.  GI was consulted.  Please see "Significant Hospital Events" section below for full detailed hospital course.   Pertinent  Medical History   Past Medical History:  Diagnosis Date   A-fib (HCC)    Allergic rhinitis    CAD (coronary artery disease)    Chronic kidney disease    Hypertension      Micro Data:  12/5: MRSA PCR>>negative 12/6: Blood culture x2>> 12/6: Sputum>> 12/6: Strep pneumo & Legionella urinary antigens>>  Antimicrobials:   Anti-infectives (From admission, onward)    Start     Dose/Rate Route Frequency Ordered Stop   12/14/22 2030  azithromycin (ZITHROMAX) 500 mg in sodium chloride 0.9 % 250 mL IVPB        500 mg 250 mL/hr over 60 Minutes Intravenous Every 24 hours 12/14/22 1207 12/18/22 2029   12/13/22 2200  piperacillin-tazobactam (ZOSYN) IVPB 2.25 g        2.25 g 100 mL/hr over 30 Minutes Intravenous Every 8 hours 12/13/22 2107     12/13/22 2100  azithromycin (ZITHROMAX) 500 mg in dextrose 5 % 250 mL IVPB  Status:  Discontinued        500 mg 255 mL/hr over 60 Minutes Intravenous Every 24 hours 12/13/22 1955 12/14/22 1207   12/13/22 2030  cefTRIAXone (ROCEPHIN) 1 g in sodium chloride 0.9 % 100 mL IVPB  Status:  Discontinued        1 g 200 mL/hr over 30 Minutes Intravenous Every 24 hours 12/13/22 1955 12/13/22 2036        Significant Hospital Events: Including procedures, antibiotic  start and stop dates in addition to other pertinent events   12/4: Underwent elective EGD and Colostomy for evaluation of IDA, with polypectomy.  Went home post procedure, however in the following hours at home, passing bright red blood and clots from rectum.  Went to ED for evaluation. 12/5: [facility] admitted. GI consulted. Received enema for repeat colonoscopy with loss of large amount of bright red blood per rectum with associated hypotension.  Was given fluids and 2 units blood, required low dose  peripheral Levophed 12/6: Repeat Colonoscopy performed.  Late in the evening, again Hypotensive, given 1 unit of blood and placed on Levophed. PCCM consulted,  Central line placed. 12/7 severe hypovolumic shock        Interim History / Subjective: Stroke Code: THIS AM   Patient LKW at 0400 with full exam per bedside RN At 0615 he was pulling off tele monitor and yelling to urinate (not noted if patient was moving equally) APP called @ [phone] with acute onset unresponsiveness non verbal, not following commands, not moving extremities, midline gaze with blank stare, no blink to threat. About 2 minutes later noted left gaze deviation. No response to noxious stimulation on right upper or lower extremity. Brisk withdrawal to left upper and lower.   Stroke code called Patient taken to CT scanner for imaging. Tele Neurology attending on tele monitor and evaluated patient and imaging. Per radiology and neuro no ICH or LVO.   Shortly after arrival back to the ICU within 5 minutes he is now with verbal output, however, garbled speech, and emotional. Neurology will be consulted now, and family will be updated.    See Tele Neurology note for further details.   + hypovolumic shock +ARF Restarted on pressors   Objective   Blood pressure 120/71, pulse 93, temperature 98.4 F (36.9 C), temperature source Oral, resp. rate (!) 24, height 5\' 9"  (1.753 m), weight 76.2 kg, SpO2 99%.        Intake/Output Summary (Last 24 hours) at 12/15/2022 0836 Last data filed at 12/15/2022 0700 Gross per 24 hour  Intake 28613.72 ml  Output 200 ml  Net 28413.72 ml   Filed Weights   12/11/22 2135  Weight: 76.2 kg      Review of Systems: Garbled speech Increased WOB Other:  All other systems negative   Physical Examination:   General Appearance: + distress  EYES PERRLA, EOM intact.   NECK Supple, No JVD Pulmonary: normal breath sounds, +rhonchi   CardiovascularNormal S1,S2.  No m/r/g.   Abdomen:  Benign, Soft, non-tender. Neurology UE/LE 5/5 strength, no focal deficits Ext pulses intact, cap refill intact ALL OTHER ROS ARE NEGATIVE      Assessment & Plan:   82 yo white male with acute severe hypovolumic shock ?pneumonia with underlying afib with RVR with chronic HFrEF SVT, CAD s/p CABG, Aortic insufficiency s/p AVR (bioprosthetic) in 2012, HLD s/o CODE STROKE     ABLA ANEMIA AND HYPOVOLUMIC shock SOURCE-GIB Hematochezia due to acute Lower GI Bleed, post-procedural bleeding following Colonoscopy with Polypectomy on 12/11/22 Thrombocytopenia -use vasopressors to keep MAP>65 as needed -follow ABG and LA as needed -follow up cultures -emperic ABX     CARDIAC Echocardiogram 02/2022: LVEF 40%, enlarged RV size and reduced RV function, normal function bioprosthetic aortic valve, mild MR -Continuous cardiac monitoring -Maintain MAP >65 -Cautious IV fluids -Transfusions as indicated -Vasopressors as needed to maintain MAP goal -Trend lactic acid until normalized -Trend HS Troponin until peaked -Repeat Echocardiogram pending -Hold home antihypertensives (  metoprolol and losartan) -Holding Eliquis due to GI bleed ~ will defer to GI and Cardiology on when to resume [facility]    GIB S/p repeat Colonoscopy on 12/6 -GI following, appreciate input -Monitor for S/Sx of bleeding -Trend CBC (H&H q6h) -SCD's for VTE Prophylaxis (chemical ppx contraindicated) -Transfuse for Hgb <8 ~ transfusing 1 unit pRBC's and 1 FFP on 12/6 -Transfuse platelets for platelet count <10 K or for count <50 K with active bleeding -Continue Protonix 40 mg BID  Metabolic Acidosis in setting of lactic acidosis and AKI AKI on CKD Stage V -continue Foley Catheter-assess need -Avoid nephrotoxic agents -Follow urine output, BMP -Ensure adequate renal perfusion, optimize oxygenation -Renal dose medications   Intake/Output Summary (Last 24 hours) at 12/15/2022 0840 Last data filed at 12/15/2022 0700 Gross  per 24 hour  Intake 28613.72 ml  Output 200 ml  Net 28413.72 ml    NEUROLOGY ACUTE METABOLIC ENCEPHALOPATHY CODE STROKE not a candidate for TPA CT scans are Non diagnostic Neurology consultation  ?seizures  Pt is critically ill, prognosis is extremely guarded. High risk for further decompensation, cardiac arrest and death.  Given current critical illness superimposed on multiple chronic co morbidities and advanced age, would recommend DNR/DNI status.   Best Practice (right click and "Reselect all SmartList Selections" daily)   Diet/type: NPO DVT prophylaxis: SCD (chemical ppx contraindicated) GI prophylaxis: PPI Lines: Central line Foley:  Yes, and it is still needed Code Status:  full code Last date of multidisciplinary goals of care discussion [N/A]  12/6: Pt updated at bedside, pt's wife updated via telephone by [facility] NP.  Labs   CBC: Recent Labs  Lab 12/11/22 2140 12/12/22 0418 12/13/22 1703 12/13/22 1948 12/13/22 1949 12/14/22 0149 12/14/22 0435 12/14/22 0554 12/14/22 1337 12/14/22 2005 12/15/22 0151 12/15/22 0341  WBC 4.7   < > 6.0  --  7.6 12.8* 12.8*  --   --   --   --  8.0  NEUTROABS 2.8  --   --   --  6.0 10.2*  --   --   --   --   --   --   HGB 10.6*   < > 7.1*   < > 9.2* 7.3* 7.1* 6.9* 7.5* 7.6* 7.5* 7.3*  HCT 34.7*   < > 21.1*   < > 27.4* 21.3* 20.7* 20.0* 22.2* 22.8* 22.2* 21.6*  MCV 99.1   < > 88.3  --  89.3 87.3 85.9  --   --   --   --  87.4  PLT 154   < > 93*  --  81* 102* 101*  --   --   --   --  89*   < > = values in this interval not displayed.    Basic Metabolic Panel: Recent Labs  Lab 12/13/22 0537 12/13/22 1949 12/13/22 2212 12/14/22 0435 12/14/22 1007 12/14/22 2044 12/15/22 0341  NA 145 145 147* 146*  --   --  141  K 3.9 6.0* 3.5 3.4*  --   --  4.0  CL 116* 118* 113* 112*  --   --  105  CO2 15* 21* 24 24  --   --  26  GLUCOSE 127* 191* 146* 127*  --   --  153*  BUN 62* 48* 57* 57*  --   --  55*  CREATININE 3.75* 3.15* 3.67*  3.85*  --   --  3.72*  CALCIUM 6.7* 6.1* 6.6* 7.1*  --   --  7.2*  MG 1.8  --   --   --  1.6*  --  2.2  PHOS  --   --   --   --   --  3.9 4.2   GFR: Estimated Creatinine Clearance: 15 mL/min (A) (by C-G formula based on SCr of 3.72 mg/dL (H)). Recent Labs  Lab 12/13/22 1942 12/13/22 1949 12/13/22 2212 12/14/22 0149 12/14/22 0435 12/14/22 1007 12/15/22 0341  PROCALCITON  --  0.96  --   --  2.16  --  1.40  WBC  --  7.6  --  12.8* 12.8*  --  8.0  LATICACIDVEN 5.3*  --  1.8  --   --  1.3  --     Liver Function Tests: Recent Labs  Lab 12/13/22 0537 12/13/22 1949  AST 12* 15  ALT 11 8  ALKPHOS 45 33*  BILITOT 1.1 0.9  PROT 3.7* 3.0*  ALBUMIN 2.3* 1.8*   No results for input(s): "LIPASE", "AMYLASE" in the last 168 hours. No results for input(s): "AMMONIA" in the last 168 hours.  ABG    Component Value Date/Time   HCO3 14.2 (L) 12/13/2022 1951   ACIDBASEDEF 17.6 (H) 12/13/2022 1951   O2SAT 52.3 12/13/2022 1951     Coagulation Profile: Recent Labs  Lab 12/12/22 0142 12/14/22 0149  INR 1.3* 1.4*    Cardiac Enzymes: No results for input(s): "CKTOTAL", "CKMB", "CKMBINDEX", "TROPONINI" in the last 168 hours.  HbA1C: No results found for: "HGBA1C"  CBG: Recent Labs  Lab 12/14/22 1950 12/14/22 2315 12/15/22 0335 12/15/22 0637 12/15/22 0748  GLUCAP 218* 135* 134* 129* 140*    Past Medical History:  He,  has a past medical history of A-fib (HCC), Allergic rhinitis, CAD (coronary artery disease), Chronic kidney disease, and Hypertension.   Surgical History:   Past Surgical History:  Procedure Laterality Date   AORTIC VALVE REPAIR     AV FISTULA PLACEMENT Left 02/2022   BIOPSY  12/11/2022   Procedure: BIOPSY;  Surgeon: Norma Fredrickson, Boykin Nearing, MD;  Location: [facility] ENDOSCOPY;  Service: Gastroenterology;;   COLONOSCOPY N/A 12/13/2022   Procedure: COLONOSCOPY;  Surgeon: Toledo, Boykin Nearing, MD;  Location: [facility] ENDOSCOPY;  Service: Gastroenterology;  Laterality: N/A;    COLONOSCOPY WITH PROPOFOL N/A 12/11/2022   Procedure: COLONOSCOPY WITH PROPOFOL;  Surgeon: Toledo, Boykin Nearing, MD;  Location: [facility] ENDOSCOPY;  Service: Gastroenterology;  Laterality: N/A;   CORONARY ARTERY BYPASS GRAFT     ESOPHAGOGASTRODUODENOSCOPY (EGD) WITH PROPOFOL N/A 12/11/2022   Procedure: ESOPHAGOGASTRODUODENOSCOPY (EGD) WITH PROPOFOL;  Surgeon: Toledo, Boykin Nearing, MD;  Location: [facility] ENDOSCOPY;  Service: Gastroenterology;  Laterality: N/A;   HEMOSTASIS CLIP PLACEMENT  12/11/2022   Procedure: HEMOSTASIS CLIP PLACEMENT;  Surgeon: Norma Fredrickson, Boykin Nearing, MD;  Location[facility]COPY;  Service: Gastroenterology;;   POLYPECTOMY  12/11/2022   Procedure: POLYPECTOMY;  Surgeon: Toledo, Boykin Nearing, MD;  Location: [facility] ENDOSCOPY;  Service: Gastroenterology;;     Social History:   reports that he has quit smoking. He has never used smokeless tobacco. He reports that he does not drink alcohol and does not use drugs.   Family History:  His family history includes Aneurysm in his mother; Heart attack in his brother, father, and sister.   Allergies Allergies  Allergen Reactions   Phenothiazines Other (See Comments)    Extra-pyramidal reaction.   Extra-pyramidal reaction   Prochlorperazine Edisylate      Home Medications  Prior to Admission medications   Medication Sig Start Date End Date Taking? Authorizing Provider  acetaminophen (TYLENOL) 500 MG tablet Take 1,000 mg by mouth every 6 (six) hours as needed for mild pain (pain score 1-3).   Yes [provider]  amoxicillin (AMOXIL) 500 MG tablet Take 2,000 mg by mouth once. 12/03/22  Yes [provider]  atorvastatin (LIPITOR) 20 MG tablet Take 1 tablet (20 mg total) by mouth daily. 05/29/22  Yes Antonieta Iba, MD  Cholecalciferol (VITAMIN D3) 2000 units capsule Take 2,000 Units by mouth daily.   Yes [provider]  diclofenac Sodium (VOLTAREN) 1 % GEL Apply topically 4 (four) times daily as needed.   Yes [provider]  ELIQUIS 2.5 MG TABS tablet Take 1 tablet (2.5 mg total) by mouth 2 (two) times daily. 05/29/22  Yes Antonieta Iba, MD  esomeprazole (NEXIUM) 40 MG capsule Take 40 mg by mouth as needed.   Yes [provider]  fluticasone (FLONASE) 50 MCG/ACT nasal spray Place 2 sprays into both nostrils daily. 04/25/09  Yes [provider]  furosemide (LASIX) 40 MG tablet Take 1 tablet (40 mg total) by mouth 2 (two) times daily. 05/29/22  Yes Antonieta Iba, MD  losartan (COZAAR) 50 MG tablet Take 1 tablet (50 mg total) by mouth daily. 05/29/22  Yes Antonieta Iba, MD  metoprolol succinate (TOPROL-XL) 100 MG 24 hr tablet Take 1 tablet (100 mg total) by mouth 2 (two) times daily. Take with or immediately following a meal. Patient taking differently: Take 150 mg by mouth 2 (two) times daily. Take with or immediately following a meal. 05/29/22  Yes Gollan, Tollie Pizza, MD  psyllium (METAMUCIL) 0.52 g capsule Take 0.52 g by mouth daily.   Yes [provider]  Saw Palmetto 450 MG CAPS Take 450 mg by mouth daily.   Yes [provider]  sodium bicarbonate 650 MG tablet Take 650 mg by mouth 2 (two) times daily.   Yes [provider]  tacrolimus (PROTOPIC) 0.1 % ointment Apply topically. 08/17/12  Yes [provider]  tamsulosin (FLOMAX) 0.4 MG CAPS capsule Take 0.4 mg by mouth daily after supper.   Yes [provider]       DVT/GI PRX  assessed I Assessed the need for Labs I Assessed the need for Foley I Assessed the need for Central Venous Line Family Discussion when available I Assessed the need for Mobilization I made an Assessment of medications to be adjusted accordingly Safety Risk assessment completed  CASE DISCUSSED IN MULTIDISCIPLINARY ROUNDS WITH ICU TEAM     Critical Care Time devoted to patient care services described in this note is 75 minutes.  Critical care was necessary to treat /prevent imminent and  life-threatening deterioration. Overall, patient is critically ill, prognosis is guarded.  Patient with Multiorgan failure and at high risk for cardiac arrest and death.    Lucie Leather, M.D.  [facility]  Medical Director [facility] Medical Director [facility] Cardio-Pulmonary Department

## 2022-12-15 NOTE — Progress Notes (Signed)
GI Inpatient Follow-up Note  Subjective:  Patient seen in follow-up for post-polypectomy bleed. Overnight there were concerns for altered mental status and code stroke was called. CT scans negative for acute CVA. He had a normal BM today. Hemoglobin has remained stable. No further bleeding noted.   Scheduled Inpatient Medications:   sodium chloride   Intravenous Once   atorvastatin  20 mg Oral Daily   Chlorhexidine Gluconate Cloth  6 each Topical Daily   midodrine  10 mg Oral Q8H   pantoprazole (PROTONIX) IV  40 mg Intravenous Q12H   sodium bicarbonate  650 mg Oral BID   sodium chloride flush  10 mL Intravenous Q12H   sodium chloride flush  10-40 mL Intracatheter Q12H   tamsulosin  0.4 mg Oral QPC supper    Continuous Inpatient Infusions:    azithromycin Stopped (12/14/22 2139)   norepinephrine (LEVOPHED) Adult infusion 1 mcg/min (12/15/22 0848)   piperacillin-tazobactam (ZOSYN)  IV 2.25 g (12/15/22 0857)   sodium bicarbonate 150 mEq in sterile water 1,150 mL infusion 50 mL/hr at 12/15/22 0700    PRN Inpatient Medications:  acetaminophen **OR** acetaminophen, HYDROcodone-acetaminophen, morphine injection, ondansetron **OR** ondansetron (ZOFRAN) IV, mouth rinse, sodium chloride flush  Review of Systems: Constitutional: Weight is stable.  Eyes: No changes in vision. ENT: No oral lesions, sore throat.  GI: see HPI.  Heme/Lymph: No easy bruising.  CV: No chest pain.  GU: No hematuria.  Integumentary: No rashes.  Neuro: No headaches.  Psych: No depression/anxiety.  Endocrine: No heat/cold intolerance.  Allergic/Immunologic: No urticaria.  Resp: No cough, SOB.  Musculoskeletal: No joint swelling.    Physical Examination: BP 120/71   Pulse 93   Temp 98.7 F (37.1 C)   Resp (!) 24   Ht 5\' 9"  (1.753 m)   Wt 76.2 kg   SpO2 99%   BMI 24.81 kg/m  Gen: NAD, alert and oriented x 4 HEENT: PEERLA, EOMI, Neck: supple, no JVD or thyromegaly Chest: CTA bilaterally, no  wheezes, crackles, or other adventitious sounds CV: RRR, no m/g/c/r Abd: soft, NT, ND, +BS in all four quadrants; no HSM, guarding, ridigity, or rebound tenderness Ext: no edema, well perfused with 2+ pulses, Skin: no rash or lesions noted Lymph: no LAD  Data: Lab Results  Component Value Date   WBC 8.0 12/15/2022   HGB 7.3 (L) 12/15/2022   HCT 21.6 (L) 12/15/2022   MCV 87.4 12/15/2022   PLT 89 (L) 12/15/2022   Recent Labs  Lab 12/14/22 2005 12/15/22 0151 12/15/22 0341  HGB 7.6* 7.5* 7.3*   Lab Results  Component Value Date   NA 141 12/15/2022   K 4.0 12/15/2022   CL 105 12/15/2022   CO2 26 12/15/2022   BUN 55 (H) 12/15/2022   CREATININE 3.72 (H) 12/15/2022   Lab Results  Component Value Date   ALT 8 12/13/2022   AST 15 12/13/2022   ALKPHOS 33 (L) 12/13/2022   BILITOT 0.9 12/13/2022   Recent Labs  Lab 12/14/22 0149  APTT 39*  INR 1.4*   CSY 12/13/2022 - 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, hematin found in entire colon, active bleeding seen in cecum 2/2 previous polypectomy procedure with three hemostatic clips placed, one 12 mm polyp found in sigmoid colon not removed, adherent post-polypectomy clips noted in ascending colon without stigmata of recent bleeding  Assessment/Plan:  82 y/o Caucasian male with a PMH of obesity, HTN, mixed HLD, PAF on chronic anticoagulation (held starting 12/2), CKD Stage V,  CAD s/p CABG, hx of ascending aortic dissection s/p repair 1974, s/p bioprosthetic aortic valve, and SVT presented to the [facility] ED 12/5 for chief complaint of hematochezia c/w PPB s/p colonoscopy 12/6 with active bleeding in cecum requiring three hemostatic clips for endoscopic hemostasis. GI following.    Post-polypectomy bleed (PPB) from cecum - resolving, clinically no signs of recurrent bleeding   Acute blood loss anemia - 2/2 PPB. Hemoglobin 7.3 this AM. No overt gastrointestinal bleeding overnight or this morning.   Code stroke -  Neurology following, CT negative, MRI planned   Hypovolemic shock   Pneumonia   IDA/anemia of chronic disease  CAD s/p CABG  CKD Stage V  Chronic anticoagulation    Recommendations:  - Maintain 2 large bore IVs for access - Continue to monitor serial H&H. Goal hemoglobin 8.0 or higher. Transfuse PRN to keep hemoglobin >8.0.  - Supportive care with IV fluid hydration, pain control, and antiemetics prn - Continue management of shock/pneumonia per critical care team - Follow-up on MRI for possible stroke - Continue Protonix 40 mg BID for gastric protection  - No signs of ongoing GI bleeding at this time. Continue to monitor for s/s GIB. - No indication for repeat luminal evaluation at this time - GI available as needed for concerns of bleeding - ADAT - GI following along with you   Please call with questions or concerns.   Jacob Moores, PA-C [facility] Gastroenterology [phone]

## 2022-12-15 NOTE — Progress Notes (Signed)
Same day note - no charge  This is an 82 year old man with past medical history significant for hypertension, hyperlipidemia, CAD status post CABG, ascending aortic dissection status postrepair in 1974, bioprosthetic aortic valve, SVT, A-fib on Eliquis on hold for colonoscopy admitted for subsequent GI bleed on whom inpatient telestroke was called early this morning for left gaze deviation and right-sided weakness.  He quickly improved and his NIH stroke scale for the teleneurologist was 1 for dysarthria.  He had a second event shortly after that was characterized by unresponsiveness and subsequent gaze deviation to the left.  On my examination he had again returned to normal and had a stroke scale of 1 for dysarthria.  My examination was unchanged from the telestroke note.  Given the severity of his symptoms, CT and CTA with no acute findings, and his rapid return to baseline after both events favor etiology to be seizure.  He has no history of seizures therefore query if patient may have had a small ischemic infarct in the setting of a fib holding eliquis.  - Given severity of events one of which required transfer to the ICU, load with keppra and start 250mg  bid renal dosing - MRI brain wo contrast 2/2 CKD - rEEG tomorrow - Permissive HTN x48 hrs from sx onset or until stroke ruled out by MRI goal BP <220/120. PRN labetalol or hydralazine if BP above these parameters. Avoid oral antihypertensives. - No anticoagulation or antiplatelets in setting of active GIB - q4 hr neuro checks - STAT head CT for any change in neuro exam - Tele - PT/OT/SLP - If MRI brain shows e/o acute ischemia will proceed with further stroke workup - Neurology will continue to follow.  Bing Neighbors, MD [facility] [phone]  If 7pm- 7am, please page neurology on call as listed in AMION.

## 2022-12-15 NOTE — Progress Notes (Signed)
1610 Code stroke called on this patient. Patient is unable to be aroused even to noxious stimuli and pain. Respiration are regular but shallow.Patient transported to CT scanner via bed. CT and CTA performed per code stroke protocol. Moved from bed to CT scanner table and back. Patient still  not arousing. After returned to room and tele-neurologist present, patients name was yelled out and he awoke. The patient was able to be assessed per NIH stroke scale. Patient answered to name and was able to perform task as asked per Neurologist. Speech was somewhat slurred but easy to understand. After exam done and CT scans interpreted patient was cleared of stroke diagnosis. 0730 Patient recognized nurse and called her by name. He was tearful and stated he was afraid. Patient ask for wife by name. Patient also became upset from memory of pain inflicted to arouse him. Patient's general demeanor was not at baseline. Bedside swallow evaluation done and helped with breakfast.. As day progressed he became more normal in his facial expressions ,speech etc. 1500 Down to MRI for scan.  1600 Back to room. Family and friends in and out all day.Patient laughed and joked with everyone-his normal baseline behavior.

## 2022-12-15 NOTE — Plan of Care (Signed)
  Problem: Education: Goal: Knowledge of General Education information will improve Description: Including pain rating scale, medication(s)/side effects and non-pharmacologic comfort measures Outcome: Progressing   Problem: Health Behavior/Discharge Planning: Goal: Ability to manage health-related needs will improve Outcome: Progressing   Problem: Clinical Measurements: Goal: Ability to maintain clinical measurements within normal limits will improve Outcome: Progressing Goal: Will remain free from infection Outcome: Progressing Goal: Diagnostic test results will improve Outcome: Progressing Goal: Respiratory complications will improve Outcome: Progressing Goal: Cardiovascular complication will be avoided Outcome: Progressing   Problem: Nutrition: Goal: Adequate nutrition will be maintained Outcome: Progressing   Problem: Coping: Goal: Level of anxiety will decrease Outcome: Progressing   Problem: Elimination: Goal: Will not experience complications related to bowel motility Outcome: Progressing Goal: Will not experience complications related to urinary retention Outcome: Progressing   Problem: Pain Management: Goal: General experience of comfort will improve Outcome: Progressing   Problem: Safety: Goal: Ability to remain free from injury will improve Outcome: Progressing

## 2022-12-15 NOTE — Progress Notes (Signed)
   12/15/22 0645  Spiritual Encounters  Type of Visit Initial  Referral source Code page  Reason for visit Code  OnCall Visit Yes   Chaplain responded to Code Stroke. Patient at CT. No family present at the time of visit. Chaplainwill monitor and services available.

## 2022-12-15 NOTE — Progress Notes (Signed)
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 Pharmacy is asked to follow and replace electrolytes while in CCU.   Goal of Therapy:  Electrolytes WNL  Plan:  ---no electrolyte replacement warranted for today ---recheck electrolytes in am  Lowella Bandy ,PharmD Clinical Pharmacist 12/15/2022 6:52 AM

## 2022-12-15 NOTE — Plan of Care (Signed)
Patient alert and awake NAD VS stable No bleeding Off pressors  SD status and can transfer to [facility]

## 2022-12-15 NOTE — Progress Notes (Signed)
Stroke Code:  Patient LKW at 0400 with full exam per bedside RN At 0615 he was pulling off tele monitor and yelling to urinate (not noted if patient was moving equally) I was called to see patient at [phone] with acute onset unresponsiveness Upon my arrival patient was non verbal, not following commands, not moving extremities, midline gaze with blank stare, no blink to threat. About 2 minutes later noted left gaze deviation. No response to noxious stimulation on right upper or lower extremity. Brisk withdrawal to left upper and lower.  Stroke code called Patient taken to CT scanner for imaging. Tele Neurology attending on tele monitor and evaluated patient and imaging. Per radiology and neuro no ICH or LVO.  Shortly after arrival back to the ICU within 5 minutes he is now with verbal output, however, garbled speech, and emotional. Neurology will be consulted now, and family will be updated.   See Tele Neurology note for further details.

## 2022-12-15 NOTE — Progress Notes (Signed)
IP Code Stroke cart activated in CT @0657 . LKWT 0400. ROS [id number]. Bedside RN reports left gaze preference and right-sided weakness. Admitted for GI bleed after colonoscopy procedure 12/11/2022. Not a TNK candidate. BGL 129. Advanced imaging ordered by IP provider.   TSMD paged @0701  Dr. Allena Katz on camera @0708   Code Stroke imaging negative for ICH. No LVO.  Pt returned from CT @0723 . NIH 1. TSMD and TSRN off cart @0733 . Telestroke Charity fundraiser

## 2022-12-15 NOTE — Progress Notes (Addendum)
1308- patient pulling leads off went to reattach the leads and patient started saying, I'm peeing, I'm peeing" Patient encourage to finish urinating. After returning with a clean gown and pad the patient was noted to be sleeping. VSS at this point, patients name was called several times along with sternal rub and by this point several RN's were at bedside trying to stimulate patient with no response from patient initially. After repeated attempts patient did wake up and started swinging his arms and fist but would not answer questions, respond to commands, or track with his eyes. Patient did try to kick staff after repeated stimulation but would not respond to commands; vital signs stable with no changes noted.  0630- NP was notified that the patient was not responding correctly and came to do an assessment at bedside and noted a left deviation and that patient was non verbal at this point, NP questioned if the patient was having a stroke. Code stroke called at that time and orders entered for code stroke. NP and day shift RN at bedside while patient being prepared for transport to CT.   0645-Day shift RN and this RN transported patient to CT.   0656-Tele-neuro activated  0730- returned from CT  0740- Tele-neuro MD assessing patient at bedside.

## 2022-12-16 ENCOUNTER — Ambulatory Visit: Payer: Medicare Other

## 2022-12-16 DIAGNOSIS — K91840 Postprocedural hemorrhage and hematoma of a digestive system organ or structure following a digestive system procedure: Secondary | ICD-10-CM | POA: Diagnosis not present

## 2022-12-16 LAB — TYPE AND SCREEN
ABO/RH(D): O POS
Antibody Screen: NEGATIVE
Unit division: 0
Unit division: 0
Unit division: 0
Unit division: 0
Unit division: 0
Unit division: 0
Unit division: 0
Unit division: 0
Unit division: 0

## 2022-12-16 LAB — BPAM RBC
Blood Product Expiration Date: 202501032359
Blood Product Expiration Date: 202412312359
Blood Product Expiration Date: 202412312359
Blood Product Expiration Date: 202412312359
Blood Product Expiration Date: 202412312359
Blood Product Expiration Date: 202501032359
Blood Product Unit Number: 202501032359
ISSUE DATE / TIME: 202412051151
ISSUE DATE / TIME: 202412051151
ISSUE DATE / TIME: 202412052302
ISSUE DATE / TIME: 202412060154
ISSUE DATE / TIME: 202412060624
ISSUE DATE / TIME: 202412080829
ISSUE DATE / TIME: 202501032359
ISSUE DATE / TIME: 202501032359
PRODUCT CODE: 202412061929
PRODUCT CODE: 202412070620
Unit Type and Rh: 202501032359
Unit Type and Rh: 202501032359
Unit Type and Rh: 5100
Unit Type and Rh: 202501032359
Unit Type and Rh: 202501032359
Unit Type and Rh: 202501032359
Unit Type and Rh: 202501032359
Unit Type and Rh: 5100
Unit Type and Rh: 5100
Unit Type and Rh: 5100
Unit Type and Rh: 5100
Unit Type and Rh: 5100
Unit Type and Rh: 5100
Unit Type and Rh: 5100
Unit Type and Rh: 5100

## 2022-12-16 LAB — CALCIUM, IONIZED: Calcium, Ionized, Serum: 4.8 mg/dL (ref 4.5–5.6)

## 2022-12-16 LAB — BASIC METABOLIC PANEL
Anion gap: 9 (ref 5–15)
BUN: 56 mg/dL — ABNORMAL HIGH (ref 8–23)
CO2: 30 mmol/L (ref 22–32)
Calcium: 6.9 mg/dL — ABNORMAL LOW (ref 8.9–10.3)
Chloride: 101 mmol/L (ref 98–111)
Creatinine, Ser: 3.72 mg/dL — ABNORMAL HIGH (ref 0.61–1.24)
GFR, Estimated: 15 mL/min — ABNORMAL LOW (ref >60)
Glucose, Bld: 118 mg/dL — ABNORMAL HIGH (ref 70–99)
Potassium: 3.8 mmol/L (ref 3.5–5.1)
Sodium: 140 mmol/L (ref 135–145)

## 2022-12-16 LAB — FOLATE: Folate: 8.8 ng/mL (ref >5.9)

## 2022-12-16 LAB — CBC
HCT: 22.4 % — ABNORMAL LOW (ref 39.0–52.0)
Hemoglobin: 7.4 g/dL — ABNORMAL LOW (ref 13.0–17.0)
MCH: 29.6 pg (ref 26.0–34.0)
MCHC: 33 g/dL (ref 30.0–36.0)
MCV: 89.6 fL (ref 80.0–100.0)
Platelets: 94 10*3/uL — ABNORMAL LOW (ref 150–400)
RBC: 2.5 MIL/uL — ABNORMAL LOW (ref 4.22–5.81)
RDW: 17 % — ABNORMAL HIGH (ref 11.5–15.5)
WBC: 6 10*3/uL (ref 4.0–10.5)
nRBC: 0 % (ref 0.0–0.2)

## 2022-12-16 LAB — VITAMIN B12: Vitamin B-12: 485 pg/mL (ref 180–914)

## 2022-12-16 LAB — PREPARE RBC (CROSSMATCH)

## 2022-12-16 LAB — MAGNESIUM: Magnesium: 2 mg/dL (ref 1.7–2.4)

## 2022-12-16 LAB — PHOSPHORUS: Phosphorus: 3.7 mg/dL (ref 2.5–4.6)

## 2022-12-16 MED ORDER — SODIUM CHLORIDE 0.9% IV SOLUTION
Freq: Once | INTRAVENOUS | Status: AC
Start: 2022-12-16 — End: 2022-12-16

## 2022-12-16 MED ORDER — MIDODRINE HCL 5 MG PO TABS
10.0000 mg | ORAL_TABLET | Freq: Three times a day (TID) | ORAL | Status: DC
  Administered 2022-12-16 – 2022-12-18 (×7): 10 mg via ORAL
  Filled 2022-12-16 (×8): qty 2

## 2022-12-16 MED ORDER — PANTOPRAZOLE SODIUM 40 MG PO TBEC
40.0000 mg | DELAYED_RELEASE_TABLET | Freq: Two times a day (BID) | ORAL | Status: DC
  Administered 2022-12-16 – 2022-12-20 (×9): 40 mg via ORAL
  Filled 2022-12-16 (×9): qty 1

## 2022-12-16 MED ORDER — SODIUM CHLORIDE 0.9 % IV SOLN
INTRAVENOUS | Status: DC
  Administered 2022-12-16: 75 mL/h via INTRAVENOUS

## 2022-12-16 MED ORDER — DILTIAZEM HCL 25 MG/5ML IV SOLN
10.0000 mg | Freq: Four times a day (QID) | INTRAVENOUS | Status: DC | PRN
  Filled 2022-12-16: qty 5

## 2022-12-16 MED ORDER — AMOXICILLIN-POT CLAVULANATE 500-125 MG PO TABS
1.0000 | ORAL_TABLET | Freq: Two times a day (BID) | ORAL | Status: AC
  Administered 2022-12-17 – 2022-12-18 (×4): 1 via ORAL
  Filled 2022-12-16 (×4): qty 1

## 2022-12-16 NOTE — Plan of Care (Signed)
  Problem: Education: Goal: Knowledge of General Education information will improve Description: Including pain rating scale, medication(s)/side effects and non-pharmacologic comfort measures Outcome: Progressing   Problem: Clinical Measurements: Goal: Ability to maintain clinical measurements within normal limits will improve Outcome: Progressing Goal: Will remain free from infection Outcome: Progressing Goal: Diagnostic test results will improve Outcome: Progressing Goal: Respiratory complications will improve Outcome: Progressing Goal: Cardiovascular complication will be avoided Outcome: Progressing   Problem: Activity: Goal: Risk for activity intolerance will decrease Outcome: Progressing   Problem: Pain Management: Goal: General experience of comfort will improve Outcome: Progressing

## 2022-12-16 NOTE — Progress Notes (Signed)
Eeg done 

## 2022-12-16 NOTE — TOC Progression Note (Signed)
Transition of Care (TOC) - Progression Note    Patient Details  Name: Terry GUIANG Sr. MRN: [id number] Date of Birth: March 26, 1940  [facility]) CM/SW Contact  Truddie Hidden, RN Phone Number: 12/16/2022, 2:52 PM  Clinical Narrative:    TOC continuing to follow patient's progress throughout discharge planning.        Expected Discharge Plan and Services                                               Social Determinants of Health (SDOH) Interventions SDOH Screenings   Food Insecurity: No Food Insecurity (12/12/2022)  Housing: Low Risk  (12/12/2022)  Transportation Needs: No Transportation Needs (12/12/2022)  Utilities: Not At Risk (12/12/2022)  Depression (PHQ2-9): Low Risk  (07/09/2022)  Tobacco Use: Medium Risk (12/13/2022)    Readmission Risk Interventions     No data to display

## 2022-12-16 NOTE — Progress Notes (Signed)
[facility]  ROUNDING NOTE   Subjective:   Patient resting comfortably in bed at the moment. Renal function appears to have stabilized with a creatinine of 3.7. 12/08 0701 - 12/09 0700 In: 3179.5 [P.O.:940; I.V.:1297.5; Blood:589; IV Piggyback:353] Out: 675 [Urine:675] Lab Results  Component Value Date   CREATININE 3.72 (H) 12/16/2022   CREATININE 3.72 (H) 12/15/2022   CREATININE 3.85 (H) 12/14/2022     Objective:  Vital signs in last 24 hours:  Temp:  [97.5 F (36.4 C)-98.7 F (37.1 C)] 98 F (36.7 C) (12/09 0400) Pulse Rate:  [66-97] 72 (12/09 0700) Resp:  [13-28] 15 (12/09 0700) BP: (89-176)/(47-96) 101/58 (12/09 0700) SpO2:  [96 %-100 %] 97 % (12/09 0700)  Weight change:  Filed Weights   12/11/22 2135  Weight: 76.2 kg    Intake/Output: I/O last 3 completed shifts: In: 4278.5 [P.O.:940; I.V.:1946.5; Blood:589; IV Piggyback:803] Out: 675 [Urine:675]   Intake/Output this shift:  No intake/output data recorded.  Physical Exam: General: NAD  Head: Normocephalic, atraumatic. Moist oral mucosal membranes  Eyes: Anicteric  Neck: Supple, trachea midline  Lungs:  Clear to auscultation  Heart: Regular rate and rhythm  Abdomen:  Soft, nontender,   Extremities: no peripheral edema.  Neurologic: Alert and oriented x 4  Skin: No lesions  Access: Left AVF + thrill and +bruit    Basic Metabolic Panel: Recent Labs  Lab 12/13/22 0537 12/13/22 1949 12/13/22 2212 12/14/22 0435 12/14/22 1007 12/14/22 2044 12/15/22 0341 12/16/22 0445  NA 145 145 147* 146*  --   --  141 140  K 3.9 6.0* 3.5 3.4*  --   --  4.0 3.8  CL 116* 118* 113* 112*  --   --  105 101  CO2 15* 21* 24 24  --   --  26 30  GLUCOSE 127* 191* 146* 127*  --   --  153* 118*  BUN 62* 48* 57* 57*  --   --  55* 56*  CREATININE 3.75* 3.15* 3.67* 3.85*  --   --  3.72* 3.72*  CALCIUM 6.7* 6.1* 6.6* 7.1*  --   --  7.2* 6.9*  MG 1.8  --   --   --  1.6*  --  2.2  --   PHOS  --   --   --   --    --  3.9 4.2 3.7    Liver Function Tests: Recent Labs  Lab 12/13/22 0537 12/13/22 1949  AST 12* 15  ALT 11 8  ALKPHOS 45 33*  BILITOT 1.1 0.9  PROT 3.7* 3.0*  ALBUMIN 2.3* 1.8*   No results for input(s): "LIPASE", "AMYLASE" in the last 168 hours. No results for input(s): "AMMONIA" in the last 168 hours.  CBC: Recent Labs  Lab 12/11/22 2140 12/12/22 0418 12/13/22 1949 12/14/22 0149 12/14/22 0435 12/14/22 0554 12/14/22 2005 12/15/22 0151 12/15/22 0341 12/15/22 1405 12/16/22 0445  WBC 4.7   < > 7.6 12.8* 12.8*  --   --   --  8.0  --  6.0  NEUTROABS 2.8  --  6.0 10.2*  --   --   --   --   --   --   --   HGB 10.6*   < > 9.2* 7.3* 7.1*   < > 7.6* 7.5* 7.3* 7.7* 7.4*  HCT 34.7*   < > 27.4* 21.3* 20.7*   < > 22.8* 22.2* 21.6* 22.3* 22.4*  MCV 99.1   < > 89.3 87.3 85.9  --   --   --  87.4  --  89.6  PLT 154   < > 81* 102* 101*  --   --   --  89*  --  94*   < > = values in this interval not displayed.    Cardiac Enzymes: No results for input(s): "CKTOTAL", "CKMB", "CKMBINDEX", "TROPONINI" in the last 168 hours.  BNP: Invalid input(s): "POCBNP"  CBG: Recent Labs  Lab 12/15/22 0637 12/15/22 0748 12/15/22 1145 12/15/22 1558 12/15/22 1928  GLUCAP 129* 140* 190* 151* 117*    Microbiology: Results for orders placed or performed during the hospital encounter of 12/12/22  MRSA Next Gen by PCR, Nasal     Status: None   Collection Time: 12/12/22  5:28 PM   Specimen: Nasal Mucosa; Nasal Swab  Result Value Ref Range Status   MRSA by PCR Next Gen NOT DETECTED NOT DETECTED Final    Comment: (NOTE) The GeneXpert MRSA Assay (FDA approved for NASAL specimens only), is one component of a comprehensive MRSA colonization surveillance program. It is not intended to diagnose MRSA infection nor to guide or monitor treatment for MRSA infections. Test performance is not FDA approved in patients less than 83 years old. Performed at [facility], [address]  [place], [place] [address]   Culture, blood (Routine X 2) w Reflex to ID Panel     Status: None (Preliminary result)   Collection Time: 12/13/22  7:42 PM   Specimen: BLOOD  Result Value Ref Range Status   Specimen Description BLOOD BLOOD LEFT ARM  Final   Special Requests   Final    BOTTLES DRAWN AEROBIC AND ANAEROBIC Blood Culture results may not be optimal due to an excessive volume of blood received in culture bottles   Culture   Final    NO GROWTH 3 DAYS Performed at [facility], [address] [place], [place] [address]    Report Status PENDING  Incomplete  Culture, blood (Routine X 2) w Reflex to ID Panel     Status: None (Preliminary result)   Collection Time: 12/13/22  9:35 PM   Specimen: BLOOD  Result Value Ref Range Status   Specimen Description BLOOD BLOOD RIGHT ARM  Final   Special Requests   Final    BOTTLES DRAWN AEROBIC AND ANAEROBIC Blood Culture adequate volume   Culture   Final    NO GROWTH 3 DAYS Performed at [facility], [address], [place], [place] [address]    Report Status PENDING  Incomplete    Coagulation Studies: Recent Labs    12/14/22 0149  LABPROT 17.0*  INR 1.4*    Urinalysis: No results for input(s): "COLORURINE", "LABSPEC", "PHURINE", "GLUCOSEU", "HGBUR", "BILIRUBINUR", "KETONESUR", "PROTEINUR", "UROBILINOGEN", "NITRITE", "LEUKOCYTESUR" in the last 72 hours.  Invalid input(s): "APPERANCEUR"    Imaging: MR BRAIN WO CONTRAST  Result Date: 12/15/2022 CLINICAL DATA:  Temporary episode of unresponsiveness EXAM: MRI HEAD WITHOUT CONTRAST TECHNIQUE: Multiplanar, multiecho pulse sequences of the brain and surrounding structures were obtained without intravenous contrast. COMPARISON:  No prior MRI available, correlation is made with 12/15/2022 CT head FINDINGS: Brain: No restricted diffusion to suggest acute or subacute infarct. No acute hemorrhage, mass, mass effect, or midline shift. No hydrocephalus or extra-axial  collection. Partial empty sella. Normal craniocervical junction. Scattered foci of hemosiderin deposition in the bilateral cerebral hemispheres. Remote lacunar infarcts in the bilateral corona radiata. Scattered T2 hyperintense signal in the periventricular white matter, likely the sequela of mild chronic small vessel ischemic disease. Vascular: Normal arterial  flow voids. Skull and upper cervical spine: Normal marrow signal. Sinuses/Orbits: Mucosal thickening in the left maxillary sinus. Status post bilateral lens replacements. No acute finding in the orbits. IMPRESSION: No acute intracranial process. No evidence of acute or subacute infarct. Electronically Signed   By: Wiliam Ke M.D.   On: 12/15/2022 20:55   ECHOCARDIOGRAM COMPLETE  Result Date: 12/15/2022    ECHOCARDIOGRAM REPORT   Patient Name:   Terry BASSANO Sr. Date of Exam: 12/14/2022 Medical Rec #:  [id number]              Height:       69.0 in Accession #:    [id number]             Weight:       168.0 lb Date of Birth:  08-24-1940              BSA:          1.918 m Patient Age:    82 years               BP:           113/70 mmHg Patient Gender: M                      HR:           84 bpm. Exam Location:  [facility] Procedure: 2D Echo, Cardiac Doppler and Color Doppler Indications:     Shock R57.9  History:         Patient has prior history of Echocardiogram examinations. CAD;                  Arrythmias:Atrial Fibrillation.                  Aortic Valve: unknown bioprosthetic valve is present in the                  aortic position. Procedure Date: 2012.  Sonographer:     Neysa Bonito Roar Referring Phys:  [id number] Judithe Modest Diagnosing Phys: Jodelle Red MD IMPRESSIONS  1. Left ventricular ejection fraction, by estimation, is 50 to 55%. The left ventricle has low normal function. The left ventricle demonstrates regional wall motion abnormalities (see scoring diagram/findings for description). There is mild left ventricular hypertrophy. Left  ventricular diastolic parameters are consistent with Grade II diastolic dysfunction (pseudonormalization). There is hypokinesis of the left ventricular, mid-apical inferoseptal wall, inferior wall and apical segment.  2. Right ventricular systolic function is normal. The right ventricular size is moderately enlarged. There is severely elevated pulmonary artery systolic pressure. The estimated right ventricular systolic pressure is 64.3 mmHg.  3. Left atrial size was moderately dilated.  4. Right atrial size was severely dilated.  5. The mitral valve is degenerative. Mild mitral valve regurgitation. Mild mitral stenosis.  6. Tricuspid valve regurgitation is severe.  7. The aortic valve has been repaired/replaced. There is moderate calcification of the aortic valve. There is moderate thickening of the aortic valve. Aortic valve regurgitation is trivial. 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 Procedure Date: 2012.  8. 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  9. The inferior vena cava is dilated in size with <50% respiratory variability, suggesting right atrial pressure of 15 mmHg. Comparison(s): Prior images unable to be directly viewed, comparison made by report only. Echo from [facility] 03/02/22 noted EF 40%, prior echo 2023 with regional wall motion  abnormalities in the septum, apex, and inferior walls, moderate RV enlargement with mildly decreased function, bioprosthetic aortic valve. Conclusion(s)/Recommendation(s): LV appears similar to description from [facility] echo in 2024. There is severe TR, severely enlarged RA, and severe pulmonary hypertension. Pulmonary hypertension is worse compared to prior report. FINDINGS  Left Ventricle: Left ventricular ejection fraction, by estimation, is 50 to 55%. The left ventricle has low normal function. The left ventricle demonstrates regional wall motion abnormalities. The left ventricular internal cavity size  was normal in size. There is mild left ventricular hypertrophy. Left ventricular diastolic parameters are consistent with Grade II diastolic dysfunction (pseudonormalization). Right Ventricle: The right ventricular size is moderately enlarged. Right vetricular wall thickness was not well visualized. Right ventricular systolic function is normal. There is severely elevated pulmonary artery systolic pressure. The tricuspid regurgitant velocity is 3.51 m/s, and with an assumed right atrial pressure of 15 mmHg, the estimated right ventricular systolic pressure is 64.3 mmHg. Left Atrium: Left atrial size was moderately dilated. Right Atrium: Right atrial size was severely dilated. Pericardium: There is no evidence of pericardial effusion. Mitral Valve: The mitral valve is degenerative in appearance. There is moderate thickening of the anterior mitral valve leaflet(s). There is moderate calcification of the mitral valve leaflet(s). Mild to moderate mitral annular calcification. Mild mitral  valve regurgitation. Mild mitral valve stenosis. MV peak gradient, 12.7 mmHg. The mean mitral valve gradient is 6.0 mmHg. Tricuspid Valve: The tricuspid valve is normal in structure. Tricuspid valve regurgitation is severe. No evidence of tricuspid stenosis. Aortic Valve: The aortic valve has been repaired/replaced. There is moderate calcification of the aortic valve. There is moderate thickening of the aortic valve. Aortic valve regurgitation is trivial. Mild aortic stenosis is present. Aortic valve mean gradient measures 19.0 mmHg. Aortic valve peak gradient measures 41.9 mmHg. Aortic valve area, by VTI measures 1.59 cm. There is a unknown bioprosthetic valve present in the aortic position. Procedure Date: 2012. Pulmonic Valve: The pulmonic valve was not well visualized. Pulmonic valve regurgitation is mild. No evidence of pulmonic stenosis. Aorta: The aortic root/ascending aorta has been repaired/replaced. Venous: The inferior vena  cava is dilated in size with less than 50% respiratory variability, suggesting right atrial pressure of 15 mmHg. IAS/Shunts: The atrial septum is grossly normal. Additional Comments: There is a small pleural effusion in the left lateral region.  LEFT VENTRICLE PLAX 2D LVIDd:         4.70 cm   Diastology LVIDs:         3.10 cm   LV e' medial:    8.92 cm/s LV PW:         1.30 cm   LV E/e' medial:  16.4 LV IVS:        1.30 cm   LV e' lateral:   14.90 cm/s LVOT diam:     2.30 cm   LV E/e' lateral: 9.8 LV SV:         106 LV SV Index:   55 LVOT Area:     4.15 cm  RIGHT VENTRICLE RV Basal diam:  4.20 cm RV Mid diam:    3.60 cm RV S prime:     9.36 cm/s TAPSE (M-mode): 2.2 cm LEFT ATRIUM             Index        RIGHT ATRIUM           Index LA diam:        4.00 cm 2.09 cm/m  RA Area:     38.70 cm LA Vol (A2C):   72.9 ml 38.00 ml/m  RA Volume:   148.00 ml 77.15 ml/m LA Vol (A4C):   63.0 ml 32.84 ml/m LA Biplane Vol: 68.3 ml 35.60 ml/m  AORTIC VALVE                     PULMONIC VALVE AV Area (Vmax):    1.75 cm      PV Vmax:          1.14 m/s AV Area (Vmean):   1.74 cm      PV Peak grad:     5.2 mmHg AV Area (VTI):     1.59 cm      PR End Diast Vel: 9.12 msec AV Vmax:           323.50 cm/s   RVOT Peak grad:   2 mmHg AV Vmean:          201.500 cm/s AV VTI:            0.671 m AV Peak Grad:      41.9 mmHg AV Mean Grad:      19.0 mmHg LVOT Vmax:         136.00 cm/s LVOT Vmean:        84.400 cm/s LVOT VTI:          0.256 m LVOT/AV VTI ratio: 0.38  AORTA Ao Root diam: 3.70 cm Ao Asc diam:  4.20 cm MITRAL VALVE                TRICUSPID VALVE MV Area (PHT): 2.49 cm     TR Peak grad:   49.3 mmHg MV Area VTI:   2.20 cm     TR Vmax:        351.00 cm/s MV Peak grad:  12.7 mmHg MV Mean grad:  6.0 mmHg     SHUNTS MV Vmax:       1.78 m/s     Systemic VTI:  0.26 m MV Vmean:      113.0 cm/s   Systemic Diam: 2.30 cm MV Decel Time: 305 msec MV E velocity: 146.00 cm/s MV A velocity: 113.00 cm/s MV E/A ratio:  1.29 MV A Prime:    9.7  cm/s Jodelle Red MD Electronically signed by Jodelle Red MD Signature Date/Time: 12/15/2022/11:40:21 AM    Final    CT ANGIO HEAD NECK W WO CM W PERF (CODE STROKE)  Addendum Date: 12/15/2022   ADDENDUM REPORT: 12/15/2022 07:46 ADDENDUM: Study discussed by telephone with Dr. Jalene Mullet on 12/15/2022 at 0727 hours. Electronically Signed   By: Odessa Fleming M.D.   On: 12/15/2022 07:46   Result Date: 12/15/2022 CLINICAL DATA:  82 year old male code stroke presentation. Reportedly right side deficits. EXAM: CT ANGIOGRAPHY HEAD AND NECK CT PERFUSION BRAIN TECHNIQUE: Multidetector CT imaging of the head and neck was performed using the standard protocol during bolus administration of intravenous contrast. Multiplanar CT image reconstructions and MIPs were obtained to evaluate the vascular anatomy. Carotid stenosis measurements (when applicable) are obtained utilizing NASCET criteria, using the distal internal carotid diameter as the denominator. Multiphase CT imaging of the brain was performed following IV bolus contrast injection. Subsequent parametric perfusion maps were calculated using RAPID software. RADIATION DOSE REDUCTION: This exam was performed according to the departmental dose-optimization program which includes automated exposure control, adjustment of the mA and/or kV according to patient size and/or use of iterative reconstruction technique. CONTRAST:  OMNIPAQUE IOHEXOL 350 MG/ML SOLN COMPARISON:  None Available. Head CT at the same time reported separately. FINDINGS: CT Brain Perfusion Findings: ASPECTS: 10 CBF (<30%) Volume: 0mL Perfusion (Tmax>6.0s) volume: 0mL No CT Perfusion parameter abnormalities detected. Mismatch Volume: Not applicable Infarction Location:Not applicable CTA NECK Skeleton: Previous sternotomy. Mild for age cervical spine degeneration. No acute osseous abnormality identified. Upper chest: Layering pleural effusions, at least moderate and greater on the left.  Additional patchy peribronchial opacity in the left upper lobe is confluent and nonspecific. No superior mediastinal lymphadenopathy. Furthermore, Difficult to exclude a left upper lobe pulmonary embolus on series 7, image 360. No visible saddle embolus. Other neck: No acute finding. Aortic arch: Calcified aortic atherosclerosis. 3 vessel arch. Right carotid system: Brachiocephalic artery is tortuous. Tortuous right CCA origin but no plaque or stenosis. Calcified plaque at the right ICA origin with less than 50 % stenosis with respect to the distal vessel. Left carotid system: Tortuous left CCA at the level of the thyroid but no CCA plaque or stenosis. Minimal calcified plaque at the left carotid bifurcation without stenosis. Vertebral arteries: Tortuous proximal right subclavian artery with mild calcified plaque at the origin. Right vertebral artery origin is normal. Non dominant right vertebral artery is patent and normal to the skull base. Proximal left subclavian origin minimal plaque and mild tortuosity. Calcified plaque adjacent to the left vertebral artery origin but no significant stenosis on series 7, image 275. Tortuous left V1 segment with additional soft and calcified plaque but no significant stenosis. Dominant left vertebral artery otherwise patent and normal to the skull base. CTA HEAD Posterior circulation: Non dominant right vertebral artery terminates in PICA. Right V4 segment supplies the basilar. There is mild to moderate right V4 circumferential calcified atherosclerosis on series 7, image 144 but no significant stenosis. Patent basilar artery without stenosis. Patent SCA and PCA origins, normal aside from mild basilar tip ectasia. Posterior communicating arteries are diminutive or absent. Left PCA branches are within normal limits. Right PCA branches are within normal limits. Anterior circulation: Both ICA siphons are patent. Left siphon calcified plaque is mild until the supraclinoid segment  where moderate calcified plaque and moderate to severe stenosis are noted on series 7, image 109, series 109, image 160. Patent left ICA terminus is mildly ectatic. No discrete terminus aneurysm. Contralateral right ICA siphon moderate calcified plaque but only mild right siphon stenosis. Patent carotid terminus. Patent MCA and ACA origins. Dominant right ACA A1. Normal anterior communicating artery. Bilateral ACA branches are within normal limits. Right MCA M1 segment and bifurcation are patent without stenosis. Left MCA M1 segment and bifurcation are patent without stenosis. Left MCA branches appear patent and within normal limits. Right MCA branches are within normal limits. Venous sinuses: Patent. Anatomic variants: Dominant left vertebral artery supplies the basilar, diminutive right vertebral terminates in PICA. Dominant right ACA 81. Review of the MIP images confirms the above findings IMPRESSION: 1. Negative CT Perfusion. CTA is negative for large vessel occlusion. 2. Generalized arterial tortuosity. Mildly ectatic basilar artery tip, left ICA terminus. Atherosclerosis is most pronounced at the ICA siphons, and on the left there is moderate to severe supraclinoid Left ICA stenosis due to calcified plaque. No other hemodynamically significant stenosis. 3. Difficult to exclude a left upper lobe pulmonary embolus on series 7, image 360. No visible saddle embolus. But partially visible layering pleural effusions, at least moderate and greater on the left, and with superimposed nonspecific Left upper lobe peribronchial opacity. Consider infection, aspiration,  tumor. 4.  Aortic Atherosclerosis (ICD10-I70.0). Electronically Signed: By: Odessa Fleming M.D. On: 12/15/2022 07:26   CT HEAD CODE STROKE WO CONTRAST  Addendum Date: 12/15/2022   ADDENDUM REPORT: 12/15/2022 07:29 ADDENDUM: Study discussed by telephone with Dr. Jalene Mullet in 12/15/2022 at 0710 hours. Electronically Signed   By: Odessa Fleming M.D.   On: 12/15/2022 07:29    Result Date: 12/15/2022 CLINICAL DATA:  Code stroke. 82 year old male last known well at 0400 hours with left gaze, right weakness. EXAM: CT HEAD WITHOUT CONTRAST TECHNIQUE: Contiguous axial images were obtained from the base of the skull through the vertex without intravenous contrast. RADIATION DOSE REDUCTION: This exam was performed according to the departmental dose-optimization program which includes automated exposure control, adjustment of the mA and/or kV according to patient size and/or use of iterative reconstruction technique. COMPARISON:  Head CT 11/04/2010. FINDINGS: Brain: Cerebral volume is within normal limits for age. No acute intracranial hemorrhage identified. No midline shift, mass effect, or evidence of intracranial mass lesion. No ventriculomegaly. No cortically based acute infarct identified. And gray-white differentiation is frequently normal for age throughout the brain. Mild to moderate right superior frontal lobe white matter hypodensity. Only mild left hemisphere white matter changes. Vascular: Extensive Calcified atherosclerosis at the skull base. Tortuosity. No suspicious intracranial vascular hyperdensity. Skull: Intact.  No acute osseous abnormality identified. Sinuses/Orbits: Visualized paranasal sinuses and mastoids are stable and well aerated. Other: No gaze deviation at this time. Negative visible scalp soft tissues. ASPECTS [facility] Stroke Program Early CT Score) Total score (0-10 with 10 being normal): 10 IMPRESSION: 1. No intracranial hemorrhage and largely normal for age noncontrast CT appearance of the brain. ASPECTS 10. 2. Intracranial artery tortuosity and calcified atherosclerosis. Electronically Signed: By: Odessa Fleming M.D. On: 12/15/2022 07:08     Medications:    levETIRAcetam Stopped (12/15/22 2251)   piperacillin-tazobactam (ZOSYN)  IV Stopped (12/16/22 0131)   sodium bicarbonate 150 mEq in dextrose 5 % 1,150 mL infusion 75 mL/hr at 12/16/22 0700    sodium  chloride   Intravenous Once   atorvastatin  20 mg Oral Daily   Chlorhexidine Gluconate Cloth  6 each Topical Daily   midodrine  10 mg Oral Q8H   pantoprazole (PROTONIX) IV  40 mg Intravenous Q12H   sodium chloride flush  10 mL Intravenous Q12H   sodium chloride flush  10-40 mL Intracatheter Q12H   tamsulosin  0.4 mg Oral QPC supper   acetaminophen **OR** acetaminophen, HYDROcodone-acetaminophen, ondansetron **OR** ondansetron (ZOFRAN) IV, mouth rinse, sodium chloride flush  Assessment/ Plan:  Mr. BORDEN RANDLETT Sr. is a 82 y.o.  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 Baseline creatinine 5.2 with GFR of 10 on 09/23/22.  -Creatinine currently appears to be below his prior baseline.  Therefore no acute indication for dialysis treatment at the moment.  Continue to monitor renal parameters closely.   Hypotension with volume loss:  -Patient currently off of pressors.  Monitor blood pressure trend.   Anemia with acute blood loss, iron deficiency and anemia of chronic kidney disease - status post PRBC transfusions - has not received ESA this admission.    LOS: 4 Terry Stephens 12/9/20247:51 AM

## 2022-12-16 NOTE — Progress Notes (Signed)
GI Inpatient Follow-up Note  Subjective:  Patient seen in follow-up for post-polypectomy bleed. No acute overnight events. Hemoglobin remains stable. No further GI bleeding. He is sitting upright in chair this morning. No new complaints.   Scheduled Inpatient Medications:   sodium chloride   Intravenous Once   atorvastatin  20 mg Oral Daily   Chlorhexidine Gluconate Cloth  6 each Topical Daily   midodrine  10 mg Oral Q8H   pantoprazole  40 mg Oral BID   sodium chloride flush  10 mL Intravenous Q12H   sodium chloride flush  10-40 mL Intracatheter Q12H   tamsulosin  0.4 mg Oral QPC supper    Continuous Inpatient Infusions:    sodium chloride     levETIRAcetam Stopped (12/15/22 2251)   piperacillin-tazobactam (ZOSYN)  IV 2.25 g (12/16/22 0802)    PRN Inpatient Medications:  acetaminophen **OR** acetaminophen, HYDROcodone-acetaminophen, ondansetron **OR** ondansetron (ZOFRAN) IV, mouth rinse, sodium chloride flush  Review of Systems: Constitutional: Weight is stable.  Eyes: No changes in vision. ENT: No oral lesions, sore throat.  GI: see HPI.  Heme/Lymph: No easy bruising.  CV: No chest pain.  GU: No hematuria.  Integumentary: No rashes.  Neuro: No headaches.  Psych: No depression/anxiety.  Endocrine: No heat/cold intolerance.  Allergic/Immunologic: No urticaria.  Resp: No cough, SOB.  Musculoskeletal: No joint swelling.    Physical Examination: BP 106/69   Pulse 78   Temp 98 F (36.7 C) (Oral)   Resp 18   Ht 5\' 9"  (1.753 m)   Wt 76.2 kg   SpO2 96%   BMI 24.81 kg/m  Gen: NAD, alert and oriented x 4 HEENT: PEERLA, EOMI, Neck: supple, no JVD or thyromegaly Chest: CTA bilaterally, no wheezes, crackles, or other adventitious sounds CV: RRR, no m/g/c/r Abd: soft, NT, ND, +BS in all four quadrants; no HSM, guarding, ridigity, or rebound tenderness Ext: no edema, well perfused with 2+ pulses, Skin: no rash or lesions noted Lymph: no LAD  Data: Lab Results   Component Value Date   WBC 6.0 12/16/2022   HGB 7.4 (L) 12/16/2022   HCT 22.4 (L) 12/16/2022   MCV 89.6 12/16/2022   PLT 94 (L) 12/16/2022   Recent Labs  Lab 12/15/22 0341 12/15/22 1405 12/16/22 0445  HGB 7.3* 7.7* 7.4*   Lab Results  Component Value Date   NA 140 12/16/2022   K 3.8 12/16/2022   CL 101 12/16/2022   CO2 30 12/16/2022   BUN 56 (H) 12/16/2022   CREATININE 3.72 (H) 12/16/2022   Lab Results  Component Value Date   ALT 8 12/13/2022   AST 15 12/13/2022   ALKPHOS 33 (L) 12/13/2022   BILITOT 0.9 12/13/2022   Recent Labs  Lab 12/14/22 0149  APTT 39*  INR 1.4*   CSY 12/13/2022 - 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, hematin found in entire colon, active bleeding seen in cecum 2/2 previous polypectomy procedure with three hemostatic clips placed, one 12 mm polyp found in sigmoid colon not removed, adherent post-polypectomy clips noted in ascending colon without stigmata of recent bleeding  Assessment/Plan:  82 y/o Caucasian male with a PMH of obesity, HTN, mixed HLD, PAF on chronic anticoagulation (held starting 12/2), CKD Stage V, CAD s/p CABG, hx of ascending aortic dissection s/p repair 1974, s/p bioprosthetic aortic valve, and SVT presented to the [facility] ED 12/5 for chief complaint of hematochezia c/w PPB s/p colonoscopy 12/6 with active bleeding in cecum requiring three hemostatic clips for endoscopic hemostasis.  GI following.    Post-polypectomy bleed (PPB) from cecum - resolved, no signs or symptoms of recurrent GI bleeding   Acute blood loss anemia - 2/2 PPB. Hemoglobin 7.4 this AM. Stable.   Code stroke - Work-up negative for CVA  Hypovolemic shock - improving, off pressors   Pneumonia   IDA/anemia of chronic disease  CAD s/p CABG  CKD Stage V  Chronic anticoagulation    Recommendations:  - Maintain 2 large bore IVs for access - Continue to monitor serial H&H. Goal hemoglobin 8.0 or higher. Transfuse PRN to keep  hemoglobin >8.0.  - Supportive care with IV fluid hydration, pain control, and antiemetics prn - Continue Protonix 40 mg BID for gastric protection  - No signs of ongoing GI bleeding at this time. Continue to monitor for s/s GIB. - No indication for repeat luminal evaluation at this time - GI available as needed for concerns of bleeding - ADAT - GI will sign off at this time and follow peripherally - If concerns for recurrent bleeding, please call Dr. Norma Fredrickson.   Please call with questions or concerns.   Jacob Moores, PA-C [facility] [phone]

## 2022-12-16 NOTE — Progress Notes (Addendum)
0730 Patient awake and alert. Able to quickly answer all NIH questions correctly. Up to recliner without issues.  10-1128 Up walking with OT then PT'. Tolerated activity well. 1200 8th unit of blood started.  1245 Patient had run of  rate 150. After valsalva maneuver his heart.rate returned to normal. 1530 EEG done. 1730 Patient had 2nd run of rapid heartrate 150.After instructing patient to do valsalva maneuver, patient's heart rate decreased to 80s. Dr Lucianne Muss informed of 2nd run of rapid heart rate. Orders received. 1830 patient dosing with TV on.

## 2022-12-16 NOTE — Evaluation (Addendum)
Physical Therapy Evaluation Patient Details Name: Terry KOKER Sr. MRN: [id number] DOB: 11-Mar-1940 Today's Date: 12/16/2022  History of Present Illness  Pt is a 82 y.o. male with medical history significant for CKD5, HTN, A-fib on Eliquis (last dose 12/2), CAD s/p CABG, ascending aortic dissection s/p repair 1974, bioprosthetic aortic valve, SVT on high-dose metoprolol succinate, history of syncope with prior loop recorder who presented to [facility] ED on 12/11/22 due to complaints of rectal bleeding that started several hours following Colonoscopy and polypectomy.  Clinical Impression  Pt is a pleasant 82 year old male who was admitted for colonoscopy with polypectomy on 12/4 after complications recovering after procedure. Pt is s/p multiple blood transfusions and is expected to get another transfusion this date. Pt performs bed mobility, transfers, and ambulation with cga and RW. Pt fatigues with increased distance. Very slow 5 time sit<>Stand demonstrating decreased power/strength. Educated to use RW at this time. Pt demonstrates deficits with endurance/mobility/strength. Would benefit from skilled PT to address above deficits and promote optimal return to PLOF. Pt will continue to receive skilled PT services while admitted and will defer to TOC/care team for updates regarding disposition planning.       If plan is discharge home, recommend the following: A little help with walking and/or transfers;Help with stairs or ramp for entrance   Can travel by private vehicle        Equipment Recommendations Rolling walker (2 wheels)  Recommendations for Other Services       Functional Status Assessment Patient has had a recent decline in their functional status and demonstrates the ability to make significant improvements in function in a reasonable and predictable amount of time.     Precautions / Restrictions Precautions Precautions: Fall Restrictions Weight Bearing Restrictions: No       Mobility  Bed Mobility Overal bed mobility: Needs Assistance Bed Mobility: Sit to Supine       Sit to supine: Contact guard assist   General bed mobility comments: Pt received seated at EOB upon arrival. At end of session, pt wished to return to bed. Needs cues for B LE management.    Transfers Overall transfer level: Needs assistance Equipment used: Rolling walker (2 wheels) Transfers: Sit to/from Stand Sit to Stand: Contact guard assist           General transfer comment: pulls up on RW despite cues. ONce standing, upright posture noted.    Ambulation/Gait Ambulation/Gait assistance: Contact guard assist Gait Distance (Feet): 80 Feet Assistive device: Rolling walker (2 wheels) Gait Pattern/deviations: Step-through pattern       General Gait Details: ambulated in hallway with reciprocal gait pattern and safe technique. RW used with adjustements made to height. Pt fatigues with exertion. All vitals WNL.  Stairs            Wheelchair Mobility     Tilt Bed    Modified Rankin (Stroke Patients Only)       Balance Overall balance assessment: Needs assistance Sitting-balance support: Feet supported Sitting balance-Leahy Scale: Good     Standing balance support: During functional activity, No upper extremity supported Standing balance-Leahy Scale: Fair                               Pertinent Vitals/Pain Pain Assessment Pain Assessment: Faces Faces Pain Scale: Hurts a little bit Pain Location: abdomen Pain Descriptors / Indicators: Discomfort Pain Intervention(s): Limited activity within patient's tolerance, Repositioned  Home Living Family/patient expects to be discharged to:: Private residence Living Arrangements: Spouse/significant other Available Help at Discharge: Family;Available PRN/intermittently Type of Home:  (condo) Home Access: Level entry       Home Layout: One level Home Equipment: None      Prior Function  Prior Level of Function : Independent/Modified Independent             Mobility Comments: does not use AD at baseline. No longer has RW. ADLs Comments: Ind with all ADL's and wife helps with IADL's     Extremity/Trunk Assessment   Upper Extremity Assessment Upper Extremity Assessment: Generalized weakness;Defer to OT evaluation    Lower Extremity Assessment Lower Extremity Assessment: Generalized weakness (B LE grossly 4/5)       Communication   Communication Communication: No apparent difficulties Cueing Techniques: Verbal cues  Cognition Arousal: Alert Behavior During Therapy: WFL for tasks assessed/performed Overall Cognitive Status: Within Functional Limits for tasks assessed                                 General Comments: very chatty        General Comments      Exercises Other Exercises Other Exercises: performed 5 time sit<>Stand in 43 seconds Other Exercises: Standing marching x 10 reps with RW for B UE support   Assessment/Plan    PT Assessment Patient needs continued PT services  PT Problem List Decreased strength;Decreased activity tolerance;Decreased balance;Decreased mobility;Cardiopulmonary status limiting activity;Decreased knowledge of use of DME       PT Treatment Interventions DME instruction;Stair training;Gait training;Therapeutic exercise    PT Goals (Current goals can be found in the Care Plan section)  Acute Rehab PT Goals Patient Stated Goal: to get strong enough to return home PT Goal Formulation: With patient Time For Goal Achievement: 12/30/22 Potential to Achieve Goals: Good    Frequency Min 1X/week     Co-evaluation               AM-PAC PT "6 Clicks" Mobility  Outcome Measure Help needed turning from your back to your side while in a flat bed without using bedrails?: A Little Help needed moving from lying on your back to sitting on the side of a flat bed without using bedrails?: A Little Help  needed moving to and from a bed to a chair (including a wheelchair)?: A Little Help needed standing up from a chair using your arms (e.g., wheelchair or bedside chair)?: A Little Help needed to walk in hospital room?: A Little Help needed climbing 3-5 steps with a railing? : A Lot 6 Click Score: 17    End of Session   Activity Tolerance: Patient tolerated treatment well Patient left: in bed;with family/visitor present Nurse Communication: Mobility status PT Visit Diagnosis: Unsteadiness on feet (R26.81);Muscle weakness (generalized) (M62.81);Difficulty in walking, not elsewhere classified (R26.2)    Time: 7253-6644 PT Time Calculation (min) (ACUTE ONLY): 21 min   Charges:   PT Evaluation $PT Eval Low Complexity: 1 Low PT Treatments $Gait Training: 8-22 mins PT General Charges $$ ACUTE PT VISIT: 1 Visit         Elizabeth Palau, PT, DPT, GCS [phone]   Alfie Rideaux 12/16/2022, 2:58 PM

## 2022-12-16 NOTE — Plan of Care (Signed)
  Problem: Education: Goal: Knowledge of General Education information will improve Description: Including pain rating scale, medication(s)/side effects and non-pharmacologic comfort measures 12/16/2022 0738 by Tacey Ruiz, RN Outcome: Progressing 12/16/2022 0717 by Tacey Ruiz, RN Outcome: Progressing   Problem: Clinical Measurements: Goal: Ability to maintain clinical measurements within normal limits will improve 12/16/2022 0738 by Tacey Ruiz, RN Outcome: Progressing 12/16/2022 0717 by Tacey Ruiz, RN Outcome: Progressing Goal: Will remain free from infection 12/16/2022 0738 by Tacey Ruiz, RN Outcome: Progressing 12/16/2022 0717 by Tacey Ruiz, RN Outcome: Progressing Goal: Diagnostic test results will improve 12/16/2022 0738 by Tacey Ruiz, RN Outcome: Progressing 12/16/2022 0717 by Tacey Ruiz, RN Outcome: Progressing Goal: Respiratory complications will improve Outcome: Progressing Goal: Cardiovascular complication will be avoided Outcome: Progressing   Problem: Elimination: Goal: Will not experience complications related to bowel motility Outcome: Progressing Goal: Will not experience complications related to urinary retention Outcome: Progressing   Problem: Activity: Goal: Risk for activity intolerance will decrease Outcome: Progressing   Problem: Pain Management: Goal: General experience of comfort will improve Outcome: Progressing   Problem: Safety: Goal: Ability to remain free from injury will improve Outcome: Progressing

## 2022-12-16 NOTE — Evaluation (Signed)
Occupational Therapy Evaluation Patient Details Name: Terry LIVA Sr. MRN: [id number] DOB: 26-Jun-1940 Today's Date: 12/16/2022   History of Present Illness Pt is a 82 y.o. male with medical history significant for CKD5, HTN, A-fib on Eliquis (last dose 12/2), CAD s/p CABG, ascending aortic dissection s/p repair 1974, bioprosthetic aortic valve, SVT on high-dose metoprolol succinate, history of syncope with prior loop recorder who presented to [facility] ED on 12/11/22 due to complaints of rectal bleeding that started several hours following Colonoscopy and polypectomy.   Clinical Impression   Pt was seen for OT evaluation this date. Prior to hospital admission, pt was Ind with all ADL's. Pt lives with wife who is able to provide A as needed. Pt presents to acute OT demonstrating impaired ADL performance and functional mobility 2/2 (See OT problem list for additional functional deficits). Upon entering room pt seated in recliner, agreeable to tx. Pt's wife and RN present during the session. Pt completed STS with CGA. Pt reported feeling some weakness once pt came up into standing, tolerated ~ 6 min no LOB noted. Pt requested a RW for stability. Pt does not use AD at baseline. Pt ambulated ~7ft into the hallway and back into room with CGA +RW. Pt requested getting back into bed. Pt completed bed mobility with CGA +use of rails. Pt left supine in bed with call bell within reach and RN in room. Pt would benefit from skilled OT services to address noted impairments and functional limitations (see below for any additional details) in order to maximize safety and independence while minimizing falls risk and caregiver burden. Anticipate the need for follow up OT services upon acute hospital DC.        If plan is discharge home, recommend the following: Assistance with cooking/housework;Assist for transportation;Help with stairs or ramp for entrance;A little help with bathing/dressing/bathroom    Functional  Status Assessment  Patient has had a recent decline in their functional status and demonstrates the ability to make significant improvements in function in a reasonable and predictable amount of time.  Equipment Recommendations  None recommended by OT    Recommendations for Other Services       Precautions / Restrictions Precautions Precautions: Fall Restrictions Weight Bearing Restrictions: No      Mobility Bed Mobility Overal bed mobility: Needs Assistance Bed Mobility: Sit to Supine       Sit to supine: Contact guard assist     Patient Response: Cooperative  Transfers Overall transfer level: Needs assistance Equipment used: Rolling walker (2 wheels) Transfers: Sit to/from Stand Sit to Stand: Contact guard assist                  Balance Overall balance assessment: Needs assistance Sitting-balance support: Feet supported Sitting balance-Leahy Scale: Good     Standing balance support: Bilateral upper extremity supported, During functional activity, Reliant on assistive device for balance Standing balance-Leahy Scale: Fair                             ADL either performed or assessed with clinical judgement   ADL Overall ADL's : Needs assistance/impaired                                     Functional mobility during ADLs: Contact guard assist;Cueing for safety;Rolling walker (2 wheels) General ADL Comments: Pt completed STS with CGA. Pt  reported feeling some weakness once pt came up into standing, tolerated ~5-55min no LOB noted. Pt requested a RW for stability. Pt does not use AD at baseline. Pt ambulated ~77ft into the hallway and back into room with CGA +RW.     Vision Baseline Vision/History: 1 Wears glasses Patient Visual Report: No change from baseline       Perception         Praxis         Pertinent Vitals/Pain Pain Assessment Pain Assessment: No/denies pain     Extremity/Trunk Assessment Upper Extremity  Assessment Upper Extremity Assessment: Generalized weakness   Lower Extremity Assessment Lower Extremity Assessment: Generalized weakness       Communication Communication Communication: No apparent difficulties Cueing Techniques: Verbal cues   Cognition Arousal: Alert Behavior During Therapy: WFL for tasks assessed/performed Overall Cognitive Status: Within Functional Limits for tasks assessed                                       General Comments       Exercises     Shoulder Instructions      Home Living Family/patient expects to be discharged to:: Private residence Living Arrangements: Spouse/significant other Available Help at Discharge: Family;Available PRN/intermittently Type of Home: House (Condo) Home Access: Ramped entrance     Home Layout: One level     Bathroom Shower/Tub: Walk-in shower         Home Equipment: Agricultural consultant (2 wheels)          Prior Functioning/Environment Prior Level of Function : Independent/Modified Independent             Mobility Comments: Does not use any AD at baseline; pt reports having a RW which pt used after a procedure ADLs Comments: Ind with all ADL's and wife helps with IADL's        OT Problem List: Decreased strength;Decreased range of motion;Decreased activity tolerance;Impaired balance (sitting and/or standing)      OT Treatment/Interventions: Self-care/ADL training;Therapeutic activities;Therapeutic exercise;Patient/family education;DME and/or AE instruction    OT Goals(Current goals can be found in the care plan section) Acute Rehab OT Goals Patient Stated Goal: to go home OT Goal Formulation: With patient/family Time For Goal Achievement: 12/30/22 Potential to Achieve Goals: Good  OT Frequency: Min 1X/week    Co-evaluation              AM-PAC OT "6 Clicks" Daily Activity     Outcome Measure Help from another person eating meals?: None Help from another person taking  care of personal grooming?: None Help from another person toileting, which includes using toliet, bedpan, or urinal?: A Little Help from another person bathing (including washing, rinsing, drying)?: A Little Help from another person to put on and taking off regular upper body clothing?: None Help from another person to put on and taking off regular lower body clothing?: A Lot 6 Click Score: 20   End of Session Equipment Utilized During Treatment: Rolling walker (2 wheels) Nurse Communication: Mobility status  Activity Tolerance: Patient tolerated treatment well Patient left: in bed;with call bell/phone within reach  OT Visit Diagnosis: Unsteadiness on feet (R26.81);Other abnormalities of gait and mobility (R26.89);Muscle weakness (generalized) (M62.81)                Time: 6045-4098 OT Time Calculation (min): 16 min Charges:     Butch Penny, SOT

## 2022-12-16 NOTE — Progress Notes (Signed)
[facility] Progress Note  Patient: Terry Stephens    QMV:[id number]  DOA: 12/12/2022     Date of Service: the patient was seen and examined on 12/16/2022  Chief Complaint  Patient presents with   Rectal Bleeding   Brief hospital course: Terry Altimus Harkins Sr. is a 82 y.o. male with PMH of CKD5, HTN, A-fib on Eliquis (last dose 12/2), CAD s/p CABG, ascending aortic dissection s/p repair 1974, bioprosthetic aortic valve, SVT on high-dose metoprolol succinate, history of syncope with prior loop recorder who presented to [facility] ED on 12/11/22 due to complaints of rectal bleeding that started several hours following Colonoscopy and polypectomy.  He underwent elective EGD and Colonoscopy on 12/11/22 for workup of iron deficiency anemia, which required snare polypectomy of 15 polyps with placement of multiple hemostatic clips. He reported that several hours after the procedure he felt like he was oozing blood and began passing dark clots. He endorsed mild lightheadedness but denies chest pain, shortness of breath or palpitations, but denied abdominal pain. Of note, patient stopped taking Eliquis since 12/2 preparation for his colonoscopy and has not yet resumed. He was however taking a baby aspirin as advised by his cardiologist who did preprocedural cardiac clearance.   Significant Hospital Events:  12/4: Underwent elective EGD and Colostomy for evaluation of IDA, with polypectomy.  Went home post procedure, however in the following hours at home, passing bright red blood and clots from rectum.  Went to ED for evaluation. 12/5: [facility] admitted. GI consulted. Received enema for repeat colonoscopy with loss of large amount of bright red blood per rectum with associated hypotension.  Was given fluids and 2 units blood, required low dose peripheral Levophed 12/6: Repeat Colonoscopy performed.  Late in the evening, again Hypotensive, given 1 unit of blood and placed on Levophed. PCCM consulted,  Central  line placed. 12/7 severe hypovolumic shock 12/8 code stroke was called due to left gaze and right-sided weakness, not a tPA candidate, MRI brain negative 12/9 patient was stabilized and transferred to [facility] service  Further management as below  Assessment and Plan:  # Hypovolemic shock due to acute blood loss anemia Patient was transferred to ICU for pressor support Levophed gradually weaned off Continue midodrine 10 mg p.o. 3 times daily with holding parameters Monitor BP and titrate medications accordingly  # Lower GI bleeding s/p polypectomy S/p repeat colonoscopy on 12/6 with active bleeding in cecum requiring three hemostatic clips for endoscopic hemostasis.  Continue pantoprazole 40 mg twice daily Started full liquid diet, advance as per tolerance Monitor H&H  # Acute blood loss anemia S/p multiple PRBC transfusions 12/9 Hb 7.4, transfuse 1 unit of PRBC to keep Hb >8 Monitor H&H and transfuse Hold Eliquis for now   # Transient left gaze and right-sided weakness, resolved, unknown cause CT head code stroke no ICH, normal for age, intracranial artery toxicity and calcified atherosclerosis. CTA head and neck: Negative CT perfusion and negative for large vessel occlusion. MRI brain: No acute intracranial process. No evidence of acute or subacute infarct. 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  Interventions:  Diet: Full liquid diet DVT Prophylaxis: SCD, pharmacological prophylaxis contraindicated due to active GI bleeding    Advance goals of care discussion: Full code  Family Communication: family was present at bedside, at the time of interview.  The pt provided permission to discuss medical plan with the family. Opportunity was given to ask question and all questions were answered satisfactorily.   Disposition:  Pt is from Home, admitted with GI bleeding, still has risk of bleeding and low Hb 1 unit PRBC transfusion today on 12/9, which precludes a safe discharge. Discharge to Home, when stable, most likely tomorrow a.m.  Subjective: No significant events overnight, patient was sitting only, had no BM yet, no active GI bleeding.  Denied any nausea vomiting.  No abdominal pain.  No chest pain or palpitation, no shortness of breath. Hemoglobin below 8, agreed for another blood transfusion.  Physical Exam: General: NAD, lying comfortably Appear in no distress, affect appropriate Eyes: PERRLA ENT: Oral Mucosa Clear, moist  Neck: no JVD,  Cardiovascular: S1 and S2 Present, no Murmur,  Respiratory: good respiratory effort, Bilateral Air entry equal and Decreased, no Crackles, no wheezes Abdomen: Bowel Sound present, Soft and no tenderness,  Skin: no rashes Extremities: no Pedal edema, no calf tenderness Neurologic: without any new focal findings Gait not checked due to patient safety concerns  Vitals:   12/16/22 1230 12/16/22 1245 12/16/22 1300 12/16/22 1450  BP: (!) 121/58 (!) 84/64 119/67 (!) 132/97  Pulse: 90 (!) 151 75 78  Resp: (!) 25 (!) 27 (!) 22 (!) 24  Temp: (!) 97.2 F (36.2 C)   97.8 F (36.6 C)  TempSrc: Oral   Oral  SpO2: 97% 98% 99%   Weight:      Height:        Intake/Output Summary (Last 24 hours) at 12/16/2022  1609 Last data filed at 12/16/2022 1450 Gross per 24 hour  Intake 2580.52 ml  Output 400 ml  Net 2180.52 ml   Filed Weights   12/11/22 2135  Weight: 76.2 kg    Data Reviewed: I have personally reviewed and interpreted daily labs, tele strips, imagings as discussed above. I reviewed all nursing notes, pharmacy notes, vitals, pertinent old records I have discussed plan of care as described above with RN and patient/family.  CBC: Recent Labs  Lab 12/11/22 2140 12/12/22 0418 12/13/22 1949 12/14/22 0149 12/14/22 0435 12/14/22 0554 12/14/22 2005 12/15/22 0151 12/15/22 0341 12/15/22 1405 12/16/22 0445  WBC 4.7   < > 7.6 12.8* 12.8*  --   --   --  8.0  --  6.0  NEUTROABS 2.8  --  6.0 10.2*  --   --   --   --   --   --   --   HGB 10.6*   < > 9.2* 7.3* 7.1*   < > 7.6* 7.5* 7.3* 7.7* 7.4*  HCT 34.7*   < > 27.4* 21.3* 20.7*   < > 22.8* 22.2* 21.6* 22.3* 22.4*  MCV 99.1   < > 89.3 87.3 85.9  --   --   --  87.4  --  89.6  PLT 154   < > 81* 102* 101*  --   --   --  89*  --  94*   < > = values in this interval not displayed.   Basic Metabolic Panel: Recent Labs  Lab 12/13/22 0537 12/13/22 1949 12/13/22 2212 12/14/22 0435 12/14/22 1007 12/14/22 2044 12/15/22 0341 12/16/22 0445  NA 145 145 147* 146*  --   --  141 140  K 3.9 6.0* 3.5 3.4*  --   --  4.0 3.8  CL 116* 118* 113* 112*  --   --  105 101  CO2 15* 21* 24 24  --   --  26 30  GLUCOSE 127* 191* 146* 127*  --   --  153* 118*  BUN 62* 48* 57* 57*  --   --  55* 56*  CREATININE 3.75* 3.15* 3.67* 3.85*  --   --  3.72* 3.72*  CALCIUM 6.7* 6.1* 6.6* 7.1*  --   --  7.2* 6.9*  MG 1.8  --   --   --  1.6*  --  2.2 2.0  PHOS  --   --   --   --   --  3.9 4.2 3.7    Studies: No results found.  Scheduled Meds:  [START ON 12/17/2022] amoxicillin-clavulanate  1 tablet Oral BID   atorvastatin  20 mg Oral Daily   Chlorhexidine Gluconate Cloth  6 each Topical Daily   midodrine  10 mg Oral TID WC   pantoprazole  40 mg Oral BID    sodium chloride flush  10 mL Intravenous Q12H   sodium chloride flush  10-40 mL Intracatheter Q12H   tamsulosin  0.4 mg Oral QPC supper   Continuous Infusions:  sodium chloride 75 mL/hr (12/16/22 1034)   levETIRAcetam 250 mg (12/16/22 1050)   piperacillin-tazobactam (ZOSYN)  IV 2.25 g (12/16/22 0802)   PRN Meds: acetaminophen **OR** acetaminophen, HYDROcodone-acetaminophen, ondansetron **OR** ondansetron (ZOFRAN) IV, mouth rinse, sodium chloride flush  Time spent: 55 minutes  Author: Gillis Santa. MD [facility] 12/16/2022 4:09 PM  To reach On-call, see care teams to locate the attending and reach out to them via [email or web address]. If 7PM-7AM, please contact night-coverage If you still have difficulty reaching the attending provider, please page the [facility] (Director on Call) for [facility] on amion for assistance.

## 2022-12-16 NOTE — Progress Notes (Signed)
NEUROLOGY CONSULT FOLLOW UP NOTE   Date of service: December 16, 2022 Patient Name: Terry VANNELLI Sr. MRN:  [id number] DOB:  June 18, 1940    Interval Hx/subjective  Seen and examined. Offers no new complaints.  Vitals   Vitals:   12/16/22 0439 12/16/22 0500 12/16/22 0600 12/16/22 0700  BP:  111/72 123/88 (!) 101/58  Pulse: 76 73 66 72  Resp: (!) 21 19 16 15   Temp:      TempSrc:      SpO2: 98% 98% 98% 97%  Weight:      Height:         Body mass index is 24.81 kg/m.  Physical Exam  Awake alert in no distress Normocephalic atraumatic Lungs clear Cardiovascular: Irregularly irregular Extremities warm well-perfused Neurological exam Awake alert oriented x 3 Mildly diminished attention concentration No aphasia No dysarthria Cranial nerves II to XII intact Motor examination with no drift in any of the 4 extremities Sensation intact light touch Coordination examination reveals no dysmetria  Medications  Current Facility-Administered Medications:    0.9 %  sodium chloride infusion (Manually program via Guardrails IV Fluids), , Intravenous, Once, Dahlia Byes, NP   0.9 %  sodium chloride infusion (Manually program via Guardrails IV Fluids), , Intravenous, Once, Gillis Santa, MD   acetaminophen (TYLENOL) tablet 650 mg, 650 mg, Oral, Q6H PRN, 650 mg at 12/16/22 0439 **OR** acetaminophen (TYLENOL) suppository 650 mg, 650 mg, Rectal, Q6H PRN, Andris Baumann, MD   atorvastatin (LIPITOR) tablet 20 mg, 20 mg, Oral, Daily, [name] V, MD, 20 mg at 12/15/22 1802   Chlorhexidine Gluconate Cloth 2 % PADS 6 each, 6 each, Topical, Daily, Leeroy Bock, MD, 6 each at 12/15/22 1000   HYDROcodone-acetaminophen (NORCO/VICODIN) 5-325 MG per tablet 1-2 tablet, 1-2 tablet, Oral, Q4H PRN, Andris Baumann, MD   [COMPLETED] levETIRAcetam (KEPPRA) IVPB 1000 mg/100 mL premix, 1,000 mg, Intravenous, Once, Stopped at 12/15/22 1900 **FOLLOWED BY** levETIRAcetam (KEPPRA) 250 mg in  sodium chloride 0.9 % 100 mL IVPB, 250 mg, Intravenous, Q12H, Jefferson Fuel, MD, Stopped at 12/15/22 2251   midodrine (PROAMATINE) tablet 10 mg, 10 mg, Oral, Q8H, Dahlia Byes, NP, 10 mg at 12/16/22 0500   ondansetron (ZOFRAN) tablet 4 mg, 4 mg, Oral, Q6H PRN **OR** ondansetron (ZOFRAN) injection 4 mg, 4 mg, Intravenous, Q6H PRN, [name] V, MD, 4 mg at 12/12/22 2243   Oral care mouth rinse, 15 mL, Mouth Rinse, PRN, Leeroy Bock, MD   pantoprazole (PROTONIX) injection 40 mg, 40 mg, Intravenous, Q12H, Kasa, Kurian, MD, 40 mg at 12/15/22 2044   piperacillin-tazobactam (ZOSYN) IVPB 2.25 g, 2.25 g, Intravenous, Q8H, Bari Mantis A, RPH, Last Rate: 100 mL/hr at 12/16/22 0802, 2.25 g at 12/16/22 0802   sodium bicarbonate 150 mEq in dextrose 5 % 1,150 mL infusion, , Intravenous, Continuous, Kolluru, Sarath, MD, Last Rate: 75 mL/hr at 12/16/22 0700, Infusion Verify at 12/16/22 0700   sodium chloride flush (NS) 0.9 % injection 10 mL, 10 mL, Intravenous, Q12H, Willy Eddy, MD, 10 mL at 12/15/22 2052   sodium chloride flush (NS) 0.9 % injection 10-40 mL, 10-40 mL, Intracatheter, Q12H, Harlon Ditty D, NP, 10 mL at 12/15/22 2052   sodium chloride flush (NS) 0.9 % injection 10-40 mL, 10-40 mL, Intracatheter, PRN, Harlon Ditty D, NP   tamsulosin [facility]) capsule 0.4 mg, 0.4 mg, Oral, QPC supper, [name] V, MD, 0.4 mg at 12/15/22 1802 Labs and Diagnostic Imaging   CBC:  Recent Labs  Lab 12/13/22 1949 12/14/22 0149 12/14/22 0435 12/15/22 0341 12/15/22 1405 12/16/22 0445  WBC 7.6 12.8*   < > 8.0  --  6.0  NEUTROABS 6.0 10.2*  --   --   --   --   HGB 9.2* 7.3*   < > 7.3* 7.7* 7.4*  HCT 27.4* 21.3*   < > 21.6* 22.3* 22.4*  MCV 89.3 87.3   < > 87.4  --  89.6  PLT 81* 102*   < > 89*  --  94*   < > = values in this interval not displayed.    Basic Metabolic Panel:  Lab Results  Component Value Date   NA 140 12/16/2022   K 3.8 12/16/2022   CO2 30 12/16/2022    GLUCOSE 118 (H) 12/16/2022   BUN 56 (H) 12/16/2022   CREATININE 3.72 (H) 12/16/2022   CALCIUM 6.9 (L) 12/16/2022   GFRNONAA 15 (L) 12/16/2022   GFRAA (L) 07/01/2006    51        The eGFR has been calculated using the MDRD equation. This calculation has not been validated in all clinical   INR  Lab Results  Component Value Date   INR 1.4 (H) 12/14/2022   APTT  Lab Results  Component Value Date   APTT 39 (H) 12/14/2022     MRI Brain(Personally reviewed): No acute changes.   rEEG:  pending  Assessment   Terry VILLARI Sr. is a 82 y.o. male past history of hypertension hyperlipidemia coronary artery disease status post CABG, ascending aortic dissection status postrepair in 1974, bioprosthetic aortic valve, SVT, A-fib on Eliquis that was held for colonoscopy admitted for subsequent GI bleed after colonoscopy and an inpatient code stroke was called because of left gaze deviation and right-sided weakness which resolved quickly.  He had mild dysarthria on my colleagues exam yesterday.  Today he has a NIH stroke scale of 0 with no deficits. MRI brain, CT and CT angiography head and neck with no acute findings makes seizure as likely etiology of his gaze deviation and weakness.  A small infarct could have been the etiology but the MRI does not reveal any evidence of cerebral infarction. Given that the clinical picture was not very clear for seizure and he had symptomatic anemia due to acute blood loss, this could also have been a brief hypoperfusion event.  MRI negative for acute process EEG pending-if negative, will not commit him to long-term antiepileptic treatment   Recommendations  EEG pending If negative, no need for antiepileptic Medical management per primary team as you are Anticoagulation.  Prevention when okay with GI. Discussed with Dr. Lucianne Muss.  ______________________________________________________________________   Signed, Milon Dikes, MD [facility]

## 2022-12-17 ENCOUNTER — Telehealth: Payer: Self-pay | Admitting: Cardiovascular Disease

## 2022-12-17 ENCOUNTER — Other Ambulatory Visit: Payer: Self-pay

## 2022-12-17 DIAGNOSIS — K91840 Postprocedural hemorrhage and hematoma of a digestive system organ or structure following a digestive system procedure: Secondary | ICD-10-CM | POA: Diagnosis not present

## 2022-12-17 DIAGNOSIS — I4891 Unspecified atrial fibrillation: Secondary | ICD-10-CM | POA: Diagnosis not present

## 2022-12-17 DIAGNOSIS — R569 Unspecified convulsions: Secondary | ICD-10-CM

## 2022-12-17 LAB — HEPARIN LEVEL (UNFRACTIONATED)
Heparin Unfractionated: <0.1 [IU]/mL — ABNORMAL LOW (ref 0.30–0.70)
Heparin Unfractionated: 0.15 [IU]/mL — ABNORMAL LOW (ref 0.30–0.70)

## 2022-12-17 LAB — CBC
HCT: 25.2 % — ABNORMAL LOW (ref 39.0–52.0)
Hemoglobin: 8.5 g/dL — ABNORMAL LOW (ref 13.0–17.0)
MCH: 30.4 pg (ref 26.0–34.0)
MCHC: 33.7 g/dL (ref 30.0–36.0)
MCV: 90 fL (ref 80.0–100.0)
Platelets: 101 10*3/uL — ABNORMAL LOW (ref 150–400)
RBC: 2.8 MIL/uL — ABNORMAL LOW (ref 4.22–5.81)
RDW: 16.9 % — ABNORMAL HIGH (ref 11.5–15.5)
WBC: 5.6 10*3/uL (ref 4.0–10.5)
nRBC: 0 % (ref 0.0–0.2)

## 2022-12-17 LAB — TYPE AND SCREEN
ABO/RH(D): O POS
Antibody Screen: NEGATIVE
Unit division: 0

## 2022-12-17 LAB — BASIC METABOLIC PANEL
Anion gap: 8 (ref 5–15)
BUN: 46 mg/dL — ABNORMAL HIGH (ref 8–23)
CO2: 29 mmol/L (ref 22–32)
Calcium: 6.8 mg/dL — ABNORMAL LOW (ref 8.9–10.3)
Chloride: 102 mmol/L (ref 98–111)
Creatinine, Ser: 3.74 mg/dL — ABNORMAL HIGH (ref 0.61–1.24)
GFR, Estimated: 15 mL/min — ABNORMAL LOW (ref >60)
Glucose, Bld: 87 mg/dL (ref 70–99)
Potassium: 3.9 mmol/L (ref 3.5–5.1)
Sodium: 139 mmol/L (ref 135–145)

## 2022-12-17 LAB — BPAM RBC
Blood Product Expiration Date: 202501032359
ISSUE DATE / TIME: 202412091155
Unit Type and Rh: 5100

## 2022-12-17 LAB — LEGIONELLA PNEUMOPHILA SEROGP 1 UR AG: L. pneumophila Serogp 1 Ur Ag: NEGATIVE

## 2022-12-17 LAB — PROTIME-INR
INR: 1.2 (ref 0.8–1.2)
Prothrombin Time: 15.3 s — ABNORMAL HIGH (ref 11.4–15.2)

## 2022-12-17 LAB — CALCIUM, IONIZED: Calcium, Ionized, Serum: 4.5 mg/dL (ref 4.5–5.6)

## 2022-12-17 LAB — APTT: aPTT: 36 s (ref 24–36)

## 2022-12-17 LAB — PHOSPHORUS: Phosphorus: 3.6 mg/dL (ref 2.5–4.6)

## 2022-12-17 LAB — MAGNESIUM: Magnesium: 1.9 mg/dL (ref 1.7–2.4)

## 2022-12-17 MED ORDER — FUROSEMIDE 10 MG/ML IJ SOLN
20.0000 mg | Freq: Once | INTRAMUSCULAR | Status: AC
  Administered 2022-12-17: 20 mg via INTRAVENOUS
  Filled 2022-12-17: qty 2

## 2022-12-17 MED ORDER — HEPARIN (PORCINE) 25000 UT/250ML-% IV SOLN
1100.0000 [IU]/h | INTRAVENOUS | Status: AC
  Administered 2022-12-17: 1000 [IU]/h via INTRAVENOUS
  Administered 2022-12-18: 1150 [IU]/h via INTRAVENOUS
  Filled 2022-12-17 (×2): qty 250

## 2022-12-17 MED ORDER — FUROSEMIDE 20 MG PO TABS
40.0000 mg | ORAL_TABLET | Freq: Every day | ORAL | Status: DC
  Administered 2022-12-17: 40 mg via ORAL
  Filled 2022-12-17: qty 2

## 2022-12-17 NOTE — Progress Notes (Signed)
[facility] Progress Note  Patient: Terry Stephens    ZOX:[id number]  DOA: 12/12/2022     Date of Service: the patient was seen and examined on 12/17/2022  Chief Complaint  Patient presents with   Rectal Bleeding   Brief hospital course: Nellie Jochum Gullo Sr. is a 82 y.o. male with PMH of CKD5, HTN, A-fib on Eliquis (last dose 12/2), CAD s/p CABG, ascending aortic dissection s/p repair 1974, bioprosthetic aortic valve, SVT on high-dose metoprolol succinate, history of syncope with prior loop recorder who presented to [facility] ED on 12/11/22 due to complaints of rectal bleeding that started several hours following Colonoscopy and polypectomy.  He underwent elective EGD and Colonoscopy on 12/11/22 for workup of iron deficiency anemia, which required snare polypectomy of 15 polyps with placement of multiple hemostatic clips. He reported that several hours after the procedure he felt like he was oozing blood and began passing dark clots. He endorsed mild lightheadedness but denies chest pain, shortness of breath or palpitations, but denied abdominal pain. Of note, patient stopped taking Eliquis since 12/2 preparation for his colonoscopy and has not yet resumed. He was however taking a baby aspirin as advised by his cardiologist who did preprocedural cardiac clearance.   Significant Hospital Events:  12/4: Underwent elective EGD and Colostomy for evaluation of IDA, with polypectomy.  Went home post procedure, however in the following hours at home, passing bright red blood and clots from rectum.  Went to ED for evaluation. 12/5: [facility] admitted. GI consulted. Received enema for repeat colonoscopy with loss of large amount of bright red blood per rectum with associated hypotension.  Was given fluids and 2 units blood, required low dose peripheral Levophed 12/6: Repeat Colonoscopy performed.  Late in the evening, again Hypotensive, given 1 unit of blood and placed on Levophed. PCCM consulted,  Central  line placed. 12/7 severe hypovolumic shock 12/8 code stroke was called due to left gaze and right-sided weakness, not a tPA candidate, MRI brain negative 12/9 patient was stabilized and transferred to [facility] service  Further management as below  Assessment and Plan:  # Hypovolemic shock due to acute blood loss anemia Patient was transferred to ICU for pressor support Levophed gradually weaned off Continue midodrine 10 mg p.o. 3 times daily with holding parameters Monitor BP and titrate medications accordingly  # Lower GI bleeding s/p polypectomy S/p repeat colonoscopy on 12/6 with active bleeding in cecum requiring three hemostatic clips for endoscopic hemostasis.  Continue pantoprazole 40 mg twice daily S/p full liquid diet, advance to soft diet Monitor H&H  # Acute blood loss anemia S/p multiple PRBC transfusions 12/9 Hb 7.4, transfuse 1 unit of PRBC to keep Hb >8 12/10 Hb 8.5, no GI bleeding Monitor H&H and transfuse Hold Eliquis for now   # Transient left gaze and right-sided weakness, resolved, unknown cause CT head code stroke no ICH, normal for age, intracranial artery toxicity and calcified atherosclerosis. CTA head and neck: Negative CT perfusion and negative for large vessel occlusion. MRI brain: No acute intracranial process. No evidence of acute or subacute infarct. S/p Keppra 250 IV every 12 hourly. EEG negative. Neurology consulted, recommended no need of AED, DC'd Keppra.  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   # Left upper lobe peribronchial opacity Incidental finding on CTA head and neck Recommend to follow-up with PCP and pulmonary as an outpatient   Body mass index is 24.81 kg/m.  Interventions:  Diet: Soft diet DVT Prophylaxis: Therapeutic Anticoagulation with heparin IV infusion    Advance goals of care discussion: Full code  Family Communication: family was present at bedside, at the time of interview.  The pt provided permission to discuss medical plan with the family. Opportunity was given to ask question and all questions were answered satisfactorily.   Disposition:  Pt is from Home, admitted with GI bleeding, still has risk of bleeding, started heparin IV infusion and plan is to transition to Eliquis tomorrow a.m. if no bleeding.  Significant edema, started Lasix Discharge to Home, when stable, most likely in 2 to 3 days  Subjective: No significant events overnight, patient was c/o swelling of right arm and genitalia.  Denies any GI bleeding, had no BM yet.  Denies any abdominal pain, no chest pain or palpitation, no shortness of breath. Patient and family agreed to start heparin IV infusion and then transition to Eliquis tomorrow a.m. if no bleeding. We will continue to diurese as per nephrology.   Physical Exam: General: NAD, lying comfortably Appear in no distress, affect appropriate Eyes: PERRLA ENT: Oral Mucosa Clear, moist  Neck: no JVD,  Cardiovascular: S1 and S2 Present, no Murmur,  Respiratory: good respiratory effort, Bilateral Air entry equal and Decreased, no Crackles, no wheezes Abdomen: Bowel Sound present, Soft and no tenderness, edematous genitalia Skin: no  rashes Extremities: no Pedal edema, no calf tenderness.  Right forearm edema, keep arm elevated Neurologic: without any new focal findings Gait not checked due to patient safety concerns  Vitals:   12/17/22 0800 12/17/22 0900 12/17/22 1000 12/17/22 1100  BP: 132/82 132/69 92/67 106/73  Pulse: 91 88 92 92  Resp: 19 (!) 23 (!) 25 (!) 23  Temp:      TempSrc:      SpO2: 95% 95% 99% 94%  Weight:      Height:        Intake/Output Summary (Last 24 hours) at 12/17/2022 1353 Last data filed at 12/17/2022 1338 Gross per 24 hour  Intake 2727.69 ml  Output 1351 ml  Net 1376.69 ml   Filed Weights   12/11/22 2135  Weight: 76.2 kg    Data Reviewed: I have personally reviewed and interpreted daily labs, tele strips, imagings as discussed above. I reviewed all nursing notes, pharmacy notes, vitals, pertinent old records I have discussed plan of care as described above with RN and patient/family.  CBC: Recent Labs  Lab 12/11/22 2140 12/12/22 0418 12/13/22 1949 12/14/22 0149 12/14/22 0435 12/14/22 0554 12/15/22 0151 12/15/22 0341 12/15/22 1405 12/16/22 0445 12/17/22 0507  WBC 4.7   < > 7.6 12.8* 12.8*  --   --  8.0  --  6.0 5.6  NEUTROABS 2.8  --  6.0 10.2*  --   --   --   --   --   --   --  HGB 10.6*   < > 9.2* 7.3* 7.1*   < > 7.5* 7.3* 7.7* 7.4* 8.5*  HCT 34.7*   < > 27.4* 21.3* 20.7*   < > 22.2* 21.6* 22.3* 22.4* 25.2*  MCV 99.1   < > 89.3 87.3 85.9  --   --  87.4  --  89.6 90.0  PLT 154   < > 81* 102* 101*  --   --  89*  --  94* 101*   < > = values in this interval not displayed.   Basic Metabolic Panel: Recent Labs  Lab 12/13/22 0537 12/13/22 1949 12/13/22 2212 12/14/22 0435 12/14/22 1007 12/14/22 2044 12/15/22 0341 12/16/22 0445 15-Jan-2023 0507  NA 145   < > 147* 146*  --   --  141 140 139  K 3.9   < > 3.5 3.4*  --   --  4.0 3.8 3.9  CL 116*   < > 113* 112*  --   --  105 101 102  CO2 15*   < > 24 24  --   --  26 30 29   GLUCOSE 127*   < > 146* 127*  --   --   153* 118* 87  BUN 62*   < > 57* 57*  --   --  55* 56* 46*  CREATININE 3.75*   < > 3.67* 3.85*  --   --  3.72* 3.72* 3.74*  CALCIUM 6.7*   < > 6.6* 7.1*  --   --  7.2* 6.9* 6.8*  MG 1.8  --   --   --  1.6*  --  2.2 2.0 1.9  PHOS  --   --   --   --   --  3.9 4.2 3.7 3.6   < > = values in this interval not displayed.    Studies: EEG adult  Result Date: 2023/01/15 Charlsie Quest, MD     2023-01-15 10:21 AM Patient Name: Dionne Ano Mella Sr. MRN: [id number] Epilepsy Attending: Charlsie Quest Referring Physician/Provider: Milon Dikes, MD Date: 12/16/2022 Duration: 31.04 mins  Patient history: 82yo M with left gaze deviation and right-sided weakness which resolved quickly. EEG to evaluate for seizure  Level of alertness: Awake, asleep  AEDs during EEG study: None  Technical aspects: This EEG study was done with scalp electrodes positioned according to the 10-20 International system of electrode placement. Electrical activity was reviewed with band pass filter of 1-70Hz , sensitivity of 7 uV/mm, display speed of 9mm/sec with a 60Hz  notched filter applied as appropriate. EEG data were recorded continuously and digitally stored.  Video monitoring was available and reviewed as appropriate.  Description: The posterior dominant rhythm consists of 8 Hz activity of moderate voltage (25-35 uV) seen predominantly in posterior head regions, symmetric and reactive to eye opening and eye closing. Sleep was characterized by vertex waves, sleep spindles (12 to 14 Hz), maximal frontocentral region. Hyperventilation and photic stimulation were not performed.    IMPRESSION: This study is within normal limits. No seizures or epileptiform discharges were seen throughout the recording.  A normal interictal EEG does not exclude the diagnosis of epilepsy.   Priyanka Annabelle Harman     Scheduled Meds:  amoxicillin-clavulanate  1 tablet Oral BID   atorvastatin  20 mg Oral Daily   Chlorhexidine Gluconate Cloth  6 each Topical Daily    midodrine  10 mg Oral TID WC   pantoprazole  40 mg Oral BID   sodium chloride flush  10 mL Intravenous Q12H   sodium chloride flush  10-40 mL Intracatheter Q12H   tamsulosin  0.4 mg Oral QPC supper   Continuous Infusions:  heparin 1,000 Units/hr (12/17/22 1338)   PRN Meds: acetaminophen **OR** acetaminophen, diltiazem, HYDROcodone-acetaminophen, ondansetron **OR** ondansetron (ZOFRAN) IV, mouth rinse, sodium chloride flush  Time spent: 55 minutes  Author: Gillis Santa. MD [facility] 12/17/2022 1:53 PM  To reach On-call, see care teams to locate the attending and reach out to them via [email or web address]. If 7PM-7AM, please contact night-coverage If you still have difficulty reaching the attending provider, please page the [facility] (Director on Call) for [facility] on amion for assistance.

## 2022-12-17 NOTE — Telephone Encounter (Signed)
Pt son called just to inform Dr. Mariah Milling pt is admitted.

## 2022-12-17 NOTE — Consult Note (Signed)
PHARMACY - ANTICOAGULATION CONSULT NOTE  Pharmacy Consult for IV Heparin Indication: atrial fibrillation  Patient Measurements: Height: 5\' 9"  (175.3 cm) Weight: 76.2 kg (168 lb) IBW/kg (Calculated) : 70.7 Heparin Dosing Weight: 76.2 kg  Labs: Recent Labs    12/15/22 0341 12/15/22 1405 12/16/22 0445 12/17/22 0507  HGB 7.3* 7.7* 7.4* 8.5*  HCT 21.6* 22.3* 22.4* 25.2*  PLT 89*  --  94* 101*  CREATININE 3.72*  --  3.72* 3.74*   Estimated Creatinine Clearance: 15 mL/min (A) (by C-G formula based on SCr of 3.74 mg/dL (H)).  Medical History: Past Medical History:  Diagnosis Date   A-fib (HCC)    Allergic rhinitis    CAD (coronary artery disease)    Chronic kidney disease    Hypertension    Medications:  Apixaban 2.5 mg BID PTA, last pt reported dose was 12/08/22  Assessment: 81 y/o M with history as above and including Afib on apixaban who is admitted with hemorrhagic shock in setting of lower GIB s/p polypectomy. No further bleeding at this point and H&H are stable. Plan is to re-start heparin, no bolus protocol.  Baseline aPTT 36, INR 1.2 and heparin level <0.1.  Goal of Therapy:  Heparin level 0.3 - 0.5 units/ml Monitor platelets by anticoagulation protocol: Yes   Plan:  --Start heparin 1000 units/hr, no bolus --Heparin level 8 hours from start --CBC daily per protocol, monitor closely given recent bleeding  Tressie Ellis 12/17/2022,12:09 PM

## 2022-12-17 NOTE — Procedures (Deleted)
Patient Name: Terry WYZYKOWSKI Sr.  MRN: [id number]  Epilepsy Attending: Charlsie Quest  Referring Physician/Provider: Milon Dikes, MD  Date: 12/16/2022 Duration: 31.04 mins  Patient history: 82yo M with left gaze deviation and right-sided weakness which resolved quickly. EEG to evaluate for seizure  Level of alertness: Awake, asleep  AEDs during EEG study: None  Technical aspects: This EEG study was done with scalp electrodes positioned according to the 10-20 International system of electrode placement. Electrical activity was reviewed with band pass filter of 1-70Hz , sensitivity of 7 uV/mm, display speed of 51mm/sec with a 60Hz  notched filter applied as appropriate. EEG data were recorded continuously and digitally stored.  Video monitoring was available and reviewed as appropriate.  Description: The posterior dominant rhythm consists of 8 Hz activity of moderate voltage (25-35 uV) seen predominantly in posterior head regions, symmetric and reactive to eye opening and eye closing. Sleep was characterized by vertex waves, sleep spindles (12 to 14 Hz), maximal frontocentral region. Hyperventilation and photic stimulation were not performed.     IMPRESSION: This study is within normal limits. No seizures or epileptiform discharges were seen throughout the recording.  A normal interictal EEG does not exclude the diagnosis of epilepsy.   Wallace Gappa Annabelle Harman

## 2022-12-17 NOTE — Plan of Care (Signed)
Patient alert and oriented. No complaints of pain or shortness of breath. Patient started on heparin drip today. Tolerating diet and using urinal with no complication. Left arm fistula intact with bruit and thrill. No visible bleeding during my shift. Updated family continue to assess.

## 2022-12-17 NOTE — Progress Notes (Signed)
NEUROLOGY CONSULT FOLLOW UP NOTE   Date of service: December 17, 2022 Patient Name: Terry BATTIEST Sr. MRN:  [id number] DOB:  [birth year]    Interval Hx/subjective  Seen and examined. Offers no new complaints. EEG completed yesterday   Vitals   Vitals:   12/17/22 0400 12/17/22 0500 12/17/22 0600 12/17/22 0700  BP: (!) 106/59 110/73 117/61 113/65  Pulse: 85 82 78 77  Resp: (!) 27 19 12 16   Temp: 97.7 F (36.5 C)   98 F (36.7 C)  TempSrc: Oral     SpO2: 96% 95% 92% 92%  Weight:      Height:         Body mass index is 24.81 kg/m.  Physical Exam  Awake alert in no distress Normocephalic atraumatic Lungs clear Cardiovascular: Irregularly irregular Extremities warm well-perfused Neurological exam Awake alert oriented x 3 Mildly diminished attention concentration No aphasia No dysarthria Cranial nerves II to XII intact Motor examination with no drift in any of the 4 extremities Sensation intact light touch Coordination examination reveals no dysmetria Neurological exam remains unchanged today  Medications  Current Facility-Administered Medications:    0.9 %  sodium chloride infusion, , Intravenous, Continuous, Lateef, Munsoor, MD, Last Rate: 75 mL/hr at 12/17/22 0755, Infusion Verify at 12/17/22 0755   acetaminophen (TYLENOL) tablet 650 mg, 650 mg, Oral, Q6H PRN, 650 mg at 12/16/22 0439 **OR** acetaminophen (TYLENOL) suppository 650 mg, 650 mg, Rectal, Q6H PRN, Andris Baumann, MD   amoxicillin-clavulanate (AUGMENTIN) 500-125 MG per tablet 1 tablet, 1 tablet, Oral, BID, Gillis Santa, MD   atorvastatin (LIPITOR) tablet 20 mg, 20 mg, Oral, Daily, [name] V, MD, 20 mg at 12/17/22 3474   Chlorhexidine Gluconate Cloth 2 % PADS 6 each, 6 each, Topical, Daily, Leeroy Bock, MD, 6 each at 12/15/22 1000   diltiazem (CARDIZEM) injection 10 mg, 10 mg, Intravenous, Q6H PRN, Gillis Santa, MD   HYDROcodone-acetaminophen (NORCO/VICODIN) 5-325 MG per tablet 1-2  tablet, 1-2 tablet, Oral, Q4H PRN, Andris Baumann, MD   [COMPLETED] levETIRAcetam (KEPPRA) IVPB 1000 mg/100 mL premix, 1,000 mg, Intravenous, Once, Stopped at 12/15/22 1900 **FOLLOWED BY** levETIRAcetam (KEPPRA) 250 mg in sodium chloride 0.9 % 100 mL IVPB, 250 mg, Intravenous, Q12H, Jefferson Fuel, MD, Stopped at 12/16/22 2350   midodrine (PROAMATINE) tablet 10 mg, 10 mg, Oral, TID WC, Gillis Santa, MD, 10 mg at 12/16/22 1619   ondansetron (ZOFRAN) tablet 4 mg, 4 mg, Oral, Q6H PRN **OR** ondansetron (ZOFRAN) injection 4 mg, 4 mg, Intravenous, Q6H PRN, [name] V, MD, 4 mg at 12/12/22 2243   Oral care mouth rinse, 15 mL, Mouth Rinse, PRN, Leeroy Bock, MD   pantoprazole (PROTONIX) EC tablet 40 mg, 40 mg, Oral, BID, Gillis Santa, MD, 40 mg at 12/17/22 0802   sodium chloride flush (NS) 0.9 % injection 10 mL, 10 mL, Intravenous, Q12H, Willy Eddy, MD, 10 mL at 12/16/22 2148   sodium chloride flush (NS) 0.9 % injection 10-40 mL, 10-40 mL, Intracatheter, Q12H, Harlon Ditty D, NP, 10 mL at 12/16/22 2148   sodium chloride flush (NS) 0.9 % injection 10-40 mL, 10-40 mL, Intracatheter, PRN, Harlon Ditty D, NP   tamsulosin [facility]) capsule 0.4 mg, 0.4 mg, Oral, QPC supper, [name] V, MD, 0.4 mg at 12/16/22 1916 Labs and Diagnostic Imaging   CBC: Hgb 8.5 today Recent Labs  Lab 12/13/22 1949 12/14/22 0149 12/14/22 0435 12/16/22 0445 12/17/22 0507  WBC 7.6 12.8*   < > 6.0 5.6  NEUTROABS 6.0 10.2*  --   --   --   HGB 9.2* 7.3*   < > 7.4* 8.5*  HCT 27.4* 21.3*   < > 22.4* 25.2*  MCV 89.3 87.3   < > 89.6 90.0  PLT 81* 102*   < > 94* 101*   < > = values in this interval not displayed.    Basic Metabolic Panel:  Creat stable    Latest Ref Rng & Units 12/17/2022    5:07 AM 12/16/2022    4:45 AM 12/15/2022    3:41 AM  BMP  Glucose 70 - 99 mg/dL 87  595  638   BUN 8 - 23 mg/dL 46  56  55   Creatinine 0.61 - 1.24 mg/dL 7.56  4.33  2.95   Sodium 135 - 145 mmol/L 139   140  141   Potassium 3.5 - 5.1 mmol/L 3.9  3.8  4.0   Chloride 98 - 111 mmol/L 102  101  105   CO2 22 - 32 mmol/L 29  30  26    Calcium 8.9 - 10.3 mg/dL 6.8  6.9  7.2     INR  Lab Results  Component Value Date   INR 1.4 (H) 12/14/2022   APTT  Lab Results  Component Value Date   APTT 39 (H) 12/14/2022     MRI Brain(Personally reviewed): No acute changes.   rEEG:  normal  Assessment   Terry GUZEK Sr. is a 82 y.o. male past history of hypertension hyperlipidemia coronary artery disease status post CABG, ascending aortic dissection status postrepair in 1974, bioprosthetic aortic valve, SVT, A-fib on Eliquis that was held for colonoscopy admitted for subsequent GI bleed after colonoscopy and an inpatient code stroke was called because of left gaze deviation and right-sided weakness which resolved quickly.  He had mild dysarthria on my colleagues exam yesterday.  Today he has a NIH stroke scale of 0 with no deficits. MRI brain, CT and CT angiography head and neck with no acute findings makes seizure as likely etiology of his gaze deviation and weakness.  A small infarct could have been the etiology but the MRI does not reveal any evidence of cerebral infarction. Given that the clinical picture was not very clear for seizure and he had symptomatic anemia due to acute blood loss, this could also have been a brief hypoperfusion event.  MRI negative for acute process EEG  negative    Recommendations  No need for antiepileptic Medical management per primary team as you are Anticoagulation for stroke  Prevention when okay with GI. Discussed with Dr. Lucianne Muss. Please call with questions as needed.  ______________________________________________________________________   Dene Gentry, MD [facility]

## 2022-12-17 NOTE — Plan of Care (Signed)

## 2022-12-17 NOTE — Consult Note (Signed)
PHARMACY - ANTICOAGULATION CONSULT NOTE  Pharmacy Consult for IV Heparin Indication: atrial fibrillation  Patient Measurements: Height: 5\' 9"  (175.3 cm) Weight: 76.2 kg (168 lb) IBW/kg (Calculated) : 70.7 Heparin Dosing Weight: 76.2 kg  Labs: Recent Labs    12/15/22 0341 12/15/22 1405 12/16/22 0445 12/17/22 0507 12/17/22 1148 12/17/22 2142  HGB 7.3* 7.7* 7.4* 8.5*  --   --   HCT 21.6* 22.3* 22.4* 25.2*  --   --   PLT 89*  --  94* 101*  --   --   APTT  --   --   --   --  36  --   LABPROT  --   --   --   --  15.3*  --   INR  --   --   --   --  1.2  --   HEPARINUNFRC  --   --   --   --  <0.10* 0.15*  CREATININE 3.72*  --  3.72* 3.74*  --   --    Estimated Creatinine Clearance: 15 mL/min (A) (by C-G formula based on SCr of 3.74 mg/dL (H)).  Medical History: Past Medical History:  Diagnosis Date   A-fib (HCC)    Allergic rhinitis    CAD (coronary artery disease)    Chronic kidney disease    Hypertension    Medications:  Apixaban 2.5 mg BID PTA, last pt reported dose was 12/08/22  Assessment: 82 y/o M with history as above and including Afib on apixaban who is admitted with hemorrhagic shock in setting of lower GIB s/p polypectomy. No further bleeding at this point and H&H are stable. Plan is to re-start heparin, no bolus protocol.  Baseline aPTT 36, INR 1.2 and heparin level <0.1.  12/10 21:42  HL 0.15  Goal of Therapy:  Heparin level 0.3 - 0.5 units/ml Monitor platelets by anticoagulation protocol: Yes   Plan:  --Increase heparin to 1150 units/hr, no bolus --Heparin level 8 hours from rate change --CBC daily per protocol, monitor closely given recent bleeding  Clovia Cuff, PharmD, BCPS 12/17/2022 10:18 PM

## 2022-12-17 NOTE — Procedures (Signed)
Patient Name: Terry WYZYKOWSKI Sr.  MRN: [id number]  Epilepsy Attending: Charlsie Quest  Referring Physician/Provider: Milon Dikes, MD  Date: 12/16/2022 Duration: 31.04 mins  Patient history: 82yo M with left gaze deviation and right-sided weakness which resolved quickly. EEG to evaluate for seizure  Level of alertness: Awake, asleep  AEDs during EEG study: None  Technical aspects: This EEG study was done with scalp electrodes positioned according to the 10-20 International system of electrode placement. Electrical activity was reviewed with band pass filter of 1-70Hz , sensitivity of 7 uV/mm, display speed of 51mm/sec with a 60Hz  notched filter applied as appropriate. EEG data were recorded continuously and digitally stored.  Video monitoring was available and reviewed as appropriate.  Description: The posterior dominant rhythm consists of 8 Hz activity of moderate voltage (25-35 uV) seen predominantly in posterior head regions, symmetric and reactive to eye opening and eye closing. Sleep was characterized by vertex waves, sleep spindles (12 to 14 Hz), maximal frontocentral region. Hyperventilation and photic stimulation were not performed.     IMPRESSION: This study is within normal limits. No seizures or epileptiform discharges were seen throughout the recording.  A normal interictal EEG does not exclude the diagnosis of epilepsy.   Terry Stephens

## 2022-12-17 NOTE — Progress Notes (Signed)
[facility]  ROUNDING NOTE   Subjective:   Renal function about the same today with a creatinine 3.7. Good urine output 1.2 L over the preceding 24 hours. Patient awake and alert, and conversant. 12/09 0701 - 12/10 0700 In: 3759.2 [P.O.:1315; I.V.:1558.2; Blood:580; IV Piggyback:306] Out: 1251 [Urine:1250; Stool:1] Lab Results  Component Value Date   CREATININE 3.74 (H) 12/17/2022   CREATININE 3.72 (H) 12/16/2022   CREATININE 3.72 (H) 12/15/2022     Objective:  Vital signs in last 24 hours:  Temp:  [97.2 F (36.2 C)-98 F (36.7 C)] 98 F (36.7 C) (12/10 0700) Pulse Rate:  [71-151] 77 (12/10 0700) Resp:  [12-28] 16 (12/10 0700) BP: (84-134)/(56-97) 113/65 (12/10 0700) SpO2:  [89 %-100 %] 92 % (12/10 0700)  Weight change:  Filed Weights   12/11/22 2135  Weight: 76.2 kg    Intake/Output: I/O last 3 completed shifts: In: 4815.9 [P.O.:1315; I.V.:2461.9; Blood:580; IV Piggyback:459] Out: 1651 [Urine:1650; Stool:1]   Intake/Output this shift:  Total I/O In: 68.7 [I.V.:68.7] Out: -   Physical Exam: General: NAD  Head: Normocephalic, atraumatic. Moist oral mucosal membranes  Eyes: Anicteric  Neck: Supple, trachea midline  Lungs:  Clear to auscultation  Heart: Regular rate and rhythm  Abdomen:  Soft, nontender,   Extremities: no peripheral edema.  Neurologic: Alert and oriented x 4  Skin: No lesions  Access: Left AVF + thrill and +bruit    Basic Metabolic Panel: Recent Labs  Lab 12/13/22 0537 12/13/22 1949 12/13/22 2212 12/14/22 0435 12/14/22 1007 12/14/22 2044 12/15/22 0341 12/16/22 0445 12/17/22 0507  NA 145   < > 147* 146*  --   --  141 140 139  K 3.9   < > 3.5 3.4*  --   --  4.0 3.8 3.9  CL 116*   < > 113* 112*  --   --  105 101 102  CO2 15*   < > 24 24  --   --  26 30 29   GLUCOSE 127*   < > 146* 127*  --   --  153* 118* 87  BUN 62*   < > 57* 57*  --   --  55* 56* 46*  CREATININE 3.75*   < > 3.67* 3.85*  --   --  3.72* 3.72* 3.74*   CALCIUM 6.7*   < > 6.6* 7.1*  --   --  7.2* 6.9* 6.8*  MG 1.8  --   --   --  1.6*  --  2.2 2.0 1.9  PHOS  --   --   --   --   --  3.9 4.2 3.7 3.6   < > = values in this interval not displayed.    Liver Function Tests: Recent Labs  Lab 12/13/22 0537 12/13/22 1949  AST 12* 15  ALT 11 8  ALKPHOS 45 33*  BILITOT 1.1 0.9  PROT 3.7* 3.0*  ALBUMIN 2.3* 1.8*   No results for input(s): "LIPASE", "AMYLASE" in the last 168 hours. No results for input(s): "AMMONIA" in the last 168 hours.  CBC: Recent Labs  Lab 12/11/22 2140 12/12/22 0418 12/13/22 1949 12/14/22 0149 12/14/22 0435 12/14/22 0554 12/15/22 0151 12/15/22 0341 12/15/22 1405 12/16/22 0445 12/17/22 0507  WBC 4.7   < > 7.6 12.8* 12.8*  --   --  8.0  --  6.0 5.6  NEUTROABS 2.8  --  6.0 10.2*  --   --   --   --   --   --   --  HGB 10.6*   < > 9.2* 7.3* 7.1*   < > 7.5* 7.3* 7.7* 7.4* 8.5*  HCT 34.7*   < > 27.4* 21.3* 20.7*   < > 22.2* 21.6* 22.3* 22.4* 25.2*  MCV 99.1   < > 89.3 87.3 85.9  --   --  87.4  --  89.6 90.0  PLT 154   < > 81* 102* 101*  --   --  89*  --  94* 101*   < > = values in this interval not displayed.    Cardiac Enzymes: No results for input(s): "CKTOTAL", "CKMB", "CKMBINDEX", "TROPONINI" in the last 168 hours.  BNP: Invalid input(s): "POCBNP"  CBG: Recent Labs  Lab 12/15/22 0637 12/15/22 0748 12/15/22 1145 12/15/22 1558 12/15/22 1928  GLUCAP 129* 140* 190* 151* 117*    Microbiology: Results for orders placed or performed during the hospital encounter of 12/12/22  MRSA Next Gen by PCR, Nasal     Status: None   Collection Time: 12/12/22  5:28 PM   Specimen: Nasal Mucosa; Nasal Swab  Result Value Ref Range Status   MRSA by PCR Next Gen NOT DETECTED NOT DETECTED Final    Comment: (NOTE) The GeneXpert MRSA Assay (FDA approved for NASAL specimens only), is one component of a comprehensive MRSA colonization surveillance program. It is not intended to diagnose MRSA infection nor to  guide or monitor treatment for MRSA infections. Test performance is not FDA approved in patients less than 61 years old. Performed at [facility], [address] [place], [place] [address]   Culture, blood (Routine X 2) w Reflex to ID Panel     Status: None (Preliminary result)   Collection Time: 12/13/22  7:42 PM   Specimen: BLOOD  Result Value Ref Range Status   Specimen Description BLOOD BLOOD LEFT ARM  Final   Special Requests   Final    BOTTLES DRAWN AEROBIC AND ANAEROBIC Blood Culture results may not be optimal due to an excessive volume of blood received in culture bottles   Culture   Final    NO GROWTH 4 DAYS Performed at [facility], [address], [place], [place] [address]    Report Status PENDING  Incomplete  Culture, blood (Routine X 2) w Reflex to ID Panel     Status: None (Preliminary result)   Collection Time: 12/13/22  9:35 PM   Specimen: BLOOD  Result Value Ref Range Status   Specimen Description BLOOD BLOOD RIGHT ARM  Final   Special Requests   Final    BOTTLES DRAWN AEROBIC AND ANAEROBIC Blood Culture adequate volume   Culture   Final    NO GROWTH 4 DAYS Performed at [facility], [address] [place], [place] [address]    Report Status PENDING  Incomplete    Coagulation Studies: No results for input(s): "LABPROT", "INR" in the last 72 hours.   Urinalysis: No results for input(s): "COLORURINE", "LABSPEC", "PHURINE", "GLUCOSEU", "HGBUR", "BILIRUBINUR", "KETONESUR", "PROTEINUR", "UROBILINOGEN", "NITRITE", "LEUKOCYTESUR" in the last 72 hours.  Invalid input(s): "APPERANCEUR"    Imaging: MR BRAIN WO CONTRAST  Result Date: 12/15/2022 CLINICAL DATA:  Temporary episode of unresponsiveness EXAM: MRI HEAD WITHOUT CONTRAST TECHNIQUE: Multiplanar, multiecho pulse sequences of the brain and surrounding structures were obtained without intravenous contrast. COMPARISON:  No prior MRI available, correlation is made with 12/15/2022  CT head FINDINGS: Brain: No restricted diffusion to suggest acute or subacute infarct. No acute hemorrhage, mass, mass effect, or midline shift. No hydrocephalus  or extra-axial collection. Partial empty sella. Normal craniocervical junction. Scattered foci of hemosiderin deposition in the bilateral cerebral hemispheres. Remote lacunar infarcts in the bilateral corona radiata. Scattered T2 hyperintense signal in the periventricular white matter, likely the sequela of mild chronic small vessel ischemic disease. Vascular: Normal arterial flow voids. Skull and upper cervical spine: Normal marrow signal. Sinuses/Orbits: Mucosal thickening in the left maxillary sinus. Status post bilateral lens replacements. No acute finding in the orbits. IMPRESSION: No acute intracranial process. No evidence of acute or subacute infarct. Electronically Signed   By: Wiliam Ke M.D.   On: 12/15/2022 20:55     Medications:    sodium chloride 75 mL/hr at 12/17/22 0755   levETIRAcetam Stopped (12/16/22 2350)    amoxicillin-clavulanate  1 tablet Oral BID   atorvastatin  20 mg Oral Daily   Chlorhexidine Gluconate Cloth  6 each Topical Daily   midodrine  10 mg Oral TID WC   pantoprazole  40 mg Oral BID   sodium chloride flush  10 mL Intravenous Q12H   sodium chloride flush  10-40 mL Intracatheter Q12H   tamsulosin  0.4 mg Oral QPC supper   acetaminophen **OR** acetaminophen, diltiazem, HYDROcodone-acetaminophen, ondansetron **OR** ondansetron (ZOFRAN) IV, mouth rinse, sodium chloride flush  Assessment/ Plan:  Mr. BODIE CROCCO Sr. is a 82 y.o.  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  Baseline creatinine 5.2 with GFR of 10 on 09/23/22.  -Creatinine appears to have stabilized at 3.7 with an EGFR of 15.  Producing urine.  No immediate need for dialysis.  He will need continued follow-up of his underlying chronic kidney disease as an outpatient.   Hypotension with volume loss:  -Patient remains off of vasopressors at this time.   Anemia with acute blood loss, iron deficiency and anemia of chronic kidney disease - status post PRBC transfusions -Hemoglobin improved to 8.5.  Continue to monitor.   LOS: 5 Terry Stephens 12/10/20249:18 AM

## 2022-12-18 DIAGNOSIS — K91840 Postprocedural hemorrhage and hematoma of a digestive system organ or structure following a digestive system procedure: Secondary | ICD-10-CM | POA: Diagnosis not present

## 2022-12-18 LAB — BASIC METABOLIC PANEL
Anion gap: 11 (ref 5–15)
BUN: 46 mg/dL — ABNORMAL HIGH (ref 8–23)
CO2: 25 mmol/L (ref 22–32)
Calcium: 7.1 mg/dL — ABNORMAL LOW (ref 8.9–10.3)
Chloride: 103 mmol/L (ref 98–111)
Creatinine, Ser: 4.12 mg/dL — ABNORMAL HIGH (ref 0.61–1.24)
GFR, Estimated: 14 mL/min — ABNORMAL LOW (ref >60)
Glucose, Bld: 86 mg/dL (ref 70–99)
Potassium: 3.8 mmol/L (ref 3.5–5.1)
Sodium: 139 mmol/L (ref 135–145)

## 2022-12-18 LAB — HEPARIN LEVEL (UNFRACTIONATED)
Heparin Unfractionated: 0.4 [IU]/mL (ref 0.30–0.70)
Heparin Unfractionated: 0.52 [IU]/mL (ref 0.30–0.70)

## 2022-12-18 LAB — CBC
HCT: 28.7 % — ABNORMAL LOW (ref 39.0–52.0)
Hemoglobin: 9.2 g/dL — ABNORMAL LOW (ref 13.0–17.0)
MCH: 29.6 pg (ref 26.0–34.0)
MCHC: 32.1 g/dL (ref 30.0–36.0)
MCV: 92.3 fL (ref 80.0–100.0)
Platelets: 105 10*3/uL — ABNORMAL LOW (ref 150–400)
RBC: 3.11 MIL/uL — ABNORMAL LOW (ref 4.22–5.81)
RDW: 16.8 % — ABNORMAL HIGH (ref 11.5–15.5)
WBC: 6.5 10*3/uL (ref 4.0–10.5)
nRBC: 0 % (ref 0.0–0.2)

## 2022-12-18 LAB — CULTURE, BLOOD (ROUTINE X 2)
Culture: NO GROWTH
Culture: NO GROWTH
Special Requests: ADEQUATE

## 2022-12-18 MED ORDER — APIXABAN 2.5 MG PO TABS
2.5000 mg | ORAL_TABLET | Freq: Two times a day (BID) | ORAL | Status: DC
  Administered 2022-12-18 – 2022-12-20 (×4): 2.5 mg via ORAL
  Filled 2022-12-18 (×4): qty 1

## 2022-12-18 MED ORDER — FUROSEMIDE 10 MG/ML IJ SOLN
4.0000 mg/h | INTRAVENOUS | Status: DC
  Administered 2022-12-18 – 2022-12-20 (×2): 4 mg/h via INTRAVENOUS
  Filled 2022-12-18 (×2): qty 20

## 2022-12-18 NOTE — Progress Notes (Signed)
Physical Therapy Treatment Patient Details Name: Terry Stephens. MRN: [id number] DOB: [birth year] Today's Date: 12/18/2022   History of Present Illness Pt is a 82 y.o. 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    PT Comments  Pt was long sitting in bed upon arrival with supportive son present. Pt's son left during session however pt is extremely pleasant and motivated. He was able to safely exit bed, stand to RW, and ambulate ~ 200 ft. HR is irregular and hits 160s BPM several times. RR into upper 30s however pt presents without distress. He did require several standing rest breaks during gait but overall no LOB or physical assistance. Pt will benefit from continued skilled PT at DC to maximize his independence and activity tolerance while improving safety with all ADLs. HHPT recommended at Dc.    If plan is discharge home, recommend the following: A little help with walking and/or transfers;A little help with bathing/dressing/bathroom;Assistance with cooking/housework;Assist for transportation;Help with stairs or ramp for entrance     Equipment Recommendations  Rolling walker (2 wheels)       Precautions / Restrictions Precautions Precautions: Fall Restrictions Weight Bearing Restrictions: No     Mobility  Bed Mobility Overal bed mobility: Needs Assistance Bed Mobility: Supine to Sit  Supine to sit: Contact guard, Used rails  General bed mobility comments: Increased time required to perform however was able to achieve EOB short sit without physical assistance    Transfers Overall transfer level: Needs assistance Equipment used:  Rolling walker (2 wheels) Transfers: Sit to/from Stand Sit to Stand: Contact guard assist  General transfer comment: CGA for safety to stand from EOB and recliner surface    Ambulation/Gait Ambulation/Gait assistance: Contact guard assist Gait Distance (Feet): 200 Feet Assistive device: Rolling walker (2 wheels) Gait Pattern/deviations: Step-through pattern Gait velocity: decreased  General Gait Details: Pt was able to ambulate ~ 200 ft  with RW with ~ 4 standing rest. HR irregular with 3 occasions of HR hitting 160sbpm and RR in mid 30s. Pt was assymptomatic. HR and RR lower with standing and seated rest    Balance Overall balance assessment: Needs assistance Sitting-balance support: Feet supported Sitting balance-Leahy Scale: Good     Standing balance support: During functional activity, Bilateral upper extremity supported Standing balance-Leahy Scale: Good Standing balance comment: no LOB during dynamic standing activity with use fo RW       Cognition Arousal: Alert Behavior During Therapy: WFL for tasks assessed/performed Overall Cognitive Status: Within Functional Limits for tasks assessed      General Comments: Pt is A and O. Needs vcs to focus on desired task, pt very talkative               Pertinent Vitals/Pain Pain Assessment Pain Assessment: No/denies pain Pain Intervention(s): Limited activity within patient's tolerance, Monitored during session, Premedicated before session, Repositioned     PT Goals (current goals can now be found in the care plan section) Acute Rehab PT Goals Patient Stated Goal: to get strong enough to return home Progress towards PT goals: Progressing toward goals    Frequency    Min 1X/week       AM-PAC PT "6 Clicks"  Mobility   Outcome Measure  Help needed turning from your back to your side while in a flat bed without using bedrails?: A Little Help needed moving from lying on your back to sitting on the side of a flat  bed without using bedrails?: A Little Help needed moving to and from a bed to a chair (including a wheelchair)?: A Little Help needed standing up from a chair using your arms (e.g., wheelchair or bedside chair)?: A Little Help needed to walk in hospital room?: A Little Help needed climbing 3-5 steps with a railing? : A Little 6 Click Score: 18    End of Session   Activity Tolerance: Patient tolerated treatment well;Patient limited by fatigue Patient left: in chair;with call bell/phone within reach;with chair alarm set;with nursing/sitter in room Nurse Communication: Mobility status PT Visit Diagnosis: Unsteadiness on feet (R26.81);Muscle weakness (generalized) (M62.81);Difficulty in walking, not elsewhere classified (R26.2)     Time: 1027-2536 PT Time Calculation (min) (ACUTE ONLY): 24 min  Charges:    $Gait Training: 8-22 mins $Therapeutic Activity: 8-22 mins PT General Charges $$ ACUTE PT VISIT: 1 Visit                     Jetta Lout PTA 12/18/22, 3:13 PM

## 2022-12-18 NOTE — Consult Note (Signed)
PHARMACY - ANTICOAGULATION CONSULT NOTE  Pharmacy Consult for IV Heparin Indication: atrial fibrillation  Patient Measurements: Height: 5\' 9"  (175.3 cm) Weight: 76.2 kg (168 lb) IBW/kg (Calculated) : 70.7 Heparin Dosing Weight: 76.2 kg  Labs: Recent Labs    12/16/22 0445 12/17/22 0507 12/17/22 1148 12/17/22 2142 12/18/22 0612  HGB 7.4* 8.5*  --   --  9.2*  HCT 22.4* 25.2*  --   --  28.7*  PLT 94* 101*  --   --  105*  APTT  --   --  36  --   --   LABPROT  --   --  15.3*  --   --   INR  --   --  1.2  --   --   HEPARINUNFRC  --   --  <0.10* 0.15* 0.40  CREATININE 3.72* 3.74*  --   --  4.12*   Estimated Creatinine Clearance: 13.6 mL/min (A) (by C-G formula based on SCr of 4.12 mg/dL (H)).  Medical History: Past Medical History:  Diagnosis Date   A-fib (HCC)    Allergic rhinitis    CAD (coronary artery disease)    Chronic kidney disease    Hypertension    Medications:  Apixaban 2.5 mg BID PTA, last pt reported dose was 12/08/22  Assessment: 82 y/o M with history as above and including Afib on apixaban who is admitted with hemorrhagic shock in setting of lower GIB s/p polypectomy. No further bleeding at this point and H&H are stable. Plan is to re-start heparin, no bolus protocol.  Baseline aPTT 36, INR 1.2 and heparin level <0.1.  1210 2142 HL 0.15 1211 0612 HL 0.4  Goal of Therapy:  Heparin level 0.3 - 0.5 units/ml Monitor platelets by anticoagulation protocol: Yes   Plan:  --Heparin level is therapeutic x 1 --Continue heparin infusion at 1150 units/hr --Confirmatory heparin level in 8 hours --CBC daily per protocol, monitor closely given recent bleeding --Follow-up transition back to home apixaban when appropriate  Tressie Ellis 12/18/2022 7:49 AM

## 2022-12-18 NOTE — Progress Notes (Signed)
[facility]  ROUNDING NOTE   Subjective:   Patient was given Lasix yesterday and renal function a bit worse today. Creatinine up to 4.12. Urine output 1.3 L over the preceding 24 hours. 12/10 0701 - 12/11 0700 In: 777.8 [P.O.:240; I.V.:537.8] Out: 1350 [Urine:1350] Lab Results  Component Value Date   CREATININE 4.12 (H) 12/18/2022   CREATININE 3.74 (H) 12/17/2022   CREATININE 3.72 (H) 12/16/2022     Objective:  Vital signs in last 24 hours:  Temp:  [98 F (36.7 C)-98.4 F (36.9 C)] 98 F (36.7 C) (12/11 0400) Pulse Rate:  [71-92] 73 (12/11 0400) Resp:  [15-25] 20 (12/11 0600) BP: (92-132)/(53-85) 107/57 (12/11 0400) SpO2:  [94 %-100 %] 95 % (12/11 0600)  Weight change:  Filed Weights   12/11/22 2135  Weight: 76.2 kg    Intake/Output: I/O last 3 completed shifts: In: 2852 [P.O.:480; I.V.:2066; IV Piggyback:306] Out: 1825 [Urine:1825]   Intake/Output this shift:  No intake/output data recorded.  Physical Exam: General: NAD  Head: Normocephalic, atraumatic. Moist oral mucosal membranes  Eyes: Anicteric  Neck: Supple, trachea midline  Lungs:  Clear to auscultation  Heart: Regular rate and rhythm  Abdomen:  Soft, nontender,   Extremities: 1+ bilateral lower extremity edema  Neurologic: Alert and oriented x 4  Skin: No lesions  Access: Left AVF + thrill and +bruit    Basic Metabolic Panel: Recent Labs  Lab 12/13/22 0537 12/13/22 1949 12/14/22 0435 12/14/22 1007 12/14/22 2044 12/15/22 0341 12/16/22 0445 12/17/22 0507 12/18/22 0612  NA 145   < > 146*  --   --  141 140 139 139  K 3.9   < > 3.4*  --   --  4.0 3.8 3.9 3.8  CL 116*   < > 112*  --   --  105 101 102 103  CO2 15*   < > 24  --   --  26 30 29 25   GLUCOSE 127*   < > 127*  --   --  153* 118* 87 86  BUN 62*   < > 57*  --   --  55* 56* 46* 46*  CREATININE 3.75*   < > 3.85*  --   --  3.72* 3.72* 3.74* 4.12*  CALCIUM 6.7*   < > 7.1*  --   --  7.2* 6.9* 6.8* 7.1*  MG 1.8  --   --   1.6*  --  2.2 2.0 1.9  --   PHOS  --   --   --   --  3.9 4.2 3.7 3.6  --    < > = values in this interval not displayed.    Liver Function Tests: Recent Labs  Lab 12/13/22 0537 12/13/22 1949  AST 12* 15  ALT 11 8  ALKPHOS 45 33*  BILITOT 1.1 0.9  PROT 3.7* 3.0*  ALBUMIN 2.3* 1.8*   No results for input(s): "LIPASE", "AMYLASE" in the last 168 hours. No results for input(s): "AMMONIA" in the last 168 hours.  CBC: Recent Labs  Lab 12/11/22 2140 12/12/22 0418 12/13/22 1949 12/14/22 0149 12/14/22 0435 12/14/22 0554 12/15/22 0341 12/15/22 1405 12/16/22 0445 12/17/22 0507 12/18/22 0612  WBC 4.7   < > 7.6 12.8* 12.8*  --  8.0  --  6.0 5.6 6.5  NEUTROABS 2.8  --  6.0 10.2*  --   --   --   --   --   --   --   HGB 10.6*   < >  9.2* 7.3* 7.1*   < > 7.3* 7.7* 7.4* 8.5* 9.2*  HCT 34.7*   < > 27.4* 21.3* 20.7*   < > 21.6* 22.3* 22.4* 25.2* 28.7*  MCV 99.1   < > 89.3 87.3 85.9  --  87.4  --  89.6 90.0 92.3  PLT 154   < > 81* 102* 101*  --  89*  --  94* 101* 105*   < > = values in this interval not displayed.    Cardiac Enzymes: No results for input(s): "CKTOTAL", "CKMB", "CKMBINDEX", "TROPONINI" in the last 168 hours.  BNP: Invalid input(s): "POCBNP"  CBG: Recent Labs  Lab 12/15/22 0637 12/15/22 0748 12/15/22 1145 12/15/22 1558 12/15/22 1928  GLUCAP 129* 140* 190* 151* 117*    Microbiology: Results for orders placed or performed during the hospital encounter of 12/12/22  MRSA Next Gen by PCR, Nasal     Status: None   Collection Time: 12/12/22  5:28 PM   Specimen: Nasal Mucosa; Nasal Swab  Result Value Ref Range Status   MRSA by PCR Next Gen NOT DETECTED NOT DETECTED Final    Comment: (NOTE) The GeneXpert MRSA Assay (FDA approved for NASAL specimens only), is one component of a comprehensive MRSA colonization surveillance program. It is not intended to diagnose MRSA infection nor to guide or monitor treatment for MRSA infections. Test performance is not FDA  approved in patients less than 79 years old. Performed at [facility], [address] [place], [place] [address]   Culture, blood (Routine X 2) w Reflex to ID Panel     Status: None   Collection Time: 12/13/22  7:42 PM   Specimen: BLOOD  Result Value Ref Range Status   Specimen Description BLOOD BLOOD LEFT ARM  Final   Special Requests   Final    BOTTLES DRAWN AEROBIC AND ANAEROBIC Blood Culture results may not be optimal due to an excessive volume of blood received in culture bottles   Culture   Final    NO GROWTH 5 DAYS Performed at [facility], [address], [place], [place] [address]    Report Status 12/18/2022 FINAL  Final  Culture, blood (Routine X 2) w Reflex to ID Panel     Status: None   Collection Time: 12/13/22  9:35 PM   Specimen: BLOOD  Result Value Ref Range Status   Specimen Description BLOOD BLOOD RIGHT ARM  Final   Special Requests   Final    BOTTLES DRAWN AEROBIC AND ANAEROBIC Blood Culture adequate volume   Culture   Final    NO GROWTH 5 DAYS Performed at [facility], [address] [place], [place] [address]    Report Status 12/18/2022 FINAL  Final    Coagulation Studies: Recent Labs    12/17/22 1148  LABPROT 15.3*  INR 1.2     Urinalysis: No results for input(s): "COLORURINE", "LABSPEC", "PHURINE", "GLUCOSEU", "HGBUR", "BILIRUBINUR", "KETONESUR", "PROTEINUR", "UROBILINOGEN", "NITRITE", "LEUKOCYTESUR" in the last 72 hours.  Invalid input(s): "APPERANCEUR"    Imaging: EEG adult  Result Date: 12/17/2022 Charlsie Quest, MD     12/17/2022 10:21 AM Patient Name: Terry Ano Matsumura Sr. MRN: [id number] Epilepsy Attending: Charlsie Quest Referring Physician/Provider: Milon Dikes, MD Date: 12/16/2022 Duration: 31.04 mins  Patient history: 82yo M with left gaze deviation and right-sided weakness which resolved quickly. EEG to evaluate for seizure  Level of alertness: Awake, asleep  AEDs during EEG study: None   Technical aspects: This EEG study was  done with scalp electrodes positioned according to the 10-20 International system of electrode placement. Electrical activity was reviewed with band pass filter of 1-70Hz , sensitivity of 7 uV/mm, display speed of 34mm/sec with a 60Hz  notched filter applied as appropriate. EEG data were recorded continuously and digitally stored.  Video monitoring was available and reviewed as appropriate.  Description: The posterior dominant rhythm consists of 8 Hz activity of moderate voltage (25-35 uV) seen predominantly in posterior head regions, symmetric and reactive to eye opening and eye closing. Sleep was characterized by vertex waves, sleep spindles (12 to 14 Hz), maximal frontocentral region. Hyperventilation and photic stimulation were not performed.    IMPRESSION: This study is within normal limits. No seizures or epileptiform discharges were seen throughout the recording.  A normal interictal EEG does not exclude the diagnosis of epilepsy.   Priyanka Annabelle Harman      Medications:    heparin 1,150 Units/hr (12/18/22 0600)    amoxicillin-clavulanate  1 tablet Oral BID   atorvastatin  20 mg Oral Daily   Chlorhexidine Gluconate Cloth  6 each Topical Daily   midodrine  10 mg Oral TID WC   pantoprazole  40 mg Oral BID   sodium chloride flush  10 mL Intravenous Q12H   sodium chloride flush  10-40 mL Intracatheter Q12H   tamsulosin  0.4 mg Oral QPC supper   acetaminophen **OR** acetaminophen, diltiazem, HYDROcodone-acetaminophen, ondansetron **OR** ondansetron (ZOFRAN) IV, mouth rinse, sodium chloride flush  Assessment/ Plan:  Mr. GIOMAR ROHS Sr. is a 82 y.o.  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 Baseline creatinine 5.2 with GFR of 10 on 09/23/22.  -Urine output was 1.3 L under the influence of Lasix yesterday.  Creatinine a bit worse today.  Hold off on administering further Lasix at the moment.   Hypotension with volume loss:  -Doing well off pressors.  Blood pressure currently 107/57.   Anemia with acute blood loss, iron deficiency and anemia of chronic kidney disease - status post PRBC transfusions -Hemoglobin up to 9.2 this a.m.   LOS: 6 Terry Stephens 12/11/20248:00 AM

## 2022-12-18 NOTE — Plan of Care (Signed)
  Problem: Education: Goal: Knowledge of General Education information will improve Description: Including pain rating scale, medication(s)/side effects and non-pharmacologic comfort measures Outcome: Progressing   Problem: Health Behavior/Discharge Planning: Goal: Ability to manage health-related needs will improve Outcome: Progressing   Problem: Clinical Measurements: Goal: Ability to maintain clinical measurements within normal limits will improve Outcome: Progressing Goal: Will remain free from infection Outcome: Progressing Goal: Diagnostic test results will improve Outcome: Progressing Goal: Respiratory complications will improve Outcome: Progressing Goal: Cardiovascular complication will be avoided Outcome: Progressing   Problem: Coping: Goal: Level of anxiety will decrease Outcome: Progressing   Problem: Nutrition: Goal: Adequate nutrition will be maintained Outcome: Progressing   Problem: Elimination: Goal: Will not experience complications related to bowel motility Outcome: Progressing Goal: Will not experience complications related to urinary retention Outcome: Progressing   Problem: Safety: Goal: Ability to remain free from injury will improve Outcome: Progressing   Problem: Skin Integrity: Goal: Risk for impaired skin integrity will decrease Outcome: Progressing   Problem: Pain Management: Goal: General experience of comfort will improve Outcome: Progressing

## 2022-12-18 NOTE — Progress Notes (Signed)
[facility] Progress Note  Patient: Terry Stephens    NGE:[id number]  DOA: 12/12/2022     Date of Service: the patient was seen and examined on 12/18/2022  Chief Complaint  Patient presents with   Rectal Bleeding   Brief hospital course: Terry Fedak Pinckney Sr. is a 82 y.o. male with PMH of CKD5, HTN, A-fib on Eliquis (last dose 12/2), CAD s/p CABG, ascending aortic dissection s/p repair 1974, bioprosthetic aortic valve, SVT on high-dose metoprolol succinate, history of syncope with prior loop recorder who presented to [facility] ED on 12/11/22 due to complaints of rectal bleeding that started several hours following Colonoscopy and polypectomy.  He underwent elective EGD and Colonoscopy on 12/11/22 for workup of iron deficiency anemia, which required snare polypectomy of 15 polyps with placement of multiple hemostatic clips. He reported that several hours after the procedure he felt like he was oozing blood and began passing dark clots. He endorsed mild lightheadedness but denies chest pain, shortness of breath or palpitations, but denied abdominal pain. Of note, patient stopped taking Eliquis since 12/2 preparation for his colonoscopy and has not yet resumed. He was however taking a baby aspirin as advised by his cardiologist who did preprocedural cardiac clearance.   Significant Hospital Events:  12/4: Underwent elective EGD and Colostomy for evaluation of IDA, with polypectomy.  Went home post procedure, however in the following hours at home, passing bright red blood and clots from rectum.  Went to ED for evaluation. 12/5: [facility] admitted. GI consulted. Received enema for repeat colonoscopy with loss of large amount of bright red blood per rectum with associated hypotension.  Was given fluids and 2 units blood, required low dose peripheral Levophed 12/6: Repeat Colonoscopy performed.  Late in the evening, again Hypotensive, given 1 unit of blood and placed on Levophed. PCCM consulted,  Central  line placed. 12/7 severe hypovolumic shock 12/8 code stroke was called due to left gaze and right-sided weakness, not a tPA candidate, MRI brain negative 12/9 patient was stabilized and transferred to [facility] service  Further management as below  Assessment and Plan:  # Hypovolemic shock due to acute blood loss anemia Patient was transferred to ICU for pressor support Levophed gradually weaned off Continue midodrine 10 mg p.o. 3 times daily with holding parameters Monitor BP and titrate medications accordingly  # Lower GI bleeding s/p polypectomy S/p repeat colonoscopy on 12/6 with active bleeding in cecum requiring three hemostatic clips for endoscopic hemostasis.  Continue pantoprazole 40 mg twice daily S/p full liquid diet, advance to soft diet Monitor H&H  # Acute blood loss anemia S/p multiple PRBC transfusions 12/9 Hb 7.4, transfuse 1 unit of PRBC to keep Hb >8 12/11 Hb 9.2, no GI bleeding Monitor H&H and transfuse 12/11 resume Eliquis today, pharmacy was consulted.     # Transient left gaze and right-sided weakness, resolved, unknown cause CT head code stroke no ICH, normal for age, intracranial artery toxicity and calcified atherosclerosis. CTA head and neck: Negative CT perfusion and negative for large vessel occlusion. MRI brain: No acute intracranial process. No evidence of acute or subacute infarct. S/p Keppra 250 IV every 12 hourly. EEG negative. Neurology consulted, recommended no need of AED, DC'd Keppra.  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 twice daily, pharmacy consulted. 12/11 started Lasix IV infusion, due to significant edema and soft blood pressure.  # Left upper lobe peribronchial opacity Incidental finding on CTA head and neck Recommend to follow-up with PCP and pulmonary as an outpatient   Body mass index is 24.81 kg/m.  Interventions:  Diet: Soft diet DVT Prophylaxis: Therapeutic Anticoagulation with heparin IV infusion    Advance goals of care discussion: Full code  Family Communication: family was present at bedside, at the time of interview.  The pt provided permission to discuss medical plan with the family. Opportunity was given to ask question and all questions were answered satisfactorily.   Disposition:  Pt is from Home, admitted with GI bleeding, still has risk of bleeding, started heparin IV infusion and plan is to transition to Eliquis tomorrow a.m. if no bleeding.  Significant edema, started Lasix Discharge to Home, when stable, most likely in 2 to 3 days  Subjective: No significant events overnight, patient was sitting comfortably in the recliner, right arm edema is improving, still has some scrotal edema. Denied any GI bleeding, patient moved bowels last night, no bleeding. Patient agreed to start Eliquis.  Physical Exam: General: NAD, lying comfortably Appear in no distress, affect appropriate Eyes: PERRLA ENT: Oral Mucosa Clear, moist  Neck: no JVD,  Cardiovascular: S1 and S2 Present, no Murmur,  Respiratory: good respiratory effort, Bilateral Air entry equal and Decreased, no Crackles, no wheezes Abdomen: Bowel Sound present,  Soft and no tenderness, edematous genitalia Skin: no rashes Extremities: no Pedal edema, no calf tenderness.  Right forearm edema, keep arm elevated Neurologic: without any new focal findings Gait not checked due to patient safety concerns  Vitals:   12/18/22 0400 12/18/22 0600 12/18/22 0851 12/18/22 1200  BP: (!) 107/57  115/63   Pulse: 73  81 96  Resp: 16 20 (!) 24 17  Temp: 98 F (36.7 C)  98 F (36.7 C) 97.9 F (36.6 C)  TempSrc: Oral  Oral Oral  SpO2: 98% 95% 96% 95%  Weight:      Height:        Intake/Output Summary (Last 24 hours) at 12/18/2022 1556 Last data filed at 12/18/2022 1057 Gross per 24 hour  Intake 654.31 ml  Output 900 ml  Net -245.69 ml   Filed Weights   12/11/22 2135  Weight: 76.2 kg    Data Reviewed: I have personally reviewed and interpreted daily labs, tele strips, imagings as discussed above. I reviewed all nursing notes, pharmacy notes, vitals, pertinent old records I have discussed plan of care as described above with RN and patient/family.  CBC: Recent Labs  Lab 12/11/22 2140 12/12/22 0418 12/13/22 1949 12/14/22 0149 12/14/22 0435 12/14/22 0554 12/15/22 0341 12/15/22 1405 12/16/22 0445 12/17/22 0507 12/18/22 0612  WBC 4.7   < > 7.6 12.8* 12.8*  --  8.0  --  6.0 5.6 6.5  NEUTROABS 2.8  --  6.0 10.2*  --   --   --   --   --   --   --  HGB 10.6*   < > 9.2* 7.3* 7.1*   < > 7.3* 7.7* 7.4* 8.5* 9.2*  HCT 34.7*   < > 27.4* 21.3* 20.7*   < > 21.6* 22.3* 22.4* 25.2* 28.7*  MCV 99.1   < > 89.3 87.3 85.9  --  87.4  --  89.6 90.0 92.3  PLT 154   < > 81* 102* 101*  --  89*  --  94* 101* 105*   < > = values in this interval not displayed.   Basic Metabolic Panel: Recent Labs  Lab 12/13/22 0537 12/13/22 1949 12/14/22 0435 12/14/22 1007 12/14/22 2044 12/15/22 0341 12/16/22 0445 12/17/22 0507 12/18/22 0612  NA 145   < > 146*  --   --  141 140 139 139  K 3.9   < > 3.4*  --   --  4.0 3.8 3.9 3.8  CL 116*   < > 112*  --   --  105 101  102 103  CO2 15*   < > 24  --   --  26 30 29 25   GLUCOSE 127*   < > 127*  --   --  153* 118* 87 86  BUN 62*   < > 57*  --   --  55* 56* 46* 46*  CREATININE 3.75*   < > 3.85*  --   --  3.72* 3.72* 3.74* 4.12*  CALCIUM 6.7*   < > 7.1*  --   --  7.2* 6.9* 6.8* 7.1*  MG 1.8  --   --  1.6*  --  2.2 2.0 1.9  --   PHOS  --   --   --   --  3.9 4.2 3.7 3.6  --    < > = values in this interval not displayed.    Studies: No results found.  Scheduled Meds:  amoxicillin-clavulanate  1 tablet Oral BID   apixaban  2.5 mg Oral BID   atorvastatin  20 mg Oral Daily   Chlorhexidine Gluconate Cloth  6 each Topical Daily   midodrine  10 mg Oral TID WC   pantoprazole  40 mg Oral BID   sodium chloride flush  10 mL Intravenous Q12H   sodium chloride flush  10-40 mL Intracatheter Q12H   tamsulosin  0.4 mg Oral QPC supper   Continuous Infusions:  furosemide (LASIX) 200 mg in dextrose 5 % 100 mL (2 mg/mL) infusion     heparin 1,100 Units/hr (12/18/22 1454)   PRN Meds: acetaminophen **OR** acetaminophen, diltiazem, HYDROcodone-acetaminophen, ondansetron **OR** ondansetron (ZOFRAN) IV, mouth rinse, sodium chloride flush  Time spent: 55 minutes  Author: Gillis Santa. MD [facility] 12/18/2022 3:56 PM  To reach On-call, see care teams to locate the attending and reach out to them via [email or web address]. If 7PM-7AM, please contact night-coverage If you still have difficulty reaching the attending provider, please page the [facility] (Director on Call) for [facility] on amion for assistance.

## 2022-12-18 NOTE — Consult Note (Signed)
PHARMACY - ANTICOAGULATION CONSULT NOTE  Pharmacy Consult for IV Heparin Indication: atrial fibrillation  Patient Measurements: Height: 5\' 9"  (175.3 cm) Weight: 76.2 kg (168 lb) IBW/kg (Calculated) : 70.7 Heparin Dosing Weight: 76.2 kg  Labs: Recent Labs    12/16/22 0445 12/17/22 0507 12/17/22 1148 12/17/22 1148 12/17/22 2142 12/18/22 0612 12/18/22 1332  HGB 7.4* 8.5*  --   --   --  9.2*  --   HCT 22.4* 25.2*  --   --   --  28.7*  --   PLT 94* 101*  --   --   --  105*  --   APTT  --   --  36  --   --   --   --   LABPROT  --   --  15.3*  --   --   --   --   INR  --   --  1.2  --   --   --   --   HEPARINUNFRC  --   --  <0.10*   < > 0.15* 0.40 0.52  CREATININE 3.72* 3.74*  --   --   --  4.12*  --    < > = values in this interval not displayed.   Estimated Creatinine Clearance: 13.6 mL/min (A) (by C-G formula based on SCr of 4.12 mg/dL (H)).  Medical History: Past Medical History:  Diagnosis Date   A-fib (HCC)    Allergic rhinitis    CAD (coronary artery disease)    Chronic kidney disease    Hypertension    Medications:  Apixaban 2.5 mg BID PTA, last pt reported dose was 12/08/22  Assessment: 82 y/o M with history as above and including Afib on apixaban who is admitted with hemorrhagic shock in setting of lower GIB s/p polypectomy. No further bleeding at this point and H&H are stable. Plan is to re-start heparin, no bolus protocol.  Baseline aPTT 36, INR 1.2 and heparin level <0.1.  1210 2142 HL 0.15 1211 0612 HL 0.4 1211 1332 HL 0.52  Goal of Therapy:  Heparin level 0.3 - 0.5 units/ml Monitor platelets by anticoagulation protocol: Yes   Plan:  --Heparin level is slightly supratherapeutic --Decrease heparin infusion to 1100 units/hr --Re-check heparin level in 8 hours from rate change --CBC daily per protocol, monitor closely given recent bleeding --Follow-up transition back to home apixaban when appropriate  Tressie Ellis 12/18/2022 2:20 PM

## 2022-12-18 NOTE — Plan of Care (Signed)

## 2022-12-19 ENCOUNTER — Other Ambulatory Visit: Payer: Self-pay

## 2022-12-19 DIAGNOSIS — K91840 Postprocedural hemorrhage and hematoma of a digestive system organ or structure following a digestive system procedure: Secondary | ICD-10-CM | POA: Diagnosis not present

## 2022-12-19 LAB — BASIC METABOLIC PANEL
Anion gap: 9 (ref 5–15)
BUN: 46 mg/dL — ABNORMAL HIGH (ref 8–23)
CO2: 25 mmol/L (ref 22–32)
Calcium: 7 mg/dL — ABNORMAL LOW (ref 8.9–10.3)
Chloride: 104 mmol/L (ref 98–111)
Creatinine, Ser: 4.08 mg/dL — ABNORMAL HIGH (ref 0.61–1.24)
GFR, Estimated: 14 mL/min — ABNORMAL LOW (ref >60)
Glucose, Bld: 88 mg/dL (ref 70–99)
Potassium: 3.8 mmol/L (ref 3.5–5.1)
Sodium: 138 mmol/L (ref 135–145)

## 2022-12-19 LAB — CBC
HCT: 27 % — ABNORMAL LOW (ref 39.0–52.0)
Hemoglobin: 8.7 g/dL — ABNORMAL LOW (ref 13.0–17.0)
MCH: 29.6 pg (ref 26.0–34.0)
MCHC: 32.2 g/dL (ref 30.0–36.0)
MCV: 91.8 fL (ref 80.0–100.0)
Platelets: 107 10*3/uL — ABNORMAL LOW (ref 150–400)
RBC: 2.94 MIL/uL — ABNORMAL LOW (ref 4.22–5.81)
RDW: 16.3 % — ABNORMAL HIGH (ref 11.5–15.5)
WBC: 5.8 10*3/uL (ref 4.0–10.5)
nRBC: 0 % (ref 0.0–0.2)

## 2022-12-19 LAB — PHOSPHORUS: Phosphorus: 4.3 mg/dL (ref 2.5–4.6)

## 2022-12-19 LAB — MAGNESIUM: Magnesium: 1.9 mg/dL (ref 1.7–2.4)

## 2022-12-19 MED ORDER — ALPRAZOLAM 0.5 MG PO TABS
0.5000 mg | ORAL_TABLET | Freq: Three times a day (TID) | ORAL | Status: DC | PRN
  Administered 2022-12-19: 0.5 mg via ORAL
  Filled 2022-12-19: qty 1

## 2022-12-19 NOTE — Progress Notes (Signed)
OT Cancellation Note  Patient Details Name: Terry GUEVARRA Sr. MRN: [id number] DOB: Feb 25, 1940   Cancelled Treatment:    Reason Eval/Treat Not Completed: Patient declined, no reason specified. Pt refused all attempts at session this date including bed level therex and bathing. Per RN pt reporting fatigue and malaise all day. Will re-attempt as pt willing and appropriate.   Kathie Dike, M.S. OTR/L  12/19/22, 3:51 PM  ascom [phone]

## 2022-12-19 NOTE — Progress Notes (Addendum)
[facility] Progress Note  Patient: Terry Stephens    WUJ:[id number]  DOA: 12/12/2022     Date of Service: the patient was seen and examined on 12/19/2022  Chief Complaint  Patient presents with   Rectal Bleeding   Brief hospital course: Terry Sha Durrett Sr. is a 82 y.o. male with PMH of CKD5, HTN, A-fib on Eliquis (last dose 12/2), CAD s/p CABG, ascending aortic dissection s/p repair 1974, bioprosthetic aortic valve, SVT on high-dose metoprolol succinate, history of syncope with prior loop recorder who presented to [facility] ED on 12/11/22 due to complaints of rectal bleeding that started several hours following Colonoscopy and polypectomy.  He underwent elective EGD and Colonoscopy on 12/11/22 for workup of iron deficiency anemia, which required snare polypectomy of 15 polyps with placement of multiple hemostatic clips. He reported that several hours after the procedure he felt like he was oozing blood and began passing dark clots. He endorsed mild lightheadedness but denies chest pain, shortness of breath or palpitations, but denied abdominal pain. Of note, patient stopped taking Eliquis since 12/2 preparation for his colonoscopy and has not yet resumed. He was however taking a baby aspirin as advised by his cardiologist who did preprocedural cardiac clearance.   Significant Hospital Events:  12/4: Underwent elective EGD and Colostomy for evaluation of IDA, with polypectomy.  Went home post procedure, however in the following hours at home, passing bright red blood and clots from rectum.  Went to ED for evaluation. 12/5: [facility] admitted. GI consulted. Received enema for repeat colonoscopy with loss of large amount of bright red blood per rectum with associated hypotension.  Was given fluids and 2 units blood, required low dose peripheral Levophed 12/6: Repeat Colonoscopy performed.  Late in the evening, again Hypotensive, given 1 unit of blood and placed on Levophed. PCCM consulted,  Central  line placed. 12/7 severe hypovolumic shock 12/8 code stroke was called due to left gaze and right-sided weakness, not a tPA candidate, MRI brain negative 12/9 patient was stabilized and transferred to [facility] service  Further management as below  Assessment and Plan:  # Hypovolemic shock due to acute blood loss anemia Patient was transferred to ICU for pressor support Levophed gradually weaned off Continue midodrine 10 mg p.o. 3 times daily with holding parameters Monitor BP and titrate medications accordingly  # Lower GI bleeding s/p polypectomy S/p repeat colonoscopy on 12/6 with active bleeding in cecum requiring three hemostatic clips for endoscopic hemostasis.  Continue pantoprazole 40 mg twice daily S/p full liquid diet, advance to soft diet Monitor H&H  # Acute blood loss anemia S/p multiple PRBC transfusions 12/9 Hb 7.4, transfuse 1 unit of PRBC to keep Hb >8 12/12 Hb 8.7, no GI bleeding Monitor H&H and transfuse 12/11 resume Eliquis    # Transient left gaze and right-sided weakness, resolved, unknown cause CT head code stroke no ICH, normal for age, intracranial artery toxicity and calcified atherosclerosis. CTA head and neck: Negative CT perfusion and negative for large vessel occlusion. MRI brain: No acute intracranial process. No evidence of acute or subacute infarct. S/p Keppra 250 IV every 12 hourly. EEG negative. Neurology consulted, recommended no need of AED, DC'd Keppra.  # Metabolic acidosis due to CKD and lactic acidosis.  Resolved CKD stage V (new baseline Cr 3.72 eGFR 15 on 12/14/22) Avoid nephrotoxic medication, monitor intake output Renally dose medications Nephrology following S/p bicarb IV infusion and IVF, DC'd IV fluid due to edema Creatinine 4.08 stable   #  Chronic systolic CHF, EF 40 to 45%, CAD s/p CABG # A-fib, sp bioprosthetic aortic valve replacement Held metoprolol and losartan due to hypotension Held Eliquis due to GI bleed S/p  vasopressors, continue midodrine to support BP Monitor BP and titrate medications accordingly 12/10 noted significant edema of right arm and general edema Discussed with nephrology, discontinued IV fluid, started Lasix Monitor volume status and renal functions 12/10 s/p heparin IV infusion without bolus, H&H remained stable, no bleeding, discontinued heparin drip on 12/11 12/11 started Eliquis 2.5 mg p.o. twice daily 12/11 started Lasix IV infusion, due to significant edema and soft blood pressure.   # Left upper lobe peribronchial opacity Incidental finding on CTA head and neck Recommend to follow-up with PCP and pulmonary as an outpatient   Body mass index is 24.81 kg/m.  Interventions:  Diet: Soft diet DVT Prophylaxis: Therapeutic Anticoagulation with heparin IV infusion    Advance goals of care discussion: Full code  Family Communication: family was present at bedside, at the time of interview.  The pt provided permission to discuss medical plan with the family. Opportunity was given to ask question and all questions were answered satisfactorily.   Disposition:  Pt is from Home, admitted with GI bleeding, still has risk of bleeding, s/p heparin IV gtt, transitioned to Eliquis on 12/11.  Significant edema, started Lasix.  12/12 edema improving, continued IV Lasix infusion Discharge to Home, when stable, most likely in 1 to 2 days  Subjective: No significant events overnight, patient was sleeping comfortably, woke up by calling his name.  Patient was feeling that he is going to die, vital signs were stable, he was just scared for no reason. Patient was on telemetry, vital signs remained stable. We will continue above treatment and monitor.  Physical Exam: General: NAD, lying comfortably Appear in no distress, affect appropriate Eyes: PERRLA ENT: Oral Mucosa Clear, moist  Neck: no JVD,  Cardiovascular: S1 and S2 Present, no Murmur,  Respiratory: good respiratory effort,  Bilateral Air entry equal and Decreased, no Crackles, no wheezes Abdomen: Bowel Sound present, Soft and no tenderness, edematous genitalia Skin: no rashes Extremities: 3-4+ Pedal edema, no calf tenderness.  Right forearm edema, keep arm elevated Neurologic: without any new focal findings Gait not checked due to patient safety concerns  Vitals:   12/19/22 0000 12/19/22 0400 12/19/22 0800 12/19/22 1200  BP: 126/64 132/66 (!) 145/69 (!) 147/76  Pulse: 78 72 80 92  Resp: (!) 21 (!) 21 (!) 23 (!) 28  Temp: 97.6 F (36.4 C)  97.9 F (36.6 C)   TempSrc: Oral  Oral   SpO2: 96% 96% 96% 98%  Weight:      Height:        Intake/Output Summary (Last 24 hours) at 12/19/2022 1643 Last data filed at 12/19/2022 1030 Gross per 24 hour  Intake 577.2 ml  Output 3100 ml  Net -2522.8 ml   Filed Weights   12/11/22 2135  Weight: 76.2 kg    Data Reviewed: I have personally reviewed and interpreted daily labs, tele strips, imagings as discussed above. I reviewed all nursing notes, pharmacy notes, vitals, pertinent old records I have discussed plan of care as described above with RN and patient/family.  CBC: Recent Labs  Lab 12/13/22 1949 12/14/22 0149 12/14/22 0435 12/15/22 0341 12/15/22 1405 12/16/22 0445 12/17/22 0507 12/18/22 0612 12/19/22 0501  WBC 7.6 12.8*   < > 8.0  --  6.0 5.6 6.5 5.8  NEUTROABS 6.0 10.2*  --   --   --   --   --   --   --  HGB 9.2* 7.3*   < > 7.3* 7.7* 7.4* 8.5* 9.2* 8.7*  HCT 27.4* 21.3*   < > 21.6* 22.3* 22.4* 25.2* 28.7* 27.0*  MCV 89.3 87.3   < > 87.4  --  89.6 90.0 92.3 91.8  PLT 81* 102*   < > 89*  --  94* 101* 105* 107*   < > = values in this interval not displayed.   Basic Metabolic Panel: Recent Labs  Lab 12/14/22 1007 12/14/22 2044 12/15/22 0341 12/16/22 0445 12/17/22 0507 12/18/22 0612 12/19/22 0501  NA  --   --  141 140 139 139 138  K  --   --  4.0 3.8 3.9 3.8 3.8  CL  --   --  105 101 102 103 104  CO2  --   --  26 30 29 25 25    GLUCOSE  --   --  153* 118* 87 86 88  BUN  --   --  55* 56* 46* 46* 46*  CREATININE  --   --  3.72* 3.72* 3.74* 4.12* 4.08*  CALCIUM  --   --  7.2* 6.9* 6.8* 7.1* 7.0*  MG 1.6*  --  2.2 2.0 1.9  --  1.9  PHOS  --  3.9 4.2 3.7 3.6  --  4.3    Studies: No results found.  Scheduled Meds:  apixaban  2.5 mg Oral BID   atorvastatin  20 mg Oral Daily   Chlorhexidine Gluconate Cloth  6 each Topical Daily   midodrine  10 mg Oral TID WC   pantoprazole  40 mg Oral BID   sodium chloride flush  10 mL Intravenous Q12H   sodium chloride flush  10-40 mL Intracatheter Q12H   tamsulosin  0.4 mg Oral QPC supper   Continuous Infusions:  furosemide (LASIX) 200 mg in dextrose 5 % 100 mL (2 mg/mL) infusion 4 mg/hr (12/18/22 2210)   PRN Meds: acetaminophen **OR** acetaminophen, ALPRAZolam, diltiazem, HYDROcodone-acetaminophen, ondansetron **OR** ondansetron (ZOFRAN) IV, mouth rinse, sodium chloride flush  Time spent: 55 minutes  Author: Gillis Santa. MD [facility] 12/19/2022 4:43 PM  To reach On-call, see care teams to locate the attending and reach out to them via [email or web address]. If 7PM-7AM, please contact night-coverage If you still have difficulty reaching the attending provider, please page the [facility] (Director on Call) for [facility] on amion for assistance.

## 2022-12-19 NOTE — Plan of Care (Signed)
  Problem: Education: Goal: Knowledge of General Education information will improve Description: Including pain rating scale, medication(s)/side effects and non-pharmacologic comfort measures Outcome: Progressing   Problem: Health Behavior/Discharge Planning: Goal: Ability to manage health-related needs will improve Outcome: Progressing   Problem: Clinical Measurements: Goal: Ability to maintain clinical measurements within normal limits will improve Outcome: Progressing Goal: Will remain free from infection Outcome: Progressing Goal: Diagnostic test results will improve Outcome: Progressing Goal: Respiratory complications will improve Outcome: Progressing Goal: Cardiovascular complication will be avoided Outcome: Progressing   Problem: Nutrition: Goal: Adequate nutrition will be maintained Outcome: Progressing   Problem: Elimination: Goal: Will not experience complications related to bowel motility Outcome: Progressing

## 2022-12-19 NOTE — Progress Notes (Signed)
[facility]  ROUNDING NOTE   Subjective:   Patient seen and evaluated at bedside. Reports feeling palpitations though heart rate is in the 70s at the moment. Creatinine currently 4.08. Good urine output at 2.1 L over the preceding 24 hours. 12/11 0701 - 12/12 0700 In: 1397.2 [P.O.:1210; I.V.:187.2] Out: 2150 [Urine:2150] Lab Results  Component Value Date   CREATININE 4.08 (H) 12/19/2022   CREATININE 4.12 (H) 12/18/2022   CREATININE 3.74 (H) 12/17/2022     Objective:  Vital signs in last 24 hours:  Temp:  [97.6 F (36.4 C)-98.5 F (36.9 C)] 97.6 F (36.4 C) (12/12 0000) Pulse Rate:  [72-96] 72 (12/12 0400) Resp:  [16-24] 21 (12/12 0400) BP: (114-132)/(63-74) 132/66 (12/12 0400) SpO2:  [95 %-99 %] 96 % (12/12 0400)  Weight change:  Filed Weights   12/11/22 2135  Weight: 76.2 kg    Intake/Output: I/O last 3 completed shifts: In: 1759.2 [P.O.:1450; I.V.:309.2] Out: 3050 [Urine:3050]   Intake/Output this shift:  No intake/output data recorded.  Physical Exam: General: NAD  Head: Normocephalic, atraumatic. Moist oral mucosal membranes  Eyes: Anicteric  Neck: Supple, trachea midline  Lungs:  Clear to auscultation  Heart: Regular rate and rhythm  Abdomen:  Soft, nontender,   Extremities: 1+ bilateral lower extremity edema  Neurologic: Alert and oriented x 4  Skin: No lesions  Access: Left AVF + thrill and +bruit    Basic Metabolic Panel: Recent Labs  Lab 12/14/22 1007 12/14/22 2044 12/15/22 0341 12/16/22 0445 12/17/22 0507 12/18/22 0612 12/19/22 0501  NA  --   --  141 140 139 139 138  K  --   --  4.0 3.8 3.9 3.8 3.8  CL  --   --  105 101 102 103 104  CO2  --   --  26 30 29 25 25   GLUCOSE  --   --  153* 118* 87 86 88  BUN  --   --  55* 56* 46* 46* 46*  CREATININE  --   --  3.72* 3.72* 3.74* 4.12* 4.08*  CALCIUM  --   --  7.2* 6.9* 6.8* 7.1* 7.0*  MG 1.6*  --  2.2 2.0 1.9  --  1.9  PHOS  --  3.9 4.2 3.7 3.6  --  4.3    Liver  Function Tests: Recent Labs  Lab 12/13/22 0537 12/13/22 1949  AST 12* 15  ALT 11 8  ALKPHOS 45 33*  BILITOT 1.1 0.9  PROT 3.7* 3.0*  ALBUMIN 2.3* 1.8*   No results for input(s): "LIPASE", "AMYLASE" in the last 168 hours. No results for input(s): "AMMONIA" in the last 168 hours.  CBC: Recent Labs  Lab 12/13/22 1949 12/14/22 0149 12/14/22 0435 12/15/22 0341 12/15/22 1405 12/16/22 0445 12/17/22 0507 12/18/22 0612 12/19/22 0501  WBC 7.6 12.8*   < > 8.0  --  6.0 5.6 6.5 5.8  NEUTROABS 6.0 10.2*  --   --   --   --   --   --   --   HGB 9.2* 7.3*   < > 7.3* 7.7* 7.4* 8.5* 9.2* 8.7*  HCT 27.4* 21.3*   < > 21.6* 22.3* 22.4* 25.2* 28.7* 27.0*  MCV 89.3 87.3   < > 87.4  --  89.6 90.0 92.3 91.8  PLT 81* 102*   < > 89*  --  94* 101* 105* 107*   < > = values in this interval not displayed.    Cardiac Enzymes: No results for  input(s): "CKTOTAL", "CKMB", "CKMBINDEX", "TROPONINI" in the last 168 hours.  BNP: Invalid input(s): "POCBNP"  CBG: Recent Labs  Lab 12/15/22 0637 12/15/22 0748 12/15/22 1145 12/15/22 1558 12/15/22 1928  GLUCAP 129* 140* 190* 151* 117*    Microbiology: Results for orders placed or performed during the hospital encounter of 12/12/22  MRSA Next Gen by PCR, Nasal     Status: None   Collection Time: 12/12/22  5:28 PM   Specimen: Nasal Mucosa; Nasal Swab  Result Value Ref Range Status   MRSA by PCR Next Gen NOT DETECTED NOT DETECTED Final    Comment: (NOTE) The GeneXpert MRSA Assay (FDA approved for NASAL specimens only), is one component of a comprehensive MRSA colonization surveillance program. It is not intended to diagnose MRSA infection nor to guide or monitor treatment for MRSA infections. Test performance is not FDA approved in patients less than 30 years old. Performed at [facility], [address] [place], [place] [address]   Culture, blood (Routine X 2) w Reflex to ID Panel     Status: None   Collection Time: 12/13/22  7:42  PM   Specimen: BLOOD  Result Value Ref Range Status   Specimen Description BLOOD BLOOD LEFT ARM  Final   Special Requests   Final    BOTTLES DRAWN AEROBIC AND ANAEROBIC Blood Culture results may not be optimal due to an excessive volume of blood received in culture bottles   Culture   Final    NO GROWTH 5 DAYS Performed at [facility], [address], [place], [place] [address]    Report Status 12/18/2022 FINAL  Final  Culture, blood (Routine X 2) w Reflex to ID Panel     Status: None   Collection Time: 12/13/22  9:35 PM   Specimen: BLOOD  Result Value Ref Range Status   Specimen Description BLOOD BLOOD RIGHT ARM  Final   Special Requests   Final    BOTTLES DRAWN AEROBIC AND ANAEROBIC Blood Culture adequate volume   Culture   Final    NO GROWTH 5 DAYS Performed at [facility], [address] [place], [place] [address]    Report Status 12/18/2022 FINAL  Final    Coagulation Studies: Recent Labs    12/17/22 1148  LABPROT 15.3*  INR 1.2     Urinalysis: No results for input(s): "COLORURINE", "LABSPEC", "PHURINE", "GLUCOSEU", "HGBUR", "BILIRUBINUR", "KETONESUR", "PROTEINUR", "UROBILINOGEN", "NITRITE", "LEUKOCYTESUR" in the last 72 hours.  Invalid input(s): "APPERANCEUR"    Imaging: EEG adult Result Date: 12/17/2022 Charlsie Quest, MD     12/17/2022 10:21 AM Patient Name: Terry Ano Jaquess Sr. MRN: [id number] Epilepsy Attending: Charlsie Quest Referring Physician/Provider: Milon Dikes, MD Date: 12/16/2022 Duration: 31.04 mins  Patient history: 82yo M with left gaze deviation and right-sided weakness which resolved quickly. EEG to evaluate for seizure  Level of alertness: Awake, asleep  AEDs during EEG study: None  Technical aspects: This EEG study was done with scalp electrodes positioned according to the 10-20 International system of electrode placement. Electrical activity was reviewed with band pass filter of 1-70Hz , sensitivity of 7 uV/mm, display  speed of 13mm/sec with a 60Hz  notched filter applied as appropriate. EEG data were recorded continuously and digitally stored.  Video monitoring was available and reviewed as appropriate.  Description: The posterior dominant rhythm consists of 8 Hz activity of moderate voltage (25-35 uV) seen predominantly in posterior head regions, symmetric and reactive to eye opening and eye closing. Sleep was characterized  by vertex waves, sleep spindles (12 to 14 Hz), maximal frontocentral region. Hyperventilation and photic stimulation were not performed.    IMPRESSION: This study is within normal limits. No seizures or epileptiform discharges were seen throughout the recording.  A normal interictal EEG does not exclude the diagnosis of epilepsy.   Priyanka Annabelle Harman      Medications:    furosemide (LASIX) 200 mg in dextrose 5 % 100 mL (2 mg/mL) infusion 4 mg/hr (12/18/22 2210)    apixaban  2.5 mg Oral BID   atorvastatin  20 mg Oral Daily   Chlorhexidine Gluconate Cloth  6 each Topical Daily   midodrine  10 mg Oral TID WC   pantoprazole  40 mg Oral BID   sodium chloride flush  10 mL Intravenous Q12H   sodium chloride flush  10-40 mL Intracatheter Q12H   tamsulosin  0.4 mg Oral QPC supper   acetaminophen **OR** acetaminophen, ALPRAZolam, diltiazem, HYDROcodone-acetaminophen, ondansetron **OR** ondansetron (ZOFRAN) IV, mouth rinse, sodium chloride flush  Assessment/ Plan:  Mr. MARGIE WOLD Sr. is a 82 y.o.  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 Baseline creatinine 5.2 with GFR of 10 on 09/23/22.  -Good urine output at the moment at 2.2 L.  Creatinine relatively stable.   Okay to continue Lasix drip for now.   Hypotension with volume loss:  -Remains off pressors at the moment.   Anemia with acute blood loss, iron deficiency and anemia of chronic kidney disease - status post PRBC transfusions -Hemoglobin down slightly to 8.7.   LOS: 7 Kaelynne Christley 12/12/20248:03 AM

## 2022-12-19 NOTE — Plan of Care (Signed)
  Problem: Education: Goal: Knowledge of General Education information will improve Description: Including pain rating scale, medication(s)/side effects and non-pharmacologic comfort measures Outcome: Progressing   Problem: Clinical Measurements: Goal: Ability to maintain clinical measurements within normal limits will improve Outcome: Progressing Goal: Will remain free from infection Outcome: Progressing Goal: Cardiovascular complication will be avoided Outcome: Progressing   Problem: Activity: Goal: Risk for activity intolerance will decrease Outcome: Progressing   Problem: Coping: Goal: Level of anxiety will decrease Outcome: Progressing   Problem: Elimination: Goal: Will not experience complications related to urinary retention Outcome: Progressing   Problem: Pain Management: Goal: General experience of comfort will improve Outcome: Progressing   Problem: Safety: Goal: Ability to remain free from injury will improve Outcome: Progressing

## 2022-12-19 NOTE — Progress Notes (Signed)
Patient resting in bed. Patient states he feels anxious, feels like his heart is racing/ having palpitations and he is going to pass out. HR in 70s, BP stable in 130s/60s. EKG obtained, in chart. MD notify. New PRN meds given. MD to come to bedside.   Patient resting in bed, woke up feeling better from anxious episode this morning.   Family at bedside, updated.  Patient ambulated to chair with assistance. Noted difference in patients rhythm while sitting in chair. EKG in chart, MD made aware.

## 2022-12-20 DIAGNOSIS — K91840 Postprocedural hemorrhage and hematoma of a digestive system organ or structure following a digestive system procedure: Secondary | ICD-10-CM | POA: Diagnosis not present

## 2022-12-20 LAB — CBC
HCT: 28.1 % — ABNORMAL LOW (ref 39.0–52.0)
Hemoglobin: 9 g/dL — ABNORMAL LOW (ref 13.0–17.0)
MCH: 29.2 pg (ref 26.0–34.0)
MCHC: 32 g/dL (ref 30.0–36.0)
MCV: 91.2 fL (ref 80.0–100.0)
Platelets: 123 10*3/uL — ABNORMAL LOW (ref 150–400)
RBC: 3.08 MIL/uL — ABNORMAL LOW (ref 4.22–5.81)
RDW: 16.6 % — ABNORMAL HIGH (ref 11.5–15.5)
WBC: 6 10*3/uL (ref 4.0–10.5)
nRBC: 0 % (ref 0.0–0.2)

## 2022-12-20 LAB — BASIC METABOLIC PANEL
Anion gap: 9 (ref 5–15)
BUN: 47 mg/dL — ABNORMAL HIGH (ref 8–23)
CO2: 25 mmol/L (ref 22–32)
Calcium: 7.2 mg/dL — ABNORMAL LOW (ref 8.9–10.3)
Chloride: 102 mmol/L (ref 98–111)
Creatinine, Ser: 4.32 mg/dL — ABNORMAL HIGH (ref 0.61–1.24)
GFR, Estimated: 13 mL/min — ABNORMAL LOW (ref >60)
Glucose, Bld: 83 mg/dL (ref 70–99)
Potassium: 4.1 mmol/L (ref 3.5–5.1)
Sodium: 136 mmol/L (ref 135–145)

## 2022-12-20 MED ORDER — FUROSEMIDE 20 MG PO TABS: 40.0000 mg | ORAL_TABLET | Freq: Two times a day (BID) | ORAL | Status: DC

## 2022-12-20 MED ORDER — FUROSEMIDE 20 MG PO TABS
40.0000 mg | ORAL_TABLET | Freq: Two times a day (BID) | ORAL | Status: DC
  Administered 2022-12-20: 40 mg via ORAL
  Filled 2022-12-20: qty 2

## 2022-12-20 MED ORDER — METOPROLOL TARTRATE 25 MG PO TABS: 25.0000 mg | ORAL_TABLET | Freq: Two times a day (BID) | ORAL | 2 refills | Status: DC

## 2022-12-20 NOTE — Progress Notes (Signed)
[facility]  ROUNDING NOTE   Subjective:   Patient seen earlier in the day prior to discharge. Good response to diuretics. Creatinine currently 4.3. 12/12 0701 - 12/13 0700 In: 885.4 [P.O.:820; I.V.:65.4] Out: 4100 [Urine:4100] Lab Results  Component Value Date   CREATININE 4.32 (H) 12/20/2022   CREATININE 4.08 (H) 12/19/2022   CREATININE 4.12 (H) 12/18/2022     Objective:  Vital signs in last 24 hours:  Temp:  [98 F (36.7 C)-98.6 F (37 C)] 98.1 F (36.7 C) (12/13 0800) Pulse Rate:  [81-91] 90 (12/13 1135) Resp:  [17-25] 25 (12/13 1135) BP: (106-130)/(62-78) 116/69 (12/13 1135) SpO2:  [94 %-98 %] 94 % (12/13 1135)  Weight change:  Filed Weights   12/11/22 2135  Weight: 76.2 kg    Intake/Output: I/O last 3 completed shifts: In: 1074.7 [P.O.:970; I.V.:104.7] Out: 5600 [Urine:5600]   Intake/Output this shift:  Total I/O In: 607.3 [P.O.:600; I.V.:7.3] Out: 1275 [Urine:1275]  Physical Exam: General: NAD  Head: Normocephalic, atraumatic. Moist oral mucosal membranes  Eyes: Anicteric  Neck: Supple, trachea midline  Lungs:  Clear to auscultation  Heart: Regular rate and rhythm  Abdomen:  Soft, nontender,   Extremities: 1+ bilateral lower extremity edema  Neurologic: Alert and oriented x 4  Skin: No lesions  Access: Left AVF + thrill and +bruit    Basic Metabolic Panel: Recent Labs  Lab 12/14/22 1007 12/14/22 2044 12/15/22 0341 12/16/22 0445 12/17/22 0507 12/18/22 0612 12/19/22 0501 12/20/22 0405  NA  --   --  141 140 139 139 138 136  K  --   --  4.0 3.8 3.9 3.8 3.8 4.1  CL  --   --  105 101 102 103 104 102  CO2  --   --  26 30 29 25 25 25   GLUCOSE  --   --  153* 118* 87 86 88 83  BUN  --   --  55* 56* 46* 46* 46* 47*  CREATININE  --   --  3.72* 3.72* 3.74* 4.12* 4.08* 4.32*  CALCIUM  --   --  7.2* 6.9* 6.8* 7.1* 7.0* 7.2*  MG 1.6*  --  2.2 2.0 1.9  --  1.9  --   PHOS  --  3.9 4.2 3.7 3.6  --  4.3  --     Liver Function  Tests: Recent Labs  Lab 12/13/22 1949  AST 15  ALT 8  ALKPHOS 33*  BILITOT 0.9  PROT 3.0*  ALBUMIN 1.8*   No results for input(s): "LIPASE", "AMYLASE" in the last 168 hours. No results for input(s): "AMMONIA" in the last 168 hours.  CBC: Recent Labs  Lab 12/13/22 1949 12/14/22 0149 12/14/22 0435 12/16/22 0445 12/17/22 0507 12/18/22 0612 12/19/22 0501 12/20/22 0405  WBC 7.6 12.8*   < > 6.0 5.6 6.5 5.8 6.0  NEUTROABS 6.0 10.2*  --   --   --   --   --   --   HGB 9.2* 7.3*   < > 7.4* 8.5* 9.2* 8.7* 9.0*  HCT 27.4* 21.3*   < > 22.4* 25.2* 28.7* 27.0* 28.1*  MCV 89.3 87.3   < > 89.6 90.0 92.3 91.8 91.2  PLT 81* 102*   < > 94* 101* 105* 107* 123*   < > = values in this interval not displayed.    Cardiac Enzymes: No results for input(s): "CKTOTAL", "CKMB", "CKMBINDEX", "TROPONINI" in the last 168 hours.  BNP: Invalid input(s): "POCBNP"  CBG: Recent Labs  Lab 12/15/22 0637 12/15/22 0748 12/15/22 1145 12/15/22 1558 12/15/22 1928  GLUCAP 129* 140* 190* 151* 117*    Microbiology: Results for orders placed or performed during the hospital encounter of 12/12/22  MRSA Next Gen by PCR, Nasal     Status: None   Collection Time: 12/12/22  5:28 PM   Specimen: Nasal Mucosa; Nasal Swab  Result Value Ref Range Status   MRSA by PCR Next Gen NOT DETECTED NOT DETECTED Final    Comment: (NOTE) The GeneXpert MRSA Assay (FDA approved for NASAL specimens only), is one component of a comprehensive MRSA colonization surveillance program. It is not intended to diagnose MRSA infection nor to guide or monitor treatment for MRSA infections. Test performance is not FDA approved in patients less than 11 years old. Performed at [facility], [address] [place], [place] [address]   Culture, blood (Routine X 2) w Reflex to ID Panel     Status: None   Collection Time: 12/13/22  7:42 PM   Specimen: BLOOD  Result Value Ref Range Status   Specimen Description BLOOD BLOOD  LEFT ARM  Final   Special Requests   Final    BOTTLES DRAWN AEROBIC AND ANAEROBIC Blood Culture results may not be optimal due to an excessive volume of blood received in culture bottles   Culture   Final    NO GROWTH 5 DAYS Performed at [facility], [address] [place], [place] [address]    Report Status 12/18/2022 FINAL  Final  Culture, blood (Routine X 2) w Reflex to ID Panel     Status: None   Collection Time: 12/13/22  9:35 PM   Specimen: BLOOD  Result Value Ref Range Status   Specimen Description BLOOD BLOOD RIGHT ARM  Final   Special Requests   Final    BOTTLES DRAWN AEROBIC AND ANAEROBIC Blood Culture adequate volume   Culture   Final    NO GROWTH 5 DAYS Performed at [facility], [address], [place], [place] [address]    Report Status 12/18/2022 FINAL  Final    Coagulation Studies: No results for input(s): "LABPROT", "INR" in the last 72 hours.    Urinalysis: No results for input(s): "COLORURINE", "LABSPEC", "PHURINE", "GLUCOSEU", "HGBUR", "BILIRUBINUR", "KETONESUR", "PROTEINUR", "UROBILINOGEN", "NITRITE", "LEUKOCYTESUR" in the last 72 hours.  Invalid input(s): "APPERANCEUR"    Imaging: No results found.    Medications:      apixaban  2.5 mg Oral BID   atorvastatin  20 mg Oral Daily   Chlorhexidine Gluconate Cloth  6 each Topical Daily   furosemide  40 mg Oral BID   midodrine  10 mg Oral TID WC   pantoprazole  40 mg Oral BID   sodium chloride flush  10 mL Intravenous Q12H   sodium chloride flush  10-40 mL Intracatheter Q12H   tamsulosin  0.4 mg Oral QPC supper   acetaminophen **OR** acetaminophen, ALPRAZolam, diltiazem, HYDROcodone-acetaminophen, ondansetron **OR** ondansetron (ZOFRAN) IV, mouth rinse, sodium chloride flush  Assessment/ Plan:  [name] is a 82 y.o.  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 Baseline creatinine 5.2 with GFR of 10 on 09/23/22.  -Creatinine currently 4.32 and still below baseline.  Good urine output noted in response to diuretics.  He will need close outpatient follow-up given relatively low renal function.   Hypotension with volume loss:  -Resolved and off pressors.  Continue midodrine.   Anemia with acute blood loss, iron deficiency and anemia of chronic kidney disease - status post PRBC transfusions -Consider Epogen as outpatient.   LOS: 8 Cher Egnor 12/13/20246:51 PM

## 2022-12-20 NOTE — Progress Notes (Signed)
Patient's wife and son at bedside; to provide transport to home. Pt dressing. DC education has been provided; all questions answered. Pt alert and oriented.

## 2022-12-20 NOTE — Discharge Summary (Signed)
[facility] Discharge Summary   Patient: Terry Stephens FAO:[id number]  PCP: Jaclyn Shaggy, MD  Date of admission: 12/12/2022   Date of discharge:  12/20/2022     Discharge Diagnoses:  Principal Problem:   Colonoscopy with polypectomy 12/11/22 with post-procedural bleeding Active Problems:   Hematochezia   Chronic anticoagulation   IDA (iron deficiency anemia)   Anemia in chronic kidney disease (CKD)   A-fib (HCC)   CAD s/p CABG (coronary artery disease)   Hypertension   CKD (chronic kidney disease) stage 5, GFR less than 15 ml/min (HCC)   GI bleed   Rectal bleeding   Admitted From: Home Disposition:  Home with Terry Stephens  Recommendations for Outpatient Follow-up:  Follow-up with PCP in 1 week, continue to monitor BP at home and follow with PCP to titrate medication accordingly.  Discontinued losartan and Toprol-XL 100 mg due to low blood pressure.  Patient received midodrine during hospital stay, no more needs blood pressure is still soft.  Started Lopressor 25 mg p.o. twice daily with holding parameters to hold if systolic BP less than 120 mmHg or heart rate less than 65. With GI in 1 week for biopsy report Follow-up with nephrology in 1 to 2 weeks.  Resumed home dose Lasix.  Monitor renal functions and BP. Follow up LABS/TEST:  CBC and BMP-in 1 wk   Follow-up Information     Jaclyn Shaggy, MD Follow up in 1 week(s).   Specialty: Internal Medicine Contact information: [address]   [place] [address] [phone]         Lucille Passy, MD Follow up in 2 week(s).   Specialty: Nephrology Contact information: [address] [facility] [address] [facility] [place] [address] [phone]                Diet recommendation: Cardiac/Renal diet  Activity: The patient is advised to gradually reintroduce usual activities, as tolerated  Discharge Condition: stable  Code Status: Full code   History of present illness: As per the H and P  dictated on admission. Hospital Course:  Terry Annis Ginsberg Sr. is a 82 y.o. 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  He underwent elective EGD and Colonoscopy on 12/11/22 for workup of iron deficiency anemia, which required snare polypectomy of 15 polyps with placement of multiple hemostatic clips. He reported that several hours after the procedure he felt like he was oozing blood and began passing dark clots. He endorsed mild lightheadedness but denies chest pain, shortness of breath or palpitations, but denied abdominal pain. Of note, patient stopped taking Eliquis since 12/2 preparation for his colonoscopy and has not yet resumed. He was however taking a baby aspirin as advised by his cardiologist who did preprocedural cardiac clearance.    Significant Hospital Events:  12/4: Underwent elective EGD and Colostomy for evaluation of IDA, with polypectomy.  Went home post procedure, however in the following hours at home, passing bright red blood and clots from rectum.  Went to ED for evaluation. 12/5: [facility] admitted. GI consulted. Received enema for repeat colonoscopy with loss of large amount of bright red blood per rectum with associated hypotension.  Was given fluids and  2 units blood, required low dose peripheral Levophed 12/6: Repeat Colonoscopy performed.  Late in the evening, again Hypotensive, given 1 unit of blood and placed on Levophed. PCCM consulted,  Central line placed. 12/7 severe hypovolumic shock 12/8 code stroke was called due to left gaze and right-sided weakness, not a tPA candidate, MRI brain negative 12/9 patient was stabilized and transferred to [facility] service    Further management as below   Assessment and Plan:   # Hypovolemic shock due to acute blood loss anemia Patient was transferred to ICU for pressor support, Levophed gradually weaned off. S/p midodrine 10 mg p.o. TID.  BP improved, discontinued midodrine.  Started low-dose metoprolol as below for CHF and A-fib. # Lower GI bleeding s/p polypectomy S/p repeat colonoscopy on 12/6 with active bleeding in cecum requiring three hemostatic clips for endoscopic hemostasis. S/p pantoprazole 40 mg twice daily.  Resumed PPI home dose on discharge. S/p full liquid diet, advance to soft diet.  H&H remained stable, no more bleeding.  Resumed home dose Eliquis 2.5 mg p.o. twice daily.  Recommended to follow-up with GI in 1 week for biopsy report. # Acute blood loss anemia: S/p multiple PRBC transfusions 12/9 Hb 7.4, transfuse 1 unit of PRBC to keep Hb >8 On 12/11 resume Eliquis, On 12/12 Hb 8.7, no GI bleeding. 12/13 Hb 9.0 stable. # Transient left gaze and right-sided weakness, resolved, unknown cause. CT head code stroke no ICH, normal for age, intracranial artery toxicity and calcified atherosclerosis. CTA head and neck: Negative CT perfusion and negative for large vessel occlusion. MRI brain: No acute intracranial process. No evidence of acute or subacute infarct. S/p Keppra 250 IV every 12 hourly. EEG negative. Neurology consulted, recommended no need of AED, DC'd Keppra.   # Metabolic acidosis due to CKD and lactic acidosis.  Resolved # CKD stage V (new baseline Cr 3.72 eGFR 15 on 12/14/22) S/p bicarb IV infusion and IVF, DC'd IV fluid due to edema Creatinine 4.32, nephrology was consulted.  Patient was cleared to discharge on Lasix 40 mg p.o. twice daily home dose.  Follow-up with nephrology in 1 to 2 weeks.  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 Held Eliquis due to GI  bleed. S/p vasopressors, continue midodrine to support BP 12/10 noted significant edema of right arm and edema of genitalia.  Discussed with nephrology, discontinued IV fluid, started Lasix IV infusion, edema resolved.  Transition to oral Lasix on discharge. 12/10 s/p heparin IV infusion without bolus, H&H remained stable, no bleeding, discontinued heparin drip on 12/11 12/11 started Eliquis 2.5 mg p.o. twice daily.  # Left upper lobe peribronchial opacity Incidental finding on CTA head and neck Recommend to follow-up with PCP and pulmonary as an outpatient   Body mass index is 24.81 kg/m.  Nutrition Interventions:  Patient was seen by physical therapy, who recommended Home health, which was arranged. On the day of the discharge the patient's vitals were stable, and no other acute medical condition were reported by patient. the patient was felt safe to be discharge at Home with Home health.  Consultants: Cardiology, PCCM, GI, Neurology Procedures: s/p EGD And Colonoscopy as above   Discharge Exam: General: Appear in no distress, no Rash; Oral Mucosa Clear, moist. Cardiovascular: S1 and S2 Present, audible Murmur, Respiratory: normal respiratory effort, Bilateral Air entry present and no Crackles, no wheezes Abdomen: Bowel  Sound present, Soft and no tenderness, no hernia Extremities: no Pedal edema, no calf tenderness Neurology: alert and oriented to time, place, and person affect appropriate.  Filed Weights   12/11/22 2135  Weight: 76.2 kg   Vitals:   12/20/22 0800 12/20/22 1135  BP: 129/68 116/69  Pulse: 88 90  Resp: 19 (!) 25  Temp: 98.1 F (36.7 C)   SpO2: 96% 94%    DISCHARGE MEDICATION: Allergies as of 12/20/2022       Reactions   Phenothiazines Other (See Comments)   Extra-pyramidal reaction.  Extra-pyramidal reaction   Prochlorperazine Edisylate         Medication List     STOP taking these medications    amoxicillin 500 MG tablet Commonly known as:  AMOXIL   losartan 50 MG tablet Commonly known as: COZAAR   metoprolol succinate 100 MG 24 hr tablet Commonly known as: TOPROL-XL       TAKE these medications    acetaminophen 500 MG tablet Commonly known as: TYLENOL Take 1,000 mg by mouth every 6 (six) hours as needed for mild pain (pain score 1-3).   atorvastatin 20 MG tablet Commonly known as: Lipitor Take 1 tablet (20 mg total) by mouth daily.   diclofenac Sodium 1 % Gel Commonly known as: VOLTAREN Apply topically 4 (four) times daily as needed.   Eliquis 2.5 MG Tabs tablet Generic drug: apixaban Take 1 tablet (2.5 mg total) by mouth 2 (two) times daily.   esomeprazole 40 MG capsule Commonly known as: NEXIUM Take 40 mg by mouth as needed.   fluticasone 50 MCG/ACT nasal spray Commonly known as: FLONASE Place 2 sprays into both nostrils daily.   furosemide 40 MG tablet Commonly known as: LASIX Take 1 tablet (40 mg total) by mouth 2 (two) times daily.   Metamucil 0.52 g capsule Generic drug: psyllium Take 0.52 g by mouth daily.   metoprolol tartrate 25 MG tablet Commonly known as: LOPRESSOR Take 1 tablet (25 mg total) by mouth 2 (two) times daily. Hold if SBP <120 mmHg and or HR <65   Protopic 0.1 % ointment Generic drug: tacrolimus Apply topically.   Saw Palmetto 450 MG Caps Take 450 mg by mouth daily.   sodium bicarbonate 650 MG tablet Take 650 mg by mouth 2 (two) times daily.   tamsulosin 0.4 MG Caps capsule Commonly known as: FLOMAX Take 0.4 mg by mouth daily after supper.   Vitamin D3 50 MCG (2000 UT) capsule Take 2,000 Units by mouth daily.               Durable Medical Equipment  (From admission, onward)           Start     Ordered   12/20/22 1117  For home use only DME Walker rolling  Once       Question Answer Comment  Walker: With 5 Inch Wheels   Patient needs a walker to treat with the following condition Age-related physical debility      12/20/22 1117            Allergies  Allergen Reactions   Phenothiazines Other (See Comments)    Extra-pyramidal reaction.   Extra-pyramidal reaction   Prochlorperazine Edisylate    Discharge Instructions     Call MD for:   Complete by: As directed    Recurrent GI bleeding   Call MD for:  difficulty breathing, headache or visual disturbances   Complete by: As directed    Call MD  for:  extreme fatigue   Complete by: As directed    Call MD for:  persistant dizziness or light-headedness   Complete by: As directed    Call MD for:  persistant nausea and vomiting   Complete by: As directed    Call MD for:  severe uncontrolled pain   Complete by: As directed    Call MD for:  temperature >100.4   Complete by: As directed    Diet - low sodium heart healthy   Complete by: As directed    Discharge instructions   Complete by: As directed    Follow-up with PCP in 1 week, continue to monitor BP at home and follow with PCP to titrate medication accordingly.  Discontinued losartan and Toprol-XL 100 mg due to low blood pressure.  Patient received midodrine during hospital stay, no more needs blood pressure is still soft.  Started Lopressor 25 mg p.o. twice daily with holding parameters to hold if systolic BP less than 120 mmHg or heart rate less than 65. With GI in 1 week for biopsy report Follow-up with nephrology in 1 to 2 weeks.  Resumed home dose Lasix.  Monitor renal functions and BP.   Increase activity slowly   Complete by: As directed        The results of significant diagnostics from this hospitalization (including imaging, microbiology, ancillary and laboratory) are listed below for reference.    Significant Diagnostic Studies: EEG adult Result Date: 12/17/2022 Charlsie Quest, MD     12/17/2022 10:21 AM Patient Name: Dionne Ano Davidow Sr. MRN: [id number] Epilepsy Attending: Charlsie Quest Referring Physician/Provider: Milon Dikes, MD Date: 12/16/2022 Duration: 31.04 mins  Patient history: 82yo M  with left gaze deviation and right-sided weakness which resolved quickly. EEG to evaluate for seizure  Level of alertness: Awake, asleep  AEDs during EEG study: None  Technical aspects: This EEG study was done with scalp electrodes positioned according to the 10-20 International system of electrode placement. Electrical activity was reviewed with band pass filter of 1-70Hz , sensitivity of 7 uV/mm, display speed of 53mm/sec with a 60Hz  notched filter applied as appropriate. EEG data were recorded continuously and digitally stored.  Video monitoring was available and reviewed as appropriate.  Description: The posterior dominant rhythm consists of 8 Hz activity of moderate voltage (25-35 uV) seen predominantly in posterior head regions, symmetric and reactive to eye opening and eye closing. Sleep was characterized by vertex waves, sleep spindles (12 to 14 Hz), maximal frontocentral region. Hyperventilation and photic stimulation were not performed.    IMPRESSION: This study is within normal limits. No seizures or epileptiform discharges were seen throughout the recording.  A normal interictal EEG does not exclude the diagnosis of epilepsy.   Charlsie Quest    MR BRAIN WO CONTRAST Result Date: 12/15/2022 CLINICAL DATA:  Temporary episode of unresponsiveness EXAM: MRI HEAD WITHOUT CONTRAST TECHNIQUE: Multiplanar, multiecho pulse sequences of the brain and surrounding structures were obtained without intravenous contrast. COMPARISON:  No prior MRI available, correlation is made with 12/15/2022 CT head FINDINGS: Brain: No restricted diffusion to suggest acute or subacute infarct. No acute hemorrhage, mass, mass effect, or midline shift. No hydrocephalus or extra-axial collection. Partial empty sella. Normal craniocervical junction. Scattered foci of hemosiderin deposition in the bilateral cerebral hemispheres. Remote lacunar infarcts in the bilateral corona radiata. Scattered T2 hyperintense signal in the  periventricular white matter, likely the sequela of mild chronic small vessel ischemic disease. Vascular: Normal arterial flow voids. Skull and  upper cervical spine: Normal marrow signal. Sinuses/Orbits: Mucosal thickening in the left maxillary sinus. Status post bilateral lens replacements. No acute finding in the orbits. IMPRESSION: No acute intracranial process. No evidence of acute or subacute infarct. Electronically Signed   By: Wiliam Ke M.D.   On: 12/15/2022 20:55   ECHOCARDIOGRAM COMPLETE Result Date: 12/15/2022    ECHOCARDIOGRAM REPORT   Patient Name:   FREDREICK SATTERWHITE Sr. Date of Exam: 12/14/2022 Medical Rec #:  [id number]              Height:       69.0 in Accession #:    [id number]             Weight:       168.0 lb Date of Birth:  May 05, 1940              BSA:          1.918 m Patient Age:    82 years               BP:           113/70 mmHg Patient Gender: M                      HR:           84 bpm. Exam Location:  [facility] Procedure: 2D Echo, Cardiac Doppler and Color Doppler Indications:     Shock R57.9  History:         Patient has prior history of Echocardiogram examinations. CAD;                  Arrythmias:Atrial Fibrillation.                  Aortic Valve: unknown bioprosthetic valve is present in the                  aortic position. Procedure Date: 2012.  Sonographer:     Neysa Bonito Roar Referring Phys:  [id number] Judithe Modest Diagnosing Phys: Jodelle Red MD IMPRESSIONS  1. Left ventricular ejection fraction, by estimation, is 50 to 55%. The left ventricle has low normal function. The left ventricle demonstrates regional wall motion abnormalities (see scoring diagram/findings for description). There is mild left ventricular hypertrophy. Left ventricular diastolic parameters are consistent with Grade II diastolic dysfunction (pseudonormalization). There is hypokinesis of the left ventricular, mid-apical inferoseptal wall, inferior wall and apical segment.  2. Right ventricular  systolic function is normal. The right ventricular size is moderately enlarged. There is severely elevated pulmonary artery systolic pressure. The estimated right ventricular systolic pressure is 64.3 mmHg.  3. Left atrial size was moderately dilated.  4. Right atrial size was severely dilated.  5. The mitral valve is degenerative. Mild mitral valve regurgitation. Mild mitral stenosis.  6. Tricuspid valve regurgitation is severe.  7. The aortic valve has been repaired/replaced. There is moderate calcification of the aortic valve. There is moderate thickening of the aortic valve. Aortic valve regurgitation is trivial. 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 Procedure Date: 2012.  8. 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  9. The inferior vena cava is dilated in size with <50% respiratory variability, suggesting right atrial pressure of 15 mmHg. Comparison(s): Prior images unable to be directly viewed, comparison made by report only. Echo from [facility] 03/02/22 noted EF 40%, prior echo 2023 with regional wall motion abnormalities in the septum, apex,  and inferior walls, moderate RV enlargement with mildly decreased function, bioprosthetic aortic valve. Conclusion(s)/Recommendation(s): LV appears similar to description from [facility] echo in 2024. There is severe TR, severely enlarged RA, and severe pulmonary hypertension. Pulmonary hypertension is worse compared to prior report. FINDINGS  Left Ventricle: Left ventricular ejection fraction, by estimation, is 50 to 55%. The left ventricle has low normal function. The left ventricle demonstrates regional wall motion abnormalities. The left ventricular internal cavity size was normal in size. There is mild left ventricular hypertrophy. Left ventricular diastolic parameters are consistent with Grade II diastolic dysfunction (pseudonormalization). Right Ventricle: The right ventricular size is moderately  enlarged. Right vetricular wall thickness was not well visualized. Right ventricular systolic function is normal. There is severely elevated pulmonary artery systolic pressure. The tricuspid regurgitant velocity is 3.51 m/s, and with an assumed right atrial pressure of 15 mmHg, the estimated right ventricular systolic pressure is 64.3 mmHg. Left Atrium: Left atrial size was moderately dilated. Right Atrium: Right atrial size was severely dilated. Pericardium: There is no evidence of pericardial effusion. Mitral Valve: The mitral valve is degenerative in appearance. There is moderate thickening of the anterior mitral valve leaflet(s). There is moderate calcification of the mitral valve leaflet(s). Mild to moderate mitral annular calcification. Mild mitral  valve regurgitation. Mild mitral valve stenosis. MV peak gradient, 12.7 mmHg. The mean mitral valve gradient is 6.0 mmHg. Tricuspid Valve: The tricuspid valve is normal in structure. Tricuspid valve regurgitation is severe. No evidence of tricuspid stenosis. Aortic Valve: The aortic valve has been repaired/replaced. There is moderate calcification of the aortic valve. There is moderate thickening of the aortic valve. Aortic valve regurgitation is trivial. Mild aortic stenosis is present. Aortic valve mean gradient measures 19.0 mmHg. Aortic valve peak gradient measures 41.9 mmHg. Aortic valve area, by VTI measures 1.59 cm. There is a unknown bioprosthetic valve present in the aortic position. Procedure Date: 2012. Pulmonic Valve: The pulmonic valve was not well visualized. Pulmonic valve regurgitation is mild. No evidence of pulmonic stenosis. Aorta: The aortic root/ascending aorta has been repaired/replaced. Venous: The inferior vena cava is dilated in size with less than 50% respiratory variability, suggesting right atrial pressure of 15 mmHg. IAS/Shunts: The atrial septum is grossly normal. Additional Comments: There is a small pleural effusion in the left  lateral region.  LEFT VENTRICLE PLAX 2D LVIDd:         4.70 cm   Diastology LVIDs:         3.10 cm   LV e' medial:    8.92 cm/s LV PW:         1.30 cm   LV E/e' medial:  16.4 LV IVS:        1.30 cm   LV e' lateral:   14.90 cm/s LVOT diam:     2.30 cm   LV E/e' lateral: 9.8 LV SV:         106 LV SV Index:   55 LVOT Area:     4.15 cm  RIGHT VENTRICLE RV Basal diam:  4.20 cm RV Mid diam:    3.60 cm RV S prime:     9.36 cm/s TAPSE (M-mode): 2.2 cm LEFT ATRIUM             Index        RIGHT ATRIUM           Index LA diam:        4.00 cm 2.09 cm/m   RA Area:  38.70 cm LA Vol (A2C):   72.9 ml 38.00 ml/m  RA Volume:   148.00 ml 77.15 ml/m LA Vol (A4C):   63.0 ml 32.84 ml/m LA Biplane Vol: 68.3 ml 35.60 ml/m  AORTIC VALVE                     PULMONIC VALVE AV Area (Vmax):    1.75 cm      PV Vmax:          1.14 m/s AV Area (Vmean):   1.74 cm      PV Peak grad:     5.2 mmHg AV Area (VTI):     1.59 cm      PR End Diast Vel: 9.12 msec AV Vmax:           323.50 cm/s   RVOT Peak grad:   2 mmHg AV Vmean:          201.500 cm/s AV VTI:            0.671 m AV Peak Grad:      41.9 mmHg AV Mean Grad:      19.0 mmHg LVOT Vmax:         136.00 cm/s LVOT Vmean:        84.400 cm/s LVOT VTI:          0.256 m LVOT/AV VTI ratio: 0.38  AORTA Ao Root diam: 3.70 cm Ao Asc diam:  4.20 cm MITRAL VALVE                TRICUSPID VALVE MV Area (PHT): 2.49 cm     TR Peak grad:   49.3 mmHg MV Area VTI:   2.20 cm     TR Vmax:        351.00 cm/s MV Peak grad:  12.7 mmHg MV Mean grad:  6.0 mmHg     SHUNTS MV Vmax:       1.78 m/s     Systemic VTI:  0.26 m MV Vmean:      113.0 cm/s   Systemic Diam: 2.30 cm MV Decel Time: 305 msec MV E velocity: 146.00 cm/s MV A velocity: 113.00 cm/s MV E/A ratio:  1.29 MV A Prime:    9.7 cm/s Jodelle Red MD Electronically signed by Jodelle Red MD Signature Date/Time: 12/15/2022/11:40:21 AM    Final    CT ANGIO HEAD NECK W WO CM W PERF (CODE STROKE) Addendum Date: 12/15/2022 ADDENDUM REPORT:  12/15/2022 07:46 ADDENDUM: Study discussed by telephone with Dr. Jalene Mullet on 12/15/2022 at 0727 hours. Electronically Signed   By: Odessa Fleming M.D.   On: 12/15/2022 07:46   Result Date: 12/15/2022 CLINICAL DATA:  82 year old male code stroke presentation. Reportedly right side deficits. EXAM: CT ANGIOGRAPHY HEAD AND NECK CT PERFUSION BRAIN TECHNIQUE: Multidetector CT imaging of the head and neck was performed using the standard protocol during bolus administration of intravenous contrast. Multiplanar CT image reconstructions and MIPs were obtained to evaluate the vascular anatomy. Carotid stenosis measurements (when applicable) are obtained utilizing NASCET criteria, using the distal internal carotid diameter as the denominator. Multiphase CT imaging of the brain was performed following IV bolus contrast injection. Subsequent parametric perfusion maps were calculated using RAPID software. RADIATION DOSE REDUCTION: This exam was performed according to the departmental dose-optimization program which includes automated exposure control, adjustment of the mA and/or kV according to patient size and/or use of iterative reconstruction technique. CONTRAST:  OMNIPAQUE IOHEXOL 350 MG/ML SOLN COMPARISON:  None  Available. Head CT at the same time reported separately. FINDINGS: CT Brain Perfusion Findings: ASPECTS: 10 CBF (<30%) Volume: 0mL Perfusion (Tmax>6.0s) volume: 0mL No CT Perfusion parameter abnormalities detected. Mismatch Volume: Not applicable Infarction Location:Not applicable CTA NECK Skeleton: Previous sternotomy. Mild for age cervical spine degeneration. No acute osseous abnormality identified. Upper chest: Layering pleural effusions, at least moderate and greater on the left. Additional patchy peribronchial opacity in the left upper lobe is confluent and nonspecific. No superior mediastinal lymphadenopathy. Furthermore, Difficult to exclude a left upper lobe pulmonary embolus on series 7, image 360. No visible  saddle embolus. Other neck: No acute finding. Aortic arch: Calcified aortic atherosclerosis. 3 vessel arch. Right carotid system: Brachiocephalic artery is tortuous. Tortuous right CCA origin but no plaque or stenosis. Calcified plaque at the right ICA origin with less than 50 % stenosis with respect to the distal vessel. Left carotid system: Tortuous left CCA at the level of the thyroid but no CCA plaque or stenosis. Minimal calcified plaque at the left carotid bifurcation without stenosis. Vertebral arteries: Tortuous proximal right subclavian artery with mild calcified plaque at the origin. Right vertebral artery origin is normal. Non dominant right vertebral artery is patent and normal to the skull base. Proximal left subclavian origin minimal plaque and mild tortuosity. Calcified plaque adjacent to the left vertebral artery origin but no significant stenosis on series 7, image 275. Tortuous left V1 segment with additional soft and calcified plaque but no significant stenosis. Dominant left vertebral artery otherwise patent and normal to the skull base. CTA HEAD Posterior circulation: Non dominant right vertebral artery terminates in PICA. Right V4 segment supplies the basilar. There is mild to moderate right V4 circumferential calcified atherosclerosis on series 7, image 144 but no significant stenosis. Patent basilar artery without stenosis. Patent SCA and PCA origins, normal aside from mild basilar tip ectasia. Posterior communicating arteries are diminutive or absent. Left PCA branches are within normal limits. Right PCA branches are within normal limits. Anterior circulation: Both ICA siphons are patent. Left siphon calcified plaque is mild until the supraclinoid segment where moderate calcified plaque and moderate to severe stenosis are noted on series 7, image 109, series 109, image 160. Patent left ICA terminus is mildly ectatic. No discrete terminus aneurysm. Contralateral right ICA siphon moderate  calcified plaque but only mild right siphon stenosis. Patent carotid terminus. Patent MCA and ACA origins. Dominant right ACA A1. Normal anterior communicating artery. Bilateral ACA branches are within normal limits. Right MCA M1 segment and bifurcation are patent without stenosis. Left MCA M1 segment and bifurcation are patent without stenosis. Left MCA branches appear patent and within normal limits. Right MCA branches are within normal limits. Venous sinuses: Patent. Anatomic variants: Dominant left vertebral artery supplies the basilar, diminutive right vertebral terminates in PICA. Dominant right ACA 81. Review of the MIP images confirms the above findings IMPRESSION: 1. Negative CT Perfusion. CTA is negative for large vessel occlusion. 2. Generalized arterial tortuosity. Mildly ectatic basilar artery tip, left ICA terminus. Atherosclerosis is most pronounced at the ICA siphons, and on the left there is moderate to severe supraclinoid Left ICA stenosis due to calcified plaque. No other hemodynamically significant stenosis. 3. Difficult to exclude a left upper lobe pulmonary embolus on series 7, image 360. No visible saddle embolus. But partially visible layering pleural effusions, at least moderate and greater on the left, and with superimposed nonspecific Left upper lobe peribronchial opacity. Consider infection, aspiration, tumor. 4.  Aortic Atherosclerosis (ICD10-I70.0). Electronically Signed: By:  Odessa Fleming M.D. On: 12/15/2022 07:26   CT HEAD CODE STROKE WO CONTRAST Addendum Date: 12/15/2022 ADDENDUM REPORT: 12/15/2022 07:29 ADDENDUM: Study discussed by telephone with Dr. Jalene Mullet in 12/15/2022 at 0710 hours. Electronically Signed   By: Odessa Fleming M.D.   On: 12/15/2022 07:29   Result Date: 12/15/2022 CLINICAL DATA:  Code stroke. 82 year old male last known well at 0400 hours with left gaze, right weakness. EXAM: CT HEAD WITHOUT CONTRAST TECHNIQUE: Contiguous axial images were obtained from the base of the skull  through the vertex without intravenous contrast. RADIATION DOSE REDUCTION: This exam was performed according to the departmental dose-optimization program which includes automated exposure control, adjustment of the mA and/or kV according to patient size and/or use of iterative reconstruction technique. COMPARISON:  Head CT 11/04/2010. FINDINGS: Brain: Cerebral volume is within normal limits for age. No acute intracranial hemorrhage identified. No midline shift, mass effect, or evidence of intracranial mass lesion. No ventriculomegaly. No cortically based acute infarct identified. And gray-white differentiation is frequently normal for age throughout the brain. Mild to moderate right superior frontal lobe white matter hypodensity. Only mild left hemisphere white matter changes. Vascular: Extensive Calcified atherosclerosis at the skull base. Tortuosity. No suspicious intracranial vascular hyperdensity. Skull: Intact.  No acute osseous abnormality identified. Sinuses/Orbits: Visualized paranasal sinuses and mastoids are stable and well aerated. Other: No gaze deviation at this time. Negative visible scalp soft tissues. ASPECTS [facility] Stroke Program Early CT Score) Total score (0-10 with 10 being normal): 10 IMPRESSION: 1. No intracranial hemorrhage and largely normal for age noncontrast CT appearance of the brain. ASPECTS 10. 2. Intracranial artery tortuosity and calcified atherosclerosis. Electronically Signed: By: Odessa Fleming M.D. On: 12/15/2022 07:08   DG Chest 1 View Result Date: 12/13/2022 CLINICAL DATA:  CHF EXAM: CHEST  1 VIEW COMPARISON:  03/20/2022 FINDINGS: Cardiomegaly. Mediastinal contours within normal limits. Prior median sternotomy. Patchy opacity throughout the left lung. Right lung clear. No visible effusions or pneumothorax. No acute bony abnormality. IMPRESSION: Patchy opacity throughout the left lung concerning for pneumonia. Electronically Signed   By: Charlett Nose M.D.   On: 12/13/2022 19:34     Microbiology: Recent Results (from the past 240 hours)  MRSA Next Gen by PCR, Nasal     Status: None   Collection Time: 12/12/22  5:28 PM   Specimen: Nasal Mucosa; Nasal Swab  Result Value Ref Range Status   MRSA by PCR Next Gen NOT DETECTED NOT DETECTED Final    Comment: (NOTE) The GeneXpert MRSA Assay (FDA approved for NASAL specimens only), is one component of a comprehensive MRSA colonization surveillance program. It is not intended to diagnose MRSA infection nor to guide or monitor treatment for MRSA infections. Test performance is not FDA approved in patients less than 104 years old. Performed at [facility], [address] [place], [place] [address]   Culture, blood (Routine X 2) w Reflex to ID Panel     Status: None   Collection Time: 12/13/22  7:42 PM   Specimen: BLOOD  Result Value Ref Range Status   Specimen Description BLOOD BLOOD LEFT ARM  Final   Special Requests   Final    BOTTLES DRAWN AEROBIC AND ANAEROBIC Blood Culture results may not be optimal due to an excessive volume of blood received in culture bottles   Culture   Final    NO GROWTH 5 DAYS Performed at [facility], [address] [place], [place] [address]    Report Status 12/18/2022  FINAL  Final  Culture, blood (Routine X 2) w Reflex to ID Panel     Status: None   Collection Time: 12/13/22  9:35 PM   Specimen: BLOOD  Result Value Ref Range Status   Specimen Description BLOOD BLOOD RIGHT ARM  Final   Special Requests   Final    BOTTLES DRAWN AEROBIC AND ANAEROBIC Blood Culture adequate volume   Culture   Final    NO GROWTH 5 DAYS Performed at [facility], [address] [place], [place] [address]    Report Status 12/18/2022 FINAL  Final     Labs: CBC: Recent Labs  Lab 12/13/22 1949 12/14/22 0149 12/14/22 0435 12/16/22 0445 12/17/22 0507 12/18/22 0612 12/19/22 0501 12/20/22 0405  WBC 7.6 12.8*   < > 6.0 5.6 6.5 5.8 6.0  NEUTROABS 6.0 10.2*  --   --    --   --   --   --   HGB 9.2* 7.3*   < > 7.4* 8.5* 9.2* 8.7* 9.0*  HCT 27.4* 21.3*   < > 22.4* 25.2* 28.7* 27.0* 28.1*  MCV 89.3 87.3   < > 89.6 90.0 92.3 91.8 91.2  PLT 81* 102*   < > 94* 101* 105* 107* 123*   < > = values in this interval not displayed.   Basic Metabolic Panel: Recent Labs  Lab 12/14/22 1007 12/14/22 2044 12/15/22 0341 12/16/22 0445 12/17/22 0507 12/18/22 0612 12/19/22 0501 12/20/22 0405  NA  --   --  141 140 139 139 138 136  K  --   --  4.0 3.8 3.9 3.8 3.8 4.1  CL  --   --  105 101 102 103 104 102  CO2  --   --  26 30 29 25 25 25   GLUCOSE  --   --  153* 118* 87 86 88 83  BUN  --   --  55* 56* 46* 46* 46* 47*  CREATININE  --   --  3.72* 3.72* 3.74* 4.12* 4.08* 4.32*  CALCIUM  --   --  7.2* 6.9* 6.8* 7.1* 7.0* 7.2*  MG 1.6*  --  2.2 2.0 1.9  --  1.9  --   PHOS  --  3.9 4.2 3.7 3.6  --  4.3  --    Liver Function Tests: Recent Labs  Lab 12/13/22 1949  AST 15  ALT 8  ALKPHOS 33*  BILITOT 0.9  PROT 3.0*  ALBUMIN 1.8*   No results for input(s): "LIPASE", "AMYLASE" in the last 168 hours. No results for input(s): "AMMONIA" in the last 168 hours. Cardiac Enzymes: No results for input(s): "CKTOTAL", "CKMB", "CKMBINDEX", "TROPONINI" in the last 168 hours. BNP (last 3 results) Recent Labs    03/20/22 1216  BNP 749.2*   CBG: Recent Labs  Lab 12/15/22 0637 12/15/22 0748 12/15/22 1145 12/15/22 1558 12/15/22 1928  GLUCAP 129* 140* 190* 151* 117*    Time spent: 35 minutes  Signed:  Gillis Santa  [facility] 12/20/2022 11:45 AM

## 2022-12-20 NOTE — TOC Transition Note (Signed)
Transition of Care [facility]) - Discharge Note   Patient Details  Name: Terry KNEECE Sr. MRN: [id number] Date of Birth: 1940-12-17  Transition of Care [facility]) CM/SW Contact:  Truddie Hidden, RN Phone Number: 12/20/2022, 12:14 PM   Clinical Narrative:    Spoke with patient regarding therapy's recommendation for [facility] PT/OT. Patient is agreeable to [facility] PT/OT and does not have a choice of an agency. Patient advised the accepting agency will contact him directly to scheduled SOC within 48 post discharge.   Referral sent and accepted by Artavia from [facility].   Request for RW sent to Jon from [employer]  [facility] signing off.         Patient Goals and CMS Choice            Discharge Placement                       Discharge Plan and Services Additional resources added to the After Visit Summary for                                       Social Drivers of Health (SDOH) Interventions SDOH Screenings   Food Insecurity: No Food Insecurity (12/12/2022)  Housing: Low Risk  (12/18/2022)  Transportation Needs: No Transportation Needs (12/12/2022)  Utilities: Not At Risk (12/12/2022)  Depression (PHQ2-9): Low Risk  (07/09/2022)  Tobacco Use: Medium Risk (12/13/2022)     Readmission Risk Interventions     No data to display

## 2022-12-20 NOTE — Progress Notes (Addendum)
Physical Therapy Treatment Patient Details Name: Terry Stephens. MRN: [id number] DOB: [birth year] Today's Date: 12/20/2022   History of Present Illness Pt is a 82 y.o. male with medical history significant for CKD5, HTN, A-fib on Eliquis (last dose 12/2), CAD s/p CABG, ascending aortic dissection s/p repair 1974, bioprosthetic aortic valve, SVT on high-dose metoprolol succinate, history of syncope with prior loop recorder who presented to [facility] ED on 12/11/22 due to complaints of rectal bleeding that started several hours following Colonoscopy and polypectomy.    PT Comments  Pt was sitting in recliner upon arrival. Supportive spouse present, discussed upcoming DC and equipment needs. Pt is eager to DC home. He is agreeable to RFW but did not want a BSC. Pt was able to safely stand and ambulate ~ 200 ft without LOB or safety concern. HR is irregular with peak at 149 bpm. RR hits 44 but author questions monitoring reliability. Overall pt is progressing well. He will benefit from HHPT at DC to maximize his independence and safety with all ADLs.     If plan is discharge home, recommend the following: A little help with walking and/or transfers;A little help with bathing/dressing/bathroom;Assistance with cooking/housework;Assist for transportation;Help with stairs or ramp for entrance     Equipment Recommendations  Rolling walker (2 wheels)       Precautions / Restrictions Precautions Precautions: Fall Restrictions Weight Bearing Restrictions Per Provider Order: No     Mobility  Bed Mobility Overal bed mobility: Needs Assistance Bed Mobility: Sit to Supine  Sit to supine: Supervision General bed mobility comments: no physical assistance to progress form EOB to supine in bed on a flat bed surface.    Transfers Overall transfer level: Needs assistance Equipment used: Rolling walker (2 wheels) Transfers: Sit to/from Stand Sit to Stand: Supervision   Ambulation/Gait Ambulation/Gait  assistance: Contact guard assist, Supervision Gait Distance (Feet): 200 Feet Assistive device: Rolling walker (2 wheels) Gait Pattern/deviations: Step-through pattern Gait velocity: decreased  General Gait Details: pt demonstrated safe steady gait. HR hits 149bpm at peak with RR hitting 40s. pt endorses no discomfort or fatigue   Stairs Stairs:  (pt did not want to attempt stairs. no stairs at their condo)     Balance Overall balance assessment: Needs assistance Sitting-balance support: Feet supported Sitting balance-Leahy Scale: Good     Standing balance support: During functional activity, Bilateral upper extremity supported, Reliant on assistive device for balance Standing balance-Leahy Scale: Good Standing balance comment: no LOB with use of AD during standing activity       Cognition Arousal: Alert Behavior During Therapy: WFL for tasks assessed/performed Overall Cognitive Status: Within Functional Limits for tasks assessed    General Comments: pt is A and O x 4           General Comments General comments (skin integrity, edema, etc.): discussed equipment needs/ recommendation. pt agreeable to RW but did not want a BSC      Pertinent Vitals/Pain Pain Assessment Pain Assessment: No/denies pain     PT Goals (current goals can now be found in the care plan section) Acute Rehab PT Goals Patient Stated Goal: go home Progress towards PT goals: Progressing toward goals    Frequency    Min 1X/week       AM-PAC PT "6 Clicks" Mobility   Outcome Measure  Help needed turning from your back to your side while in a flat bed without using bedrails?: A Little Help needed moving from lying on your back to  sitting on the side of a flat bed without using bedrails?: A Little Help needed moving to and from a bed to a chair (including a wheelchair)?: A Little Help needed standing up from a chair using your arms (e.g., wheelchair or bedside chair)?: A Little Help needed to  walk in hospital room?: A Little Help needed climbing 3-5 steps with a railing? : A Little 6 Click Score: 18    End of Session   Activity Tolerance: Patient tolerated treatment well Patient left: in bed;with call bell/phone within reach;with family/visitor present Nurse Communication: Mobility status PT Visit Diagnosis: Unsteadiness on feet (R26.81);Muscle weakness (generalized) (M62.81);Difficulty in walking, not elsewhere classified (R26.2)     Time: 8469-6295 PT Time Calculation (min) (ACUTE ONLY): 17 min  Charges:    $Gait Training: 8-22 mins PT General Charges $$ ACUTE PT VISIT: 1 Visit                     Jetta Lout PTA 12/20/22, 12:11 PM

## 2022-12-20 NOTE — Plan of Care (Signed)
  Problem: Education: Goal: Knowledge of General Education information will improve Description: Including pain rating scale, medication(s)/side effects and non-pharmacologic comfort measures Outcome: Progressing   Problem: Clinical Measurements: Goal: Respiratory complications will improve Outcome: Progressing Goal: Cardiovascular complication will be avoided Outcome: Progressing   Problem: Activity: Goal: Risk for activity intolerance will decrease Outcome: Progressing   Problem: Nutrition: Goal: Adequate nutrition will be maintained Outcome: Progressing   Problem: Coping: Goal: Level of anxiety will decrease Outcome: Progressing   Problem: Elimination: Goal: Will not experience complications related to urinary retention Outcome: Progressing   Problem: Pain Management: Goal: General experience of comfort will improve Outcome: Progressing   Problem: Safety: Goal: Ability to remain free from injury will improve Outcome: Progressing   Problem: Skin Integrity: Goal: Risk for impaired skin integrity will decrease Outcome: Progressing

## 2022-12-20 NOTE — Progress Notes (Signed)
Occupational Therapy Treatment Patient Details Name: Terry ROMANOWSKI Sr. MRN: [id number] DOB: January 30, 1940 Today's Date: 12/20/2022   History of present illness Pt is a 82 y.o. male with medical history significant for CKD5, HTN, A-fib on Eliquis (last dose 12/2), CAD s/p CABG, ascending aortic dissection s/p repair 1974, bioprosthetic aortic valve, SVT on high-dose metoprolol succinate, history of syncope with prior loop recorder who presented to [facility] ED on 12/11/22 due to complaints of rectal bleeding that started several hours following Colonoscopy and polypectomy.   OT comments  Pt is supine in bed on arrival. Pleasant and agreeable to OT session. He denies pain. Pt performed bed mobility with SUP and extra time. SBA for STS and taking a few steps to recliner using RW with no LOB noted. Bed soiled with urine and pt noted to have BM. Set up in chair for basin bathing and able to perform with Min A for peri-care/posterior hygiene for thoroughness after BM. Able to stand at [facility] during all hygiene ~3 minutes or so. Pt left seated in recliner with nurse present pulling IV in R arm. All needs in place and will cont to require skilled acute OT services to maximize his safety and IND to return to PLOF.       If plan is discharge home, recommend the following:  Assistance with cooking/housework;Assist for transportation;Help with stairs or ramp for entrance;A little help with bathing/dressing/bathroom;A little help with walking and/or transfers   Equipment Recommendations  None recommended by OT    Recommendations for Other Services      Precautions / Restrictions Restrictions Weight Bearing Restrictions Per Provider Order: No       Mobility Bed Mobility   Bed Mobility: Supine to Sit     Supine to sit: Supervision     General bed mobility comments: SUP, increased time, HOB slightly elevated    Transfers Overall transfer level: Needs assistance Equipment used: Rolling walker (2  wheels) Transfers: Sit to/from Stand Sit to Stand: Supervision           General transfer comment: SBA for STS and to take a few steps and turn to recliner using RW     Balance Overall balance assessment: Needs assistance Sitting-balance support: Feet supported Sitting balance-Leahy Scale: Good Sitting balance - Comments: steady seated balance EOB and in recliner   Standing balance support: During functional activity, Bilateral upper extremity supported, Reliant on assistive device for balance Standing balance-Leahy Scale: Good Standing balance comment: no LOB during posterior hygiene standing at RW                           ADL either performed or assessed with clinical judgement   ADL Overall ADL's : Needs assistance/impaired     Grooming: Wash/dry face;Set up;Sitting   Upper Body Bathing: Set up;Sitting   Lower Body Bathing: Minimal assistance;Sit to/from stand Lower Body Bathing Details (indicate cue type and reason): for thoroughness after [facility]                            Extremity/Trunk Assessment              Vision       Perception     Praxis      Cognition Arousal: Alert Behavior During Therapy: WFL for tasks assessed/performed Overall Cognitive Status: Within Functional Limits for tasks assessed  General Comments: talkative        Exercises      Shoulder Instructions       General Comments IV site to R arm began bleeding with nursing coming to remove during session    Pertinent Vitals/ Pain       Pain Assessment Pain Assessment: No/denies pain Pain Intervention(s): Monitored during session  Home Living                                          Prior Functioning/Environment              Frequency  Min 1X/week        Progress Toward Goals  OT Goals(current goals can now be found in the care plan section)  Progress towards OT goals:  Progressing toward goals  Acute Rehab OT Goals Patient Stated Goal: to return home OT Goal Formulation: With patient/family Time For Goal Achievement: 12/30/22 Potential to Achieve Goals: Good  Plan      Co-evaluation                 AM-PAC OT "6 Clicks" Daily Activity     Outcome Measure   Help from another person eating meals?: None Help from another person taking care of personal grooming?: None Help from another person toileting, which includes using toliet, bedpan, or urinal?: A Little Help from another person bathing (including washing, rinsing, drying)?: A Little Help from another person to put on and taking off regular upper body clothing?: None Help from another person to put on and taking off regular lower body clothing?: A Little 6 Click Score: 21    End of Session Equipment Utilized During Treatment: Rolling walker (2 wheels)  OT Visit Diagnosis: Other abnormalities of gait and mobility (R26.89)   Activity Tolerance Patient tolerated treatment well   Patient Left in chair;with call bell/phone within reach;with family/visitor present;with nursing/sitter in room   Nurse Communication Mobility status        Time: 1000-1040 OT Time Calculation (min): 40 min  Charges: OT General Charges $OT Visit: 1 Visit OT Treatments $Self Care/Home Management : 23-37 mins $Therapeutic Activity: 8-22 mins  Aneka Fagerstrom, OTR/L  12/20/22, 11:11 AM   Elmire Amrein E Libia Fazzini 12/20/2022, 11:07 AM

## 2022-12-20 NOTE — Progress Notes (Addendum)
Right groin triple lumen removed without difficulty; suture sites red but no drainage noted. Hemostasis achieved after 10 min manual pressure applied. Vaseline gauze applied, covered with DGD, and transparent dressing; pt and wife advised to leave dressing in place for 24 hours, and keep covered afterwards if desired; and advised to notify provider if site becomes painful, swollen, or develops any bleeding or drainage. Pt denies pain at site and verbalized understanding of instructions. Wife at bedside and son in room also. Plan for discharge to home today. Pt has ambulated with PT and tolerated well. Waiting for walker prior to DC.

## 2022-12-24 ENCOUNTER — Ambulatory Visit: Payer: Medicare Other

## 2022-12-29 NOTE — Progress Notes (Unsigned)
Cardiology Clinic Note   Date: 12/30/2022 ID: Terry Malkin Sr., DOB 1940/12/23, MRN [id number]  Primary Cardiologist:  Julien Nordmann, MD  Patient Profile    Terry Ano Hokenson Sr. is a 82 y.o. male who presents to the clinic today for hospital follow up.     Past medical history significant for: CAD. LHC 10/31/2010 performed at [facility] health: Two-vessel CAD.  LAD with area of ectasia at D1. CABG x 2 11/06/2010 performed at [facility] health: LIMA to LAD, SVG to RCA. PAF.  DCCV 01/19/2021 performed at [facility]. SVT. Aortic insufficiency. S/p AVR 11/06/2010 performed at [facility]. Chronic diastolic heart failure. Echo 12/14/2022: EF 50 to 55%.  Mild LVH.  Grade II DD.  Hypokinesis of left ventricular, mid-- apical inferior septal wall, inferior wall and apical segment.  Normal RV function.  Moderate RVH.  Severely elevated PA pressure.  Moderate LAE.  Severe RAE.  Mild MR.  Mild mitral stenosis.  Trivial AI.  Mild aortic valve stenosis.  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  IVC dilatation, RA pressure 15 mmHg. Ascending aortic dissection. S/p repair 1974. Hypertension. Hyperlipidemia. End-stage renal disease. Left arm fistula placement. Not on HD.  Former tobacco abuse. Near syncope/syncope.  In summary, patient was first evaluated by Dr. Mariah Milling on 05/29/2022 to establish care with request of Dr. Arlana Pouch.  He was previously followed by cardiology at [facility] with last visit February 2024.  LHC October 2012 showed two-vessel CAD.  Underwent CABG x 2 with AVR for aortic insufficiency on 11/06/2010.  Patient also has a remote history of ascending aortic dissection with repair in 1974.  He underwent left arm AV fistula placement for end-stage renal disease but is not currently on hemodialysis.  He has a history of PAF with successful DCCV January 2023.     History of Present Illness    Terry BOELENS Sr.  is followed by Dr. Mariah Milling for the above outlined history.  Patient was last seen in the office by Dr. Mariah Milling on 12/03/2022 for routine follow-up and preop evaluation prior to colonoscopy.  He was doing well at that time and no changes were made.  Patient underwent hospital admission from 12/5 to 12/13 for rectal bleeding following colonoscopy with polypectomy the day prior to admission.  Patient reported holding Eliquis prior to colonoscopy and had not yet restarted it.  Hospital course complicated by hypotension.  Patient was discharged on 12/20/2022 with instructions to stop losartan and Toprol.  He was started on Lopressor 25 mg twice daily with holding parameters of SBP <120 and HR <65. Of note, patient's hospital course of was complicated by acute onset of unresponsiveness. He was nonverbal, not following commands, not responding to noxious stimulation or moving right upper/lower extremities. Code stroke was called. After transport to ICU from radiology patient had garbled speech and was emotion. Head CT and MRI showed no acute intracranial process.   Today, patient is accompanied by his wife and son. He reports doing okay since hospital discharge. He has not been checking his BP at home. He reports being very "puffy" upon discharge from hospital and is slowly getting back to normal. He still has moderate edema in bilateral feet that seem best in the morning and progresses throughout the day. He denies shortness of breath, orthopnea or PND. No chest pain, pressure or tightness. He was seen by PCP today and had labs drawn.       ROS: All other systems reviewed  and are otherwise negative except as noted in History of Present Illness.  Studies Reviewed    EKG Interpretation Date/Time:  Monday December 30 2022 14:55:58 EST Ventricular Rate:  76 PR Interval:  206 QRS Duration:  160 QT Interval:  466 QTC Calculation: 524 R Axis:   41  Text Interpretation: Normal sinus rhythm with sinus  arrhythmia Right bundle branch block T wave abnormality, consider inferolateral ischemia When compared with ECG of 19-Dec-2022 07:44, No significant change Confirmed by Terry Stephens [phone]) on 12/30/2022 3:04:41 PM   Risk Assessment/Calculations     CHA2DS2-VASc Score = 4   This indicates a 4.8% annual risk of stroke. The patient's score is based upon: CHF History: 0 HTN History: 1 Diabetes History: 0 Stroke History: 0 Vascular Disease History: 1 Age Score: 2 Gender Score: 0             Physical Exam    VS:  BP 130/60 (BP Location: Right Arm, Patient Position: Sitting, Cuff Size: Normal)   Pulse 76   Ht 5\' 9"  (1.753 m)   Wt 164 lb 9.6 oz (74.7 kg)   SpO2 97%   BMI 24.31 kg/m  , BMI Body mass index is 24.31 kg/m.  GEN: Well nourished, well developed, in no acute distress. Neck: No JVD or carotid bruits. Cardiac:  RRR. 2/6 systolic murmur. No rubs or gallops.   Respiratory:  Respirations regular and unlabored. Clear to auscultation without rales, wheezing or rhonchi. GI: Soft, nontender, nondistended. Extremities: Radials/DP/PT 2+ and equal bilaterally. No clubbing or cyanosis. Mild, nonpitting edema bilateral feet.  Skin: Warm and dry, no rash. Neuro: Strength intact.  Assessment & Plan   CAD S/p CABG x 09 October 2010.  Patient denies chest pain, pressure or tightness.  -Continue metoprolol, atorvastatin.  Not on aspirin secondary to Eliquis.  Chronic diastolic heart failure Echo 12/14/2022 showed EF 50 to 55%, mild LVH, Grade II DD, moderate RVH, severely elevated PA pressure, BAE.  Patient reports having a lot of extra fluid after discharge from hospital that is slowly resolving. He reports moderate edema bilateral feet that is best in the AM and progresses throughout the day. He denies shortness of breath, orthopnea or PND. He reports brisk diuresis on Lasix. He takes his second dose at 9pm and gets up frequently throughout the night to urinate. Suggested  changing his second dose to earlier in the evening and he agrees to try it. He has mild, nonpitting edema bilateral feet otherwise euvolemic and well compensated on exam. -Continue Lasix, metoprolol. -Remain off losartan for now.   PAF DCCV January 2023 performed at [facility].  Patient denies palpitations. Denies further spontaneous bleeding concerns since discharge from hospital. -Continue metoprolol and Eliquis. Appropriate Eliquis dose.    Aortic insufficiency s/p AVR October 2012 Echo December 2024 showed trivial AI/mild AS.   Hypotension/GI Bleeding BP today 140/73 on intake and 130/60 on my recheck. No dizziness or headache reported.  -Continue metoprolol with hold parameters of SBP <120 and HR <65. -Continue to hold Losartan.   Disposition: Return in 3 months or sooner as needed.          Signed, Etta Grandchild. Jerni Selmer, DNP, NP-C

## 2022-12-30 ENCOUNTER — Ambulatory Visit: Payer: Medicare Other | Attending: Student | Admitting: Student

## 2022-12-30 ENCOUNTER — Encounter: Payer: Self-pay | Admitting: Student

## 2022-12-30 VITALS — BP 130/60 | HR 76 | Ht 69.0 in | Wt 164.6 lb

## 2022-12-30 DIAGNOSIS — K922 Gastrointestinal hemorrhage, unspecified: Secondary | ICD-10-CM

## 2022-12-30 DIAGNOSIS — Z952 Presence of prosthetic heart valve: Secondary | ICD-10-CM | POA: Diagnosis not present

## 2022-12-30 DIAGNOSIS — I959 Hypotension, unspecified: Secondary | ICD-10-CM

## 2022-12-30 DIAGNOSIS — I5032 Chronic diastolic (congestive) heart failure: Secondary | ICD-10-CM

## 2022-12-30 DIAGNOSIS — I25810 Atherosclerosis of coronary artery bypass graft(s) without angina pectoris: Secondary | ICD-10-CM | POA: Diagnosis not present

## 2022-12-30 DIAGNOSIS — I480 Paroxysmal atrial fibrillation: Secondary | ICD-10-CM | POA: Diagnosis not present

## 2022-12-30 NOTE — Patient Instructions (Addendum)
  Medication Instructions:   None changes  *If you need a refill on your cardiac medications before your next appointment, please call your pharmacy*   Lab Work:   None ordered  Testing/Procedures:  None orderd   Follow-Up: At [facility], you and your health needs are our priority.  As part of our continuing mission to provide you with exceptional heart care, we have created designated Provider Care Teams.  These Care Teams include your primary Cardiologist (physician) and Advanced Practice Providers (APPs -  Physician Assistants and Nurse Practitioners) who all work together to provide you with the care you need, when you need it.  We recommend signing up for the patient portal called "MyChart".  Sign up information is provided on this After Visit Summary.  MyChart is used to connect with patients for Virtual Visits (Telemedicine).  Patients are able to view lab/test results, encounter notes, upcoming appointments, etc.  Non-urgent messages can be sent to your provider as well.   To learn more about what you can do with MyChart, go to [email or web address].    Your next appointment:   3 month(s)  Provider:   You may see Julien Nordmann, MD or one of the following Advanced Practice Providers on your designated Care Team:   Nicolasa Ducking, NP Eula Listen, PA-C Cadence Fransico Michael, PA-C Charlsie Quest, NP Carlos Levering, NP    Other Instructions

## 2022-12-31 ENCOUNTER — Encounter: Payer: Self-pay | Admitting: Student

## 2023-01-16 ENCOUNTER — Other Ambulatory Visit: Payer: Medicare Other

## 2023-01-20 ENCOUNTER — Inpatient Hospital Stay: Payer: Medicare Other | Attending: Oncology

## 2023-01-23 ENCOUNTER — Inpatient Hospital Stay: Payer: Medicare Other | Admitting: Oncology

## 2023-01-23 ENCOUNTER — Inpatient Hospital Stay: Payer: Medicare Other

## 2023-02-13 ENCOUNTER — Other Ambulatory Visit (INDEPENDENT_AMBULATORY_CARE_PROVIDER_SITE_OTHER): Payer: Self-pay | Admitting: Nurse Practitioner

## 2023-02-13 DIAGNOSIS — N186 End stage renal disease: Secondary | ICD-10-CM

## 2023-02-13 NOTE — H&P (View-Only) (Signed)
MRN : [id number]  Terry Collington Ess Sr. is a 83 y.o. (05-Aug-1940) male who presents with chief complaint of check access.  History of Present Illness:   The patient returns to the office for follow up regarding a problem with their dialysis access.   Procedure(s) 02/14/2022 @ [facility]: ARTERIOVENOUS ANASTOMOSIS, OPEN; BY FOREARM VEIN TRANSPOSITION (Left) (brachio-cephalic)   The patient notes a significant increase in problems with dialysis.  It is reported that adequate dialysis is not being achieved.    The patient denies hand pain or other symptoms consistent with steal phenomena.  No significant arm swelling.  The patient denies redness or swelling at the access site. The patient denies fever or chills at home or while on dialysis.  No recent shortening of the patient's walking distance or new symptoms consistent with claudication.  No history of rest pain symptoms. No new ulcers or wounds of the lower extremities have occurred.  The patient denies amaurosis fugax or recent TIA symptoms. There are no recent neurological changes noted. There is no history of DVT, PE or superficial thrombophlebitis. No recent episodes of angina or shortness of breath documented.   Duplex ultrasound of the AV access shows a patent access.  The previously noted stenosis is significantly increased compared to last study.  Flow volume today is 2026 cc/min   No outpatient medications have been marked as taking for the 02/17/23 encounter (Appointment) with Gilda Crease, Latina Craver, MD.    Past Medical History:  Diagnosis Date   A-fib [facility])    Allergic rhinitis    CAD (coronary artery disease)    Chronic kidney disease    Hypertension     Past Surgical History:  Procedure Laterality Date   AORTIC VALVE REPAIR     AV FISTULA PLACEMENT Left 02/2022   BIOPSY  12/11/2022   Procedure: BIOPSY;  Surgeon: Norma Fredrickson, Boykin Nearing, MD;  Location: [facility] ENDOSCOPY;  Service: Gastroenterology;;    COLONOSCOPY N/A 12/13/2022   Procedure: COLONOSCOPY;  Surgeon: Toledo, Boykin Nearing, MD;  Location: [facility] ENDOSCOPY;  Service: Gastroenterology;  Laterality: N/A;   COLONOSCOPY WITH PROPOFOL N/A 12/11/2022   Procedure: COLONOSCOPY WITH PROPOFOL;  Surgeon: Toledo, Boykin Nearing, MD;  Location: [facility] ENDOSCOPY;  Service: Gastroenterology;  Laterality: N/A;   CORONARY ARTERY BYPASS GRAFT     ESOPHAGOGASTRODUODENOSCOPY (EGD) WITH PROPOFOL N/A 12/11/2022   Procedure: ESOPHAGOGASTRODUODENOSCOPY (EGD) WITH PROPOFOL;  Surgeon: Toledo, Boykin Nearing, MD;  Location: [facility] ENDOSCOPY;  Service: Gastroenterology;  Laterality: N/A;   HEMOSTASIS CLIP PLACEMENT  12/11/2022   Procedure: HEMOSTASIS CLIP PLACEMENT;  Surgeon: Norma Fredrickson, Boykin Nearing, MD;  Location: [facility] ENDOSCOPY;  Service: Gastroenterology;;   POLYPECTOMY  12/11/2022   Procedure: POLYPECTOMY;  Surgeon: Toledo, Boykin Nearing, MD;  Location: [facility] ENDOSCOPY;  Service: Gastroenterology;;    Social History Social History   Tobacco Use   Smoking status: Former   Smokeless tobacco: Never  Vaping Use   Vaping status: Never Used  Substance Use Topics   Alcohol use: No   Drug use: No    Family History Family History  Problem Relation Age of Onset   Aneurysm Mother    Heart attack Father    Heart attack Sister    Heart attack Brother     Allergies  Allergen Reactions   Phenothiazines Other (See Comments)    Extra-pyramidal reaction.   Extra-pyramidal reaction   Prochlorperazine Edisylate  REVIEW OF SYSTEMS (Negative unless checked)  Constitutional: [] Weight loss  [] Fever  [] Chills Cardiac: [] Chest pain   [] Chest pressure   [] Palpitations   [] Shortness of breath when laying flat   [] Shortness of breath with exertion. Vascular:  [] Pain in legs with walking   [] Pain in legs at rest  [] History of DVT   [] Phlebitis   [] Swelling in legs   [] Varicose veins   [] Non-healing ulcers Pulmonary:   [] Uses home oxygen   [] Productive cough   [] Hemoptysis   [] Wheeze   [] COPD   [] Asthma Neurologic:  [] Dizziness   [] Seizures   [] History of stroke   [] History of TIA  [] Aphasia   [] Vissual changes   [] Weakness or numbness in arm   [] Weakness or numbness in leg Musculoskeletal:   [] Joint swelling   [] Joint pain   [] Low back pain Hematologic:  [] Easy bruising  [] Easy bleeding   [] Hypercoagulable state   [] Anemic Gastrointestinal:  [] Diarrhea   [] Vomiting  [] Gastroesophageal reflux/heartburn   [] Difficulty swallowing. Genitourinary:  [x] Chronic kidney disease   [] Difficult urination  [] Frequent urination   [] Blood in urine Skin:  [] Rashes   [] Ulcers  Psychological:  [] History of anxiety   []  History of major depression.  Physical Examination  There were no vitals filed for this visit. There is no height or weight on file to calculate BMI. Gen: WD/WN, NAD Head: [place]/AT, No temporalis wasting.  Ear/Nose/Throat: Hearing grossly intact, nares w/o erythema or drainage Eyes: PER, EOMI, sclera nonicteric.  Neck: Supple, no gross masses or lesions.  No JVD.  Pulmonary:  Good air movement, no audible wheezing, no use of accessory muscles.  Cardiac: RRR, precordium non-hyperdynamic. Vascular:   Left brachiocephalic AV fistula is moderately to severely pulsatile there is ecchymoses surrounding it from a recent infiltration there is a staccato thrill Vessel Right Left  Radial Palpable Palpable  Brachial Palpable Palpable  Gastrointestinal: soft, non-distended. No guarding/no peritoneal signs.  Musculoskeletal: M/S 5/5 throughout.  No deformity.  Neurologic: CN 2-12 intact. Pain and light touch intact in extremities.  Symmetrical.  Speech is fluent. Motor exam as listed above. Psychiatric: Judgment intact, Mood & affect appropriate for pt's clinical situation. Dermatologic: No rashes or ulcers noted.  No changes consistent with cellulitis.   CBC Lab Results  Component Value Date   WBC 6.0 12/20/2022   HGB 9.0 (L) 12/20/2022   HCT 28.1 (L) 12/20/2022   MCV 91.2  12/20/2022   PLT 123 (L) 12/20/2022    BMET    Component Value Date/Time   NA 136 12/20/2022 0405   K 4.1 12/20/2022 0405   CL 102 12/20/2022 0405   CO2 25 12/20/2022 0405   GLUCOSE 83 12/20/2022 0405   BUN 47 (H) 12/20/2022 0405   CREATININE 4.32 (H) 12/20/2022 0405   CALCIUM 7.2 (L) 12/20/2022 0405   GFRNONAA 13 (L) 12/20/2022 0405   GFRAA (L) 07/01/2006 1132    51        The eGFR has been calculated using the MDRD equation. This calculation has not been validated in all clinical   CrCl cannot be calculated (Patient's most recent lab result is older than the maximum 21 days allowed.).  COAG Lab Results  Component Value Date   INR 1.2 12/17/2022   INR 1.4 (H) 12/14/2022   INR 1.3 (H) 12/12/2022    Radiology No results found.   Assessment/Plan 1. CKD (chronic kidney disease) stage 5, GFR less than 15 ml/min (HCC) (Primary) Recommend:  The patient has progressed and is  now stage V on hemodialysis.  The patient is experiencing increasing problems with their dialysis access.  In fact he missed this past Saturday because of an infiltration  Patient should have a fistulagram of the left brachiocephalic fistula with the intention for intervention.  The intention for intervention is to restore appropriate flow and prevent thrombosis and possible loss of the access.  As well as improve the quality of dialysis therapy.  The risks, benefits and alternative therapies were reviewed in detail with the patient.  All questions were answered.  The patient agrees to proceed with angio/intervention.    The patient will follow up with me in the office after the procedure.   2. Coronary artery disease involving native coronary artery of native heart with angina pectoris (HCC) Continue cardiac and antihypertensive medications as already ordered and reviewed, no changes at this time.  Continue statin as ordered and reviewed, no changes at this time  Nitrates PRN for chest pain  3.  Atrial fibrillation, unspecified type (HCC) Continue antiarrhythmia medications as already ordered, these medications have been reviewed and there are no changes at this time.  Continue anticoagulation as ordered by Cardiology Service  4. Primary hypertension Continue antihypertensive medications as already ordered, these medications have been reviewed and there are no changes at this time.    Levora Dredge, MD  02/13/2023 1:06 PM

## 2023-02-13 NOTE — Progress Notes (Signed)
MRN : [id number]  Terry Collington Ess Sr. is a 83 y.o. (05-Aug-1940) male who presents with chief complaint of check access.  History of Present Illness:   The patient returns to the office for follow up regarding a problem with their dialysis access.   Procedure(s) 02/14/2022 @ [facility]: ARTERIOVENOUS ANASTOMOSIS, OPEN; BY FOREARM VEIN TRANSPOSITION (Left) (brachio-cephalic)   The patient notes a significant increase in problems with dialysis.  It is reported that adequate dialysis is not being achieved.    The patient denies hand pain or other symptoms consistent with steal phenomena.  No significant arm swelling.  The patient denies redness or swelling at the access site. The patient denies fever or chills at home or while on dialysis.  No recent shortening of the patient's walking distance or new symptoms consistent with claudication.  No history of rest pain symptoms. No new ulcers or wounds of the lower extremities have occurred.  The patient denies amaurosis fugax or recent TIA symptoms. There are no recent neurological changes noted. There is no history of DVT, PE or superficial thrombophlebitis. No recent episodes of angina or shortness of breath documented.   Duplex ultrasound of the AV access shows a patent access.  The previously noted stenosis is significantly increased compared to last study.  Flow volume today is 2026 cc/min   No outpatient medications have been marked as taking for the 02/17/23 encounter (Appointment) with Gilda Crease, Latina Craver, MD.    Past Medical History:  Diagnosis Date   A-fib [facility])    Allergic rhinitis    CAD (coronary artery disease)    Chronic kidney disease    Hypertension     Past Surgical History:  Procedure Laterality Date   AORTIC VALVE REPAIR     AV FISTULA PLACEMENT Left 02/2022   BIOPSY  12/11/2022   Procedure: BIOPSY;  Surgeon: Norma Fredrickson, Boykin Nearing, MD;  Location: [facility] ENDOSCOPY;  Service: Gastroenterology;;    COLONOSCOPY N/A 12/13/2022   Procedure: COLONOSCOPY;  Surgeon: Toledo, Boykin Nearing, MD;  Location: [facility] ENDOSCOPY;  Service: Gastroenterology;  Laterality: N/A;   COLONOSCOPY WITH PROPOFOL N/A 12/11/2022   Procedure: COLONOSCOPY WITH PROPOFOL;  Surgeon: Toledo, Boykin Nearing, MD;  Location: [facility] ENDOSCOPY;  Service: Gastroenterology;  Laterality: N/A;   CORONARY ARTERY BYPASS GRAFT     ESOPHAGOGASTRODUODENOSCOPY (EGD) WITH PROPOFOL N/A 12/11/2022   Procedure: ESOPHAGOGASTRODUODENOSCOPY (EGD) WITH PROPOFOL;  Surgeon: Toledo, Boykin Nearing, MD;  Location: [facility] ENDOSCOPY;  Service: Gastroenterology;  Laterality: N/A;   HEMOSTASIS CLIP PLACEMENT  12/11/2022   Procedure: HEMOSTASIS CLIP PLACEMENT;  Surgeon: Norma Fredrickson, Boykin Nearing, MD;  Location: [facility] ENDOSCOPY;  Service: Gastroenterology;;   POLYPECTOMY  12/11/2022   Procedure: POLYPECTOMY;  Surgeon: Toledo, Boykin Nearing, MD;  Location: [facility] ENDOSCOPY;  Service: Gastroenterology;;    Social History Social History   Tobacco Use   Smoking status: Former   Smokeless tobacco: Never  Vaping Use   Vaping status: Never Used  Substance Use Topics   Alcohol use: No   Drug use: No    Family History Family History  Problem Relation Age of Onset   Aneurysm Mother    Heart attack Father    Heart attack Sister    Heart attack Brother     Allergies  Allergen Reactions   Phenothiazines Other (See Comments)    Extra-pyramidal reaction.   Extra-pyramidal reaction   Prochlorperazine Edisylate  REVIEW OF SYSTEMS (Negative unless checked)  Constitutional: [] Weight loss  [] Fever  [] Chills Cardiac: [] Chest pain   [] Chest pressure   [] Palpitations   [] Shortness of breath when laying flat   [] Shortness of breath with exertion. Vascular:  [] Pain in legs with walking   [] Pain in legs at rest  [] History of DVT   [] Phlebitis   [] Swelling in legs   [] Varicose veins   [] Non-healing ulcers Pulmonary:   [] Uses home oxygen   [] Productive cough   [] Hemoptysis   [] Wheeze   [] COPD   [] Asthma Neurologic:  [] Dizziness   [] Seizures   [] History of stroke   [] History of TIA  [] Aphasia   [] Vissual changes   [] Weakness or numbness in arm   [] Weakness or numbness in leg Musculoskeletal:   [] Joint swelling   [] Joint pain   [] Low back pain Hematologic:  [] Easy bruising  [] Easy bleeding   [] Hypercoagulable state   [] Anemic Gastrointestinal:  [] Diarrhea   [] Vomiting  [] Gastroesophageal reflux/heartburn   [] Difficulty swallowing. Genitourinary:  [x] Chronic kidney disease   [] Difficult urination  [] Frequent urination   [] Blood in urine Skin:  [] Rashes   [] Ulcers  Psychological:  [] History of anxiety   []  History of major depression.  Physical Examination  There were no vitals filed for this visit. There is no height or weight on file to calculate BMI. Gen: WD/WN, NAD Head: [place]/AT, No temporalis wasting.  Ear/Nose/Throat: Hearing grossly intact, nares w/o erythema or drainage Eyes: PER, EOMI, sclera nonicteric.  Neck: Supple, no gross masses or lesions.  No JVD.  Pulmonary:  Good air movement, no audible wheezing, no use of accessory muscles.  Cardiac: RRR, precordium non-hyperdynamic. Vascular:   Left brachiocephalic AV fistula is moderately to severely pulsatile there is ecchymoses surrounding it from a recent infiltration there is a staccato thrill Vessel Right Left  Radial Palpable Palpable  Brachial Palpable Palpable  Gastrointestinal: soft, non-distended. No guarding/no peritoneal signs.  Musculoskeletal: M/S 5/5 throughout.  No deformity.  Neurologic: CN 2-12 intact. Pain and light touch intact in extremities.  Symmetrical.  Speech is fluent. Motor exam as listed above. Psychiatric: Judgment intact, Mood & affect appropriate for pt's clinical situation. Dermatologic: No rashes or ulcers noted.  No changes consistent with cellulitis.   CBC Lab Results  Component Value Date   WBC 6.0 12/20/2022   HGB 9.0 (L) 12/20/2022   HCT 28.1 (L) 12/20/2022   MCV 91.2  12/20/2022   PLT 123 (L) 12/20/2022    BMET    Component Value Date/Time   NA 136 12/20/2022 0405   K 4.1 12/20/2022 0405   CL 102 12/20/2022 0405   CO2 25 12/20/2022 0405   GLUCOSE 83 12/20/2022 0405   BUN 47 (H) 12/20/2022 0405   CREATININE 4.32 (H) 12/20/2022 0405   CALCIUM 7.2 (L) 12/20/2022 0405   GFRNONAA 13 (L) 12/20/2022 0405   GFRAA (L) 07/01/2006 1132    51        The eGFR has been calculated using the MDRD equation. This calculation has not been validated in all clinical   CrCl cannot be calculated (Patient's most recent lab result is older than the maximum 21 days allowed.).  COAG Lab Results  Component Value Date   INR 1.2 12/17/2022   INR 1.4 (H) 12/14/2022   INR 1.3 (H) 12/12/2022    Radiology No results found.   Assessment/Plan 1. CKD (chronic kidney disease) stage 5, GFR less than 15 ml/min (HCC) (Primary) Recommend:  The patient has progressed and is  now stage V on hemodialysis.  The patient is experiencing increasing problems with their dialysis access.  In fact he missed this past Saturday because of an infiltration  Patient should have a fistulagram of the left brachiocephalic fistula with the intention for intervention.  The intention for intervention is to restore appropriate flow and prevent thrombosis and possible loss of the access.  As well as improve the quality of dialysis therapy.  The risks, benefits and alternative therapies were reviewed in detail with the patient.  All questions were answered.  The patient agrees to proceed with angio/intervention.    The patient will follow up with me in the office after the procedure.   2. Coronary artery disease involving native coronary artery of native heart with angina pectoris (HCC) Continue cardiac and antihypertensive medications as already ordered and reviewed, no changes at this time.  Continue statin as ordered and reviewed, no changes at this time  Nitrates PRN for chest pain  3.  Atrial fibrillation, unspecified type (HCC) Continue antiarrhythmia medications as already ordered, these medications have been reviewed and there are no changes at this time.  Continue anticoagulation as ordered by Cardiology Service  4. Primary hypertension Continue antihypertensive medications as already ordered, these medications have been reviewed and there are no changes at this time.    Levora Dredge, MD  02/13/2023 1:06 PM

## 2023-02-17 ENCOUNTER — Encounter (INDEPENDENT_AMBULATORY_CARE_PROVIDER_SITE_OTHER): Payer: Self-pay | Admitting: Vascular Surgery

## 2023-02-17 ENCOUNTER — Ambulatory Visit (INDEPENDENT_AMBULATORY_CARE_PROVIDER_SITE_OTHER): Payer: Medicare Other | Admitting: Vascular Surgery

## 2023-02-17 ENCOUNTER — Ambulatory Visit (INDEPENDENT_AMBULATORY_CARE_PROVIDER_SITE_OTHER): Payer: Medicare Other

## 2023-02-17 VITALS — BP 123/55 | HR 83 | Resp 15 | Wt 161.6 lb

## 2023-02-17 DIAGNOSIS — I4891 Unspecified atrial fibrillation: Secondary | ICD-10-CM

## 2023-02-17 DIAGNOSIS — I25119 Atherosclerotic heart disease of native coronary artery with unspecified angina pectoris: Secondary | ICD-10-CM | POA: Diagnosis not present

## 2023-02-17 DIAGNOSIS — N186 End stage renal disease: Secondary | ICD-10-CM | POA: Diagnosis not present

## 2023-02-17 DIAGNOSIS — N185 Chronic kidney disease, stage 5: Secondary | ICD-10-CM

## 2023-02-17 DIAGNOSIS — I10 Essential (primary) hypertension: Secondary | ICD-10-CM | POA: Diagnosis not present

## 2023-02-18 ENCOUNTER — Encounter (INDEPENDENT_AMBULATORY_CARE_PROVIDER_SITE_OTHER): Payer: Self-pay | Admitting: Vascular Surgery

## 2023-02-18 ENCOUNTER — Telehealth (INDEPENDENT_AMBULATORY_CARE_PROVIDER_SITE_OTHER): Payer: Self-pay

## 2023-02-18 NOTE — Telephone Encounter (Signed)
Spoke with the patient and he is scheduled with Dr. Gilda Crease for a left arm fistulagram on 02/21/23 with a 9:00 am arrival time to the [facility]. Pre-procedure instructions were discussed and patient stated he wrote them down.

## 2023-02-21 ENCOUNTER — Other Ambulatory Visit (INDEPENDENT_AMBULATORY_CARE_PROVIDER_SITE_OTHER): Payer: Self-pay | Admitting: Nurse Practitioner

## 2023-02-21 ENCOUNTER — Ambulatory Visit
Admission: RE | Admit: 2023-02-21 | Discharge: 2023-02-21 | Disposition: A | Discharge status: Home / Self Care | Payer: Medicare Other | Source: Ambulatory Visit | Attending: Vascular Surgery | Admitting: Vascular Surgery

## 2023-02-21 ENCOUNTER — Encounter: Payer: Self-pay | Admitting: Vascular Surgery

## 2023-02-21 ENCOUNTER — Encounter
Admission: RE | Disposition: A | Discharge status: Home / Self Care | Payer: Self-pay | Source: Ambulatory Visit | Attending: Vascular Surgery

## 2023-02-21 ENCOUNTER — Other Ambulatory Visit: Payer: Self-pay

## 2023-02-21 DIAGNOSIS — Z7901 Long term (current) use of anticoagulants: Secondary | ICD-10-CM | POA: Diagnosis not present

## 2023-02-21 DIAGNOSIS — N186 End stage renal disease: Secondary | ICD-10-CM | POA: Insufficient documentation

## 2023-02-21 DIAGNOSIS — I2510 Atherosclerotic heart disease of native coronary artery without angina pectoris: Secondary | ICD-10-CM | POA: Diagnosis not present

## 2023-02-21 DIAGNOSIS — Z87891 Personal history of nicotine dependence: Secondary | ICD-10-CM | POA: Insufficient documentation

## 2023-02-21 DIAGNOSIS — I120 Hypertensive chronic kidney disease with stage 5 chronic kidney disease or end stage renal disease: Secondary | ICD-10-CM | POA: Diagnosis not present

## 2023-02-21 DIAGNOSIS — Z992 Dependence on renal dialysis: Secondary | ICD-10-CM | POA: Diagnosis not present

## 2023-02-21 DIAGNOSIS — Z8249 Family history of ischemic heart disease and other diseases of the circulatory system: Secondary | ICD-10-CM | POA: Diagnosis not present

## 2023-02-21 DIAGNOSIS — Y832 Surgical operation with anastomosis, bypass or graft as the cause of abnormal reaction of the patient, or of later complication, without mention of misadventure at the time of the procedure: Secondary | ICD-10-CM | POA: Diagnosis not present

## 2023-02-21 DIAGNOSIS — I4891 Unspecified atrial fibrillation: Secondary | ICD-10-CM | POA: Insufficient documentation

## 2023-02-21 DIAGNOSIS — T82858A Stenosis of vascular prosthetic devices, implants and grafts, initial encounter: Secondary | ICD-10-CM | POA: Insufficient documentation

## 2023-02-21 HISTORY — PX: A/V FISTULAGRAM: CATH118298

## 2023-02-21 LAB — POTASSIUM (ARMC VASCULAR LAB ONLY): Potassium (ARMC vascular lab): 3.4 mmol/L — ABNORMAL LOW (ref 3.5–5.1)

## 2023-02-21 SURGERY — A/V FISTULAGRAM
Anesthesia: Moderate Sedation | Laterality: Left

## 2023-02-21 MED ORDER — HYDROMORPHONE HCL 1 MG/ML IJ SOLN: 1.0000 mg | Freq: Once | INTRAMUSCULAR | Status: DC | PRN

## 2023-02-21 MED ORDER — METHYLPREDNISOLONE SODIUM SUCC 125 MG IJ SOLR: 125.0000 mg | Freq: Once | INTRAMUSCULAR | Status: DC | PRN

## 2023-02-21 MED ORDER — ONDANSETRON HCL 4 MG/2ML IJ SOLN
4.0000 mg | Freq: Four times a day (QID) | INTRAMUSCULAR | Status: DC | PRN
Start: 2023-02-21 — End: 2023-02-21

## 2023-02-21 MED ORDER — DIPHENHYDRAMINE HCL 50 MG/ML IJ SOLN
50.0000 mg | Freq: Once | INTRAMUSCULAR | Status: DC | PRN
Start: 2023-02-21 — End: 2023-02-21

## 2023-02-21 MED ORDER — HEPARIN SODIUM (PORCINE) 1000 UNIT/ML IJ SOLN
INTRAMUSCULAR | Status: AC
  Filled 2023-02-21: qty 10

## 2023-02-21 MED ORDER — IODIXANOL 320 MG/ML IV SOLN
INTRAVENOUS | Status: DC | PRN
  Administered 2023-02-21: 35 mL via INTRA_ARTERIAL

## 2023-02-21 MED ORDER — FENTANYL CITRATE PF 50 MCG/ML IJ SOSY
PREFILLED_SYRINGE | INTRAMUSCULAR | Status: AC
  Filled 2023-02-21: qty 1

## 2023-02-21 MED ORDER — MIDAZOLAM HCL 2 MG/ML PO SYRP: 8.0000 mg | ORAL_SOLUTION | Freq: Once | ORAL | Status: DC | PRN

## 2023-02-21 MED ORDER — LIDOCAINE HCL (PF) 1 % IJ SOLN
INTRAMUSCULAR | Status: DC | PRN
  Administered 2023-02-21: 10 mL

## 2023-02-21 MED ORDER — MIDAZOLAM HCL 2 MG/2ML IJ SOLN
INTRAMUSCULAR | Status: DC | PRN
  Administered 2023-02-21 (×2): 1 mg via INTRAVENOUS

## 2023-02-21 MED ORDER — FAMOTIDINE 20 MG PO TABS: 40.0000 mg | ORAL_TABLET | Freq: Once | ORAL | Status: DC | PRN

## 2023-02-21 MED ORDER — HEPARIN SODIUM (PORCINE) 1000 UNIT/ML IJ SOLN
INTRAMUSCULAR | Status: DC | PRN
  Administered 2023-02-21: 4000 [IU] via INTRAVENOUS

## 2023-02-21 MED ORDER — CEFAZOLIN SODIUM-DEXTROSE 1-4 GM/50ML-% IV SOLN
1.0000 g | INTRAVENOUS | Status: AC
  Administered 2023-02-21: 1 g via INTRAVENOUS

## 2023-02-21 MED ORDER — MIDAZOLAM HCL 2 MG/2ML IJ SOLN
INTRAMUSCULAR | Status: AC
  Filled 2023-02-21: qty 2

## 2023-02-21 MED ORDER — HEPARIN (PORCINE) IN NACL 1000-0.9 UT/500ML-% IV SOLN
INTRAVENOUS | Status: DC | PRN
  Administered 2023-02-21: 500 mL

## 2023-02-21 MED ORDER — SODIUM CHLORIDE 0.9 % IV SOLN: INTRAVENOUS | Status: DC

## 2023-02-21 MED ORDER — FENTANYL CITRATE (PF) 100 MCG/2ML IJ SOLN
INTRAMUSCULAR | Status: DC | PRN
  Administered 2023-02-21: 50 ug via INTRAVENOUS

## 2023-02-21 SURGICAL SUPPLY — 20 items
BALLN DORADO 8X60X80 (BALLOONS) ×1
BALLN DORADO 9X40X80 (BALLOONS) ×1
BALLN LUTONIX DCB 6X60X130 (BALLOONS) ×1
BALLOON DORADO 8X60X80 (BALLOONS) IMPLANT
BALLOON DORADO 9X40X80 (BALLOONS) IMPLANT
BALLOON LUTONIX DCB 6X60X130 (BALLOONS) IMPLANT
CATH BEACON 5 .035 65 KMP TIP (CATHETERS) IMPLANT
COVER PROBE ULTRASOUND 5X96 (MISCELLANEOUS) IMPLANT
DEVICE PRESTO INFLATION (MISCELLANEOUS) IMPLANT
DRAPE BRACHIAL (DRAPES) IMPLANT
GOWN STRL REUS W/ TWL LRG LVL3 (GOWN DISPOSABLE) ×2 IMPLANT
NDL ENTRY 21GA 7CM ECHOTIP (NEEDLE) IMPLANT
NEEDLE ENTRY 21GA 7CM ECHOTIP (NEEDLE) ×1
PACK ANGIOGRAPHY (CUSTOM PROCEDURE TRAY) ×2 IMPLANT
SET INTRO CAPELLA COAXIAL (SET/KITS/TRAYS/PACK) IMPLANT
SHEATH BRITE TIP 6FRX5.5 (SHEATH) IMPLANT
SHEATH BRITE TIP 7FRX5.5 (SHEATH) IMPLANT
STENT VIABAHN 8X7.5X120 (Permanent Stent) IMPLANT
SUT MNCRL AB 4-0 PS2 18 (SUTURE) IMPLANT
WIRE G 018X200 V18 (WIRE) IMPLANT

## 2023-02-21 NOTE — Interval H&P Note (Signed)
History and Physical Interval Note:  02/21/2023 10:03 AM  Terry Ano Takacs Sr.  has presented today for surgery, with the diagnosis of L Arm Fistulagram   End Stage Renal.  The various methods of treatment have been discussed with the patient and family. After consideration of risks, benefits and other options for treatment, the patient has consented to  Procedure(s): A/V Fistulagram (Left) as a surgical intervention.  The patient's history has been reviewed, patient examined, no change in status, stable for surgery.  I have reviewed the patient's chart and labs.  Questions were answered to the patient's satisfaction.     Levora Dredge

## 2023-02-21 NOTE — OR Nursing (Signed)
Post procedure he had 3 runs of atrial fib with rapid response rate 149 and spontaneously reducing to 80s frequent pvc's. Dr Mariah Milling notiifed. He instruced me to tell patient to take his metoprolol 25 mg bid. Pt reports his medication bottle had 25 mg twice a day but he has been holding it due to BP. Dr Mariah Milling notified. He will schedule follow up in office. Rhythm strip scanned into chart. His dialysis starts around 11 am. Dialysis has instructed pt to hold metoprolol if sbp less than 140.

## 2023-02-21 NOTE — Discharge Instructions (Signed)
Shuntogram, Care After Refer to this sheet in the next few weeks. These instructions provide you with information on caring for yourself after your procedure. Your health care provider may also give you more specific instructions. Your treatment has been planned according to current medical practices, but problems sometimes occur. Call your health care provider if you have any problems or questions after your procedure. What can I expect after the procedure? After your procedure, it is typical to have the following:  A small amount of discomfort in the area where the catheters were placed.  A small amount of bruising around the fistula.  Sleepiness and fatigue.  Follow these instructions at home:  Rest at home for the day following your procedure.  Do not drive or operate heavy machinery while taking pain medicine.  Take medicines only as directed by your health care provider.  Do not take baths, swim, or use a hot tub until your health care provider approves. You may shower 24 hours after the procedure or as directed by your health care provider.  There are many different ways to close and cover an incision, including stitches, skin glue, and adhesive strips. Follow your health care provider's instructions on: ? Incision care. ? Bandage (dressing) changes and removal. ? Incision closure removal.  Monitor your dialysis fistula carefully. Contact a health care provider if:  You have drainage, redness, swelling, or pain at your catheter site.  You have a fever.  You have chills. Get help right away if:  You feel weak.  You have trouble balancing.  You have trouble moving your arms or legs.  You have problems with your speech or vision.  You can no longer feel a vibration or buzz when you put your fingers over your dialysis fistula.  The limb that was used for the procedure: ? Swells. ? Is painful. ? Is cold. ? Is discolored, such as blue or pale white. This  information is not intended to replace advice given to you by your health care provider. Make sure you discuss any questions you have with your health care provider. Document Released: 05/10/2013 Document Revised: 06/01/2015 Document Reviewed: 02/12/2013 Elsevier Interactive Patient Education  2018 [employer].

## 2023-02-21 NOTE — Op Note (Signed)
OPERATIVE NOTE   PROCEDURE: Contrast injection left brachiocephalic AV access Percutaneous transluminal angioplasty and stent placement cephalic vein at the cephalic subclavian confluence, peripheral segment  PRE-OPERATIVE DIAGNOSIS: Complication of dialysis access                                                       End Stage Renal Disease  POST-OPERATIVE DIAGNOSIS: same as above   SURGEON: Renford Dills, M.D.  ANESTHESIA: Conscious sedation was administered under my direct supervision by the interventional radiology RN. IV Versed plus fentanyl were utilized. Continuous ECG, pulse oximetry and blood pressure was monitored throughout the entire procedure.  Conscious sedation was for a total of 32.  ESTIMATED BLOOD LOSS: minimal  FINDING(S): Greater than 80% narrowing of the cephalic subclavian confluence within the cephalic vein  SPECIMEN(S):  None  CONTRAST: 35 cc  FLUOROSCOPY TIME: 4.2 minutes  INDICATIONS: Terry Malkin Sr. is a 83 y.o. male who  presents with malfunctioning left arm brachiocephalic AV access.  The patient is scheduled for angiography with possible intervention of the AV access.  The patient is aware the risks include but are not limited to: bleeding, infection, thrombosis of the cannulated access, and possible anaphylactic reaction to the contrast.  The patient acknowledges if the access can not be salvaged a tunneled catheter will be needed and will be placed during this procedure.  The patient is aware of the risks of the procedure and elects to proceed with the angiogram and intervention.  DESCRIPTION: After full informed written consent was obtained, the patient was brought back to the Special Procedure suite and placed supine position.  Appropriate cardiopulmonary monitors were placed.  The left arm was prepped and draped in the standard fashion.  Appropriate timeout is called. The left brachiocephalic fistula was cannulated with a micropuncture  needle.  Cannulation was performed with ultrasound guidance. Ultrasound was placed in a sterile sleeve, the AV access was interrogated and noted to be echolucent and compressible indicating patency. Image was recorded for the permanent record. The puncture is performed under continuous ultrasound visualization.   The microwire was advanced and the needle was exchanged for  a microsheath.  The J-wire was then advanced and a 6 Fr sheath inserted.  Hand injections were completed to image the access from the arterial anastomosis through the entire access.  The central venous structures were also imaged by hand injections.  Interpretation the fistula itself is widely patent and measures approximately 12 mm in diameter.  There are no abnormalities from the anastomosis up to the level of the clavicle.  At the cephalic subclavian confluence there is a greater than 80% stenosis over 4 to 5 cm in length.  Central veins are widely patent.  Arterial anastomosis is widely patent as is the visualized portions of the brachial artery.  Based on the images,  4000 units of heparin was given and a wire was negotiated through the strictures within the venous portion of the graft.  The lesion was then predilated with a 6 mm balloon inflated to 12 atm for approximately 1 minute.  An 8 mm x 75 mm Viabahn was deployed across the stenoses and postdilated with an 8 mm Dorado balloon.  The stent remained undersized and a 9 mm x 40 mm Dorado balloon was then used to treat.  2  inflations were performed each to 16 atm for approximately 1 minute.  Follow-up imaging demonstrates complete resolution of the stricture, less than 5% residual stenosis, with rapid flow of contrast through the graft, the central venous anatomy is preserved.  A 4-0 Monocryl purse-string suture was sewn around the sheath.  The sheath was removed and light pressure was applied.  A sterile bandage was applied to the puncture site.    COMPLICATIONS:  None  CONDITION: Almon Register, M.D [facility] Office: [phone]  02/21/2023 2:08 PM

## 2023-02-24 ENCOUNTER — Encounter: Payer: Self-pay | Admitting: Vascular Surgery

## 2023-02-28 ENCOUNTER — Ambulatory Visit: Payer: Medicare Other | Attending: Cardiovascular Disease | Admitting: Cardiovascular Disease

## 2023-02-28 ENCOUNTER — Encounter: Payer: Self-pay | Admitting: Cardiovascular Disease

## 2023-02-28 VITALS — BP 124/54 | HR 83 | Ht 69.0 in | Wt 162.2 lb

## 2023-02-28 DIAGNOSIS — I25118 Atherosclerotic heart disease of native coronary artery with other forms of angina pectoris: Secondary | ICD-10-CM

## 2023-02-28 DIAGNOSIS — I5032 Chronic diastolic (congestive) heart failure: Secondary | ICD-10-CM | POA: Diagnosis not present

## 2023-02-28 DIAGNOSIS — I491 Atrial premature depolarization: Secondary | ICD-10-CM

## 2023-02-28 DIAGNOSIS — I25810 Atherosclerosis of coronary artery bypass graft(s) without angina pectoris: Secondary | ICD-10-CM

## 2023-02-28 DIAGNOSIS — I959 Hypotension, unspecified: Secondary | ICD-10-CM

## 2023-02-28 DIAGNOSIS — I480 Paroxysmal atrial fibrillation: Secondary | ICD-10-CM | POA: Diagnosis not present

## 2023-02-28 DIAGNOSIS — I471 Supraventricular tachycardia, unspecified: Secondary | ICD-10-CM

## 2023-02-28 DIAGNOSIS — N186 End stage renal disease: Secondary | ICD-10-CM

## 2023-02-28 MED ORDER — MIDODRINE HCL 10 MG PO TABS: 10.0000 mg | ORAL_TABLET | Freq: Three times a day (TID) | ORAL | 3 refills | Status: DC | PRN

## 2023-02-28 MED ORDER — METOPROLOL TARTRATE 25 MG PO TABS: 25.0000 mg | ORAL_TABLET | Freq: Two times a day (BID) | ORAL | 3 refills | Status: DC

## 2023-02-28 NOTE — Progress Notes (Signed)
Cardiology Office Note  Date:  02/28/2023   ID:  Terry ALPERN Sr., DOB 12-15-1940, MRN [id number]  PCP:  Jaclyn Shaggy, MD   Chief Complaint  Patient presents with   Follow up A-fib     Patient c/o shortness of breath with swelling in feet & occasional irregular heart beats.     HPI:  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 was nonverbal, not following commands, not responding to noxious stimulation or moving right upper/lower extremities. Code stroke was called. After transport to ICU from radiology patient had garbled speech and was emotion. Head CT and MRI showed no acute intracranial process.   ESRD on HD, Tu, thurs, sat Has midodrine , not using consistently No HD yesterday, staffing issues at the dialysis center  Chronic anemia hemoglobin 8.6   AV graft  fistula placed on left arm  EKG personally reviewed by myself on todays visit EKG Interpretation Date/Time:  Friday February 28 2023 10:38:53 EST Ventricular Rate:  83 PR Interval:  158 QRS Duration:  174 QT Interval:  434 QTC Calculation: 509 R Axis:   27  Text Interpretation: Sinus rhythm with frequent Premature ventricular complexes Right bundle branch block When compared with ECG of 21-Feb-2023 12:04, rhythm now NSR Confirmed by Julien Nordmann [phone]) on 02/28/2023 10:48:54 AM    Cardiac imaging/testing as detailed below Echocardiogram: 10/14/21 MILD LV DYSFUNCTION (See above) WITH MILD LVH MILD RV SYSTOLIC DYSFUNCTION (See above) VALVULAR REGURGITATION: TRIVIAL AR, MILD MR, MILD PR, MODERATE TR PROSTHETIC VALVE(S): BIOPROSTHETIC AoV EF 45% LA DIAM 4.2 CM  Echo 2/24 Normal left ventricular size with reduced systolic function. LVEF 40%.  Enlarged right ventricular size with reduced function.  Normal function bioprosthetic aortic valve  Mild mitral  regurgitation.   3-Day Holter Monitor: 04/24/21 This 48 hour Holter scan was adequate for interpretation. The patient was in sinus rhythm, sinus bradycardia, with first degree AV block and bundle branch noted throughout (strip 12) Ventricular ectopic activity consisted of multifocal PVCs (strips 6,10), couplets (strip 13), triplet (strip 9) Unable to accurately quantitate supraventricular ectopy due to program setting, however there were PACs (strip 8), atrial pairs (strip 5), bigeminy (strip 7), trigeminy (strip 11) with runs of SVT. The longest was a 37 beat, 91 BPM atrial run at 4:34:15 AM (2) (strips 15,16). The fastest run was 3 beats, 131 BPM at 4:50:40 PM (2) (strip 14) There were no pauses greater than 2.0 seconds noted There were no symptoms noted in diary nor was the event button activated.   PMH:   has a  past medical history of A-fib (HCC), Allergic rhinitis, CAD (coronary artery disease), Chronic kidney disease, and  Hypertension.  PSH:    Past Surgical History:  Procedure Laterality Date   A/V FISTULAGRAM Left 02/21/2023   Procedure: A/V Fistulagram;  Surgeon: Renford Dills, MD;  Location: [facility] INVASIVE CV LAB;  Service: Cardiovascular;  Laterality: Left;   AORTIC VALVE REPAIR     AV FISTULA PLACEMENT Left 02/2022   BIOPSY  12/11/2022   Procedure: BIOPSY;  Surgeon: Norma Fredrickson, Boykin Nearing, MD;  Location: [facility] ENDOSCOPY;  Service: Gastroenterology;;   COLONOSCOPY N/A 12/13/2022   Procedure: COLONOSCOPY;  Surgeon: Toledo, Boykin Nearing, MD;  Location: [facility] ENDOSCOPY;  Service: Gastroenterology;  Laterality: N/A;   COLONOSCOPY WITH PROPOFOL N/A 12/11/2022   Procedure: COLONOSCOPY WITH PROPOFOL;  Surgeon: Toledo, Boykin Nearing, MD;  Location: [facility] ENDOSCOPY;  Service: Gastroenterology;  Laterality: N/A;   CORONARY ARTERY BYPASS GRAFT     ESOPHAGOGASTRODUODENOSCOPY (EGD) WITH PROPOFOL N/A 12/11/2022   Procedure: ESOPHAGOGASTRODUODENOSCOPY (EGD) WITH PROPOFOL;  Surgeon: Toledo, Boykin Nearing, MD;  Location: [facility] ENDOSCOPY;  Service: Gastroenterology;  Laterality: N/A;   HEMOSTASIS CLIP PLACEMENT  12/11/2022   Procedure: HEMOSTASIS CLIP PLACEMENT;  Surgeon: Norma Fredrickson, Boykin Nearing, MD;  Location: [facility] ENDOSCOPY;  Service: Gastroenterology;;   POLYPECTOMY  12/11/2022   Procedure: POLYPECTOMY;  Surgeon: Toledo, Boykin Nearing, MD;  Location: [facility] ENDOSCOPY;  Service: Gastroenterology;;    Current Outpatient Medications  Medication Sig Dispense Refill   acetaminophen (TYLENOL) 500 MG tablet Take 1,000 mg by mouth at bedtime. May take 1000 mg during the day as needed for pain     atorvastatin (LIPITOR) 20 MG tablet Take 1 tablet (20 mg total) by mouth daily. (Patient taking differently: Take 20 mg by mouth at bedtime.) 90 tablet 3   B Complex-C-Folic Acid (RENAL VITAMIN) 0.8 MG TABS Take 1 tablet by mouth daily.     Cholecalciferol (VITAMIN D3) 2000 units capsule Take 2,000 Units by mouth daily.     diclofenac Sodium (VOLTAREN) 1 % GEL  Apply 1 Application topically 4 (four) times daily as needed (pain).     ELIQUIS 2.5 MG TABS tablet Take 1 tablet (2.5 mg total) by mouth 2 (two) times daily. 180 tablet 3   fluticasone (FLONASE) 50 MCG/ACT nasal spray Place 2 sprays into both nostrils at bedtime.     furosemide (LASIX) 40 MG tablet Take 1 tablet (40 mg total) by mouth 2 (two) times daily. (Patient taking differently: Take 40 mg by mouth every evening.) 180 tablet 3   furosemide (LASIX) 80 MG tablet Take 80 mg by mouth 4 (four) times a week. Take in the morning on non-dialysis days (Sun, Mon, Wed, Fri)     losartan (COZAAR) 25 MG tablet Take 12.5 mg by mouth daily.     metoprolol tartrate (LOPRESSOR) 25 MG tablet Take 1 tablet (25 mg total) by mouth 2 (two) times daily. Hold if SBP <120 mmHg and or HR <65 (Patient taking differently: Take 25 mg by mouth 2 (two) times daily as needed (high bp).) 60 tablet 2   midodrine (PROAMATINE) 10 MG tablet Take 10 mg by mouth as needed (low blood pressure at dialysis). On dialysis days Tues, Thurs,Sat     omeprazole (PRILOSEC) 20 MG capsule Take 20 mg by mouth daily.     psyllium (METAMUCIL) 0.52 g capsule Take 0.52 g by mouth daily.     Saw Palmetto 450 MG CAPS Take 450 mg by mouth daily.     tacrolimus (PROTOPIC) 0.1 %  ointment Apply 1 Application topically daily as needed (dermatitis).     tamsulosin (FLOMAX) 0.4 MG CAPS capsule Take 0.4 mg by mouth daily after supper.     No current facility-administered medications for this visit.   Allergies:   Phenothiazines and Tape   Social History:  The patient  reports that he has quit smoking. He has never used smokeless tobacco. He reports that he does not drink alcohol and does not use drugs.   Family History:   family history includes Aneurysm in his mother; Heart attack in his brother, father, and sister.   Review of Systems: Review of Systems  Constitutional: Negative.   HENT: Negative.    Respiratory: Negative.    Cardiovascular:  Negative.   Gastrointestinal: Negative.   Musculoskeletal: Negative.   Neurological: Negative.   Psychiatric/Behavioral: Negative.    All other systems reviewed and are negative.  PHYSICAL EXAM: VS:  BP (!) 124/54 (BP Location: Left Arm, Patient Position: Sitting, Cuff Size: Normal)   Pulse 83   Ht 5\' 9"  (1.753 m)   Wt 162 lb 4 oz (73.6 kg)   SpO2 98%   BMI 23.96 kg/m  , BMI Body mass index is 23.96 kg/m. Constitutional:  oriented to person, place, and time. No distress.  HENT:  Head: Grossly normal Eyes:  no discharge. No scleral icterus.  Neck: No JVD, no carotid bruits  Cardiovascular: Regular rate and rhythm, no murmurs appreciated Pulmonary/Chest: Clear to auscultation bilaterally, no wheezes or rails Abdominal: Soft.  no distension.  no tenderness.  Musculoskeletal: Normal range of motion Neurological:  normal muscle tone. Coordination normal. No atrophy Skin: Skin warm and dry Psychiatric: normal affect, pleasant  Recent Labs: 03/20/2022: B Natriuretic Peptide 749.2 07/09/2022: TSH 1.732 12/13/2022: ALT 8 12/19/2022: Magnesium 1.9 12/20/2022: BUN 47; Creatinine, Ser 4.32; Hemoglobin 9.0; Platelets 123; Potassium 4.1; Sodium 136    Lipid Panel No results found for: "CHOL", "HDL", "LDLCALC", "TRIG"    Wt Readings from Last 3 Encounters:  02/28/23 162 lb 4 oz (73.6 kg)  02/21/23 155 lb (70.3 kg)  02/17/23 161 lb 9.6 oz (73.3 kg)    ASSESSMENT AND PLAN:  Problem List Items Addressed This Visit       Cardiology Problems   CAD s/p CABG (coronary artery disease) - Primary   Relevant Medications   losartan (COZAAR) 25 MG tablet   Other Relevant Orders   EKG 12-Lead (Completed)   EKG 12-Lead (Completed)   Other Visit Diagnoses       PAF (paroxysmal atrial fibrillation) (HCC)       Relevant Medications   losartan (COZAAR) 25 MG tablet   Other Relevant Orders   EKG 12-Lead (Completed)     Chronic diastolic heart failure (HCC)       Relevant Medications    losartan (COZAAR) 25 MG tablet   Other Relevant Orders   EKG 12-Lead (Completed)     ESRD (end stage renal disease) (HCC)         Hypotension, unspecified hypotension type       Relevant Medications   losartan (COZAAR) 25 MG tablet     PAC (premature atrial contraction)       Relevant Medications   losartan (COZAAR) 25 MG tablet   Other Relevant Orders   EKG 12-Lead (Completed)     SVT (supraventricular tachycardia) (HCC)       Relevant Medications   losartan (COZAAR) 25 MG tablet   Other Relevant Orders   EKG 12-Lead (Completed)  Coronary artery disease with stable angina Currently with no symptoms of angina. No further workup at this time. Continue current medication regimen.  S/p AVR  echocardiogram feb 2024 December 2024 stable bio prosthetic valve  Paroxysmal atrial fibrillation On reduced dose Eliquis, metoprolol tartrate 25 twice daily maintaining normal sinus rhythm Recommend close monitoring of heart rate at home  SVT Rhythm controlled with metoprolol tartrate 25 twice daily  Hypotension/orthostasis during dialysis Recommend he take midodrine 10 mg 1 hour prior to dialysis Hold losartan and metoprolol prior to hemodialysis Tuesday Thursday Saturday  Hyperlipidemia Continue Lipitor 20 Goal LDL less than 70  End-stage renal disease Has fistula on left,  Participating in hemodialysis Tuesday Thursday Saturday followed by nephrology    Signed, Dossie Arbour, M.D., Ph.D. [facility] [place], [place] [phone]

## 2023-02-28 NOTE — Patient Instructions (Signed)
 Medication Instructions:  No changes  If you need a refill on your cardiac medications before your next appointment, please call your pharmacy.    Lab work: No new labs needed   Testing/Procedures: No new testing needed   Follow-Up: At [facility], you and your health needs are our priority.  As part of our continuing mission to provide you with exceptional heart care, we have created designated Provider Care Teams.  These Care Teams include your primary Cardiologist (physician) and Advanced Practice Providers (APPs -  Physician Assistants and Nurse Practitioners) who all work together to provide you with the care you need, when you need it.  You will need a follow up appointment in 6 months  Providers on your designated Care Team:   Nicolasa Ducking, NP Eula Listen, PA-C Cadence Fransico Michael, [place]  COVID-19 Vaccine Information can be found at: [email or web address] For questions related to vaccine distribution or appointments, please email [email or web address] or call [phone].

## 2023-03-31 ENCOUNTER — Ambulatory Visit: Payer: Medicare Other | Admitting: Student

## 2023-04-04 ENCOUNTER — Other Ambulatory Visit (INDEPENDENT_AMBULATORY_CARE_PROVIDER_SITE_OTHER): Payer: Self-pay | Admitting: Vascular Surgery

## 2023-04-04 DIAGNOSIS — N186 End stage renal disease: Secondary | ICD-10-CM

## 2023-04-06 DIAGNOSIS — N186 End stage renal disease: Secondary | ICD-10-CM | POA: Insufficient documentation

## 2023-04-06 NOTE — Progress Notes (Unsigned)
 MRN : [id number]  Terry Bulkley Vellucci Sr. is a 83 y.o. (28-Apr-1940) male who presents with chief complaint of check access.  History of Present Illness:   The patient returns to the office for followup status post intervention of their dialysis access on 02/21/2023.   Procedure: Percutaneous transluminal angioplasty and stent placement cephalic vein at the cephalic subclavian confluence, peripheral segment  Following the intervention the access function has significantly improved, with better flow rates and improved KT/V. The patient has not been experiencing increased bleeding times following decannulation and the patient denies increased recirculation. The patient denies an increase in arm swelling.  However he did have an episode of infiltration about 2 hours in to his last dialysis run.  He does have some bruising and has been using ice.  At the present time the patient denies hand pain.  Duplex ultrasound of the AV access shows a patent access.  The previously noted stenosis is improved compared to last study.  Flow volume today is 2203 cc/min (previous flow volume was 2062 cc/min)     No outpatient medications have been marked as taking for the 04/07/23 encounter (Appointment) with Gilda Crease, Latina Craver, MD.    Past Medical History:  Diagnosis Date   A-fib [facility])    Allergic rhinitis    CAD (coronary artery disease)    Chronic kidney disease    Hypertension     Past Surgical History:  Procedure Laterality Date   A/V FISTULAGRAM Left 02/21/2023   Procedure: A/V Fistulagram;  Surgeon: Renford Dills, MD;  Location: [facility] INVASIVE CV LAB;  Service: Cardiovascular;  Laterality: Left;   AORTIC VALVE REPAIR     AV FISTULA PLACEMENT Left 02/2022   BIOPSY  12/11/2022   Procedure: BIOPSY;  Surgeon: Norma Fredrickson, Boykin Nearing, MD;  Location: [facility] ENDOSCOPY;  Service: Gastroenterology;;   COLONOSCOPY N/A 12/13/2022   Procedure: COLONOSCOPY;  Surgeon: Toledo, Boykin Nearing, MD;   Location: [facility] ENDOSCOPY;  Service: Gastroenterology;  Laterality: N/A;   COLONOSCOPY WITH PROPOFOL N/A 12/11/2022   Procedure: COLONOSCOPY WITH PROPOFOL;  Surgeon: Toledo, Boykin Nearing, MD;  Location: [facility] ENDOSCOPY;  Service: Gastroenterology;  Laterality: N/A;   CORONARY ARTERY BYPASS GRAFT     ESOPHAGOGASTRODUODENOSCOPY (EGD) WITH PROPOFOL N/A 12/11/2022   Procedure: ESOPHAGOGASTRODUODENOSCOPY (EGD) WITH PROPOFOL;  Surgeon: Toledo, Boykin Nearing, MD;  Location: [facility] ENDOSCOPY;  Service: Gastroenterology;  Laterality: N/A;   HEMOSTASIS CLIP PLACEMENT  12/11/2022   Procedure: HEMOSTASIS CLIP PLACEMENT;  Surgeon: Norma Fredrickson, Boykin Nearing, MD;  Location: [facility] ENDOSCOPY;  Service: Gastroenterology;;   POLYPECTOMY  12/11/2022   Procedure: POLYPECTOMY;  Surgeon: Toledo, Boykin Nearing, MD;  Location: [facility] ENDOSCOPY;  Service: Gastroenterology;;    Social History Social History   Tobacco Use   Smoking status: Former   Smokeless tobacco: Never  Vaping Use   Vaping status: Never Used  Substance Use Topics   Alcohol use: No   Drug use: No    Family History Family History  Problem Relation Age of Onset   Aneurysm Mother    Heart attack Father    Heart attack Sister    Heart attack Brother     Allergies  Allergen Reactions   Phenothiazines Other (See Comments)    Extra-pyramidal reaction.      Tape Rash     REVIEW OF SYSTEMS (Negative unless checked)  Constitutional: [] Weight loss  [] Fever  [] Chills Cardiac: [] Chest  pain   [] Chest pressure   [] Palpitations   [] Shortness of breath when laying flat   [] Shortness of breath with exertion. Vascular:  [] Pain in legs with walking   [] Pain in legs at rest  [] History of DVT   [] Phlebitis   [] Swelling in legs   [] Varicose veins   [] Non-healing ulcers Pulmonary:   [] Uses home oxygen   [] Productive cough   [] Hemoptysis   [] Wheeze  [] COPD   [] Asthma Neurologic:  [] Dizziness   [] Seizures   [] History of stroke   [] History of TIA  [] Aphasia   [] Vissual changes    [] Weakness or numbness in arm   [] Weakness or numbness in leg Musculoskeletal:   [] Joint swelling   [] Joint pain   [] Low back pain Hematologic:  [] Easy bruising  [] Easy bleeding   [] Hypercoagulable state   [] Anemic Gastrointestinal:  [] Diarrhea   [] Vomiting  [] Gastroesophageal reflux/heartburn   [] Difficulty swallowing. Genitourinary:  [x] Chronic kidney disease   [] Difficult urination  [] Frequent urination   [] Blood in urine Skin:  [] Rashes   [] Ulcers  Psychological:  [] History of anxiety   []  History of major depression.  Physical Examination  There were no vitals filed for this visit. There is no height or weight on file to calculate BMI. Gen: WD/WN, NAD Head: [place]/AT, No temporalis wasting.  Ear/Nose/Throat: Hearing grossly intact, nares w/o erythema or drainage Eyes: PER, EOMI, sclera nonicteric.  Neck: Supple, no gross masses or lesions.  No JVD.  Pulmonary:  Good air movement, no audible wheezing, no use of accessory muscles.  Cardiac: RRR, precordium non-hyperdynamic. Vascular:   Left brachiocephalic fistula good thrill good bruit Vessel Right Left  Radial Palpable Palpable  Brachial Palpable Palpable  Gastrointestinal: soft, non-distended. No guarding/no peritoneal signs.  Musculoskeletal: M/S 5/5 throughout.  No deformity.  Neurologic: CN 2-12 intact. Pain and light touch intact in extremities.  Symmetrical.  Speech is fluent. Motor exam as listed above. Psychiatric: Judgment intact, Mood & affect appropriate for pt's clinical situation. Dermatologic: No rashes or ulcers noted.  No changes consistent with cellulitis.   CBC Lab Results  Component Value Date   WBC 6.0 12/20/2022   HGB 9.0 (L) 12/20/2022   HCT 28.1 (L) 12/20/2022   MCV 91.2 12/20/2022   PLT 123 (L) 12/20/2022    BMET    Component Value Date/Time   NA 136 12/20/2022 0405   K 4.1 12/20/2022 0405   CL 102 12/20/2022 0405   CO2 25 12/20/2022 0405   GLUCOSE 83 12/20/2022 0405   BUN 47 (H) 12/20/2022  0405   CREATININE 4.32 (H) 12/20/2022 0405   CALCIUM 7.2 (L) 12/20/2022 0405   GFRNONAA 13 (L) 12/20/2022 0405   GFRAA (L) 07/01/2006 1132    51        The eGFR has been calculated using the MDRD equation. This calculation has not been validated in all clinical   CrCl cannot be calculated (Patient's most recent lab result is older than the maximum 21 days allowed.).  COAG Lab Results  Component Value Date   INR 1.2 12/17/2022   INR 1.4 (H) 12/14/2022   INR 1.3 (H) 12/12/2022    Radiology No results found.   Assessment/Plan 1. End stage renal disease (HCC) (Primary) Recommend:  The patient is doing well and currently has adequate dialysis access. The patient's dialysis center is not reporting any access issues. Flow pattern is stable when compared to the prior ultrasound.  The patient should have a duplex ultrasound of the dialysis access in 6  months. The patient will follow-up with me in the office after each ultrasound   - VAS US DUPLEX DIALYSIS ACCESS (AVF, AVG); Future  2. Primary hypertension Continue antihypertensive medications as already ordered, these medications have been reviewed and there are no changes at this time.  3. Atrial fibrillation, unspecified type (HCC) Continue antiarrhythmia medications as already ordered, these medications have been reviewed and there are no changes at this time.  Continue anticoagulation as ordered by Cardiology Service  4. Coronary artery disease of native artery of native heart with stable angina pectoris (HCC) Continue cardiac and antihypertensive medications as already ordered and reviewed, no changes at this time.  Continue statin as ordered and reviewed, no changes at this time  Nitrates PRN for chest pain    Levora Dredge, MD  04/06/2023 3:36 PM

## 2023-04-07 ENCOUNTER — Ambulatory Visit (INDEPENDENT_AMBULATORY_CARE_PROVIDER_SITE_OTHER): Payer: Medicare Other | Admitting: Vascular Surgery

## 2023-04-07 ENCOUNTER — Ambulatory Visit (INDEPENDENT_AMBULATORY_CARE_PROVIDER_SITE_OTHER): Payer: Medicare Other

## 2023-04-07 ENCOUNTER — Encounter (INDEPENDENT_AMBULATORY_CARE_PROVIDER_SITE_OTHER): Payer: Self-pay | Admitting: Vascular Surgery

## 2023-04-07 VITALS — BP 105/62 | HR 82 | Resp 18 | Ht 69.0 in | Wt 162.0 lb

## 2023-04-07 DIAGNOSIS — I25118 Atherosclerotic heart disease of native coronary artery with other forms of angina pectoris: Secondary | ICD-10-CM | POA: Diagnosis not present

## 2023-04-07 DIAGNOSIS — I10 Essential (primary) hypertension: Secondary | ICD-10-CM | POA: Diagnosis not present

## 2023-04-07 DIAGNOSIS — I4891 Unspecified atrial fibrillation: Secondary | ICD-10-CM

## 2023-04-07 DIAGNOSIS — N186 End stage renal disease: Secondary | ICD-10-CM

## 2023-06-05 ENCOUNTER — Other Ambulatory Visit: Payer: Self-pay | Admitting: Cardiovascular Disease

## 2023-07-04 ENCOUNTER — Other Ambulatory Visit: Payer: Self-pay | Admitting: Cardiovascular Disease

## 2023-07-04 DIAGNOSIS — I480 Paroxysmal atrial fibrillation: Secondary | ICD-10-CM

## 2023-07-04 NOTE — Telephone Encounter (Signed)
 Prescription refill request for Eliquis  received. Indication: Afib  Last office visit: 02/28/23 [place])  Scr: 4.6 (01/15/23)  Age: 83 Weight: 73.5kg  Appropriate dose. Refill sent.

## 2023-07-08 ENCOUNTER — Telehealth: Payer: Self-pay | Admitting: Cardiovascular Disease

## 2023-07-08 ENCOUNTER — Encounter: Payer: Self-pay | Admitting: *Deleted

## 2023-07-08 NOTE — Telephone Encounter (Signed)
 Pt c/o BP issue: STAT if pt c/o blurred vision, one-sided weakness or slurred speech.  STAT if BP is GREATER than 180/120 TODAY.  STAT if BP is LESS than 90/60 and SYMPTOMATIC TODAY  1. What is your BP concern? Low BP and fast HR  2. Have you taken any BP medication today?No, has not been taking his Metoprolol . Started taking   midodrine  (PROAMATINE ) 10 MG tablet  Before his dialysis appointments.  3. What are your last 5 BP readings?Patient stated it ranges 104-113/??  4. Are you having any other symptoms (ex. Dizziness, headache, blurred vision, passed out)? No   Pt is sch'd w/ [facility] on 07/14/23 w/ his wife. Patient does have dialysis this afternoon at 12 PM.  STAT if HR is under 50 or over 120  (normal HR is 60-100 beats per minute)  What is your heart rate? 92-103 while resting  Do you have a log of your heart rate readings (document readings)? N/a  Do you have any other symptoms? Shaking

## 2023-07-08 NOTE — Telephone Encounter (Signed)
 Spoke with Pt regarding his blood pressures; Pt states that he doesn't take his blood pressure medications because he never meets the criteria.  He is to hold his Metoprolol  for SBP <120 and he is on dialysis.  His reading this morning was 113/60 HR 92; states he has not taken any of his blood pressure medications in a long time due to going to dialysis.  He states that he never misses a dose of his Eliquis .  He states that his heart rate does go above 100 at times and was wanting to know if there is another medication other than Metoprolol  that could control just the HR;  I told him to maintain a blood pressure and HR log from today until his appointment with Barnie Hila NP and to ask questions about his medications at his appointment on 7/7.  I reassured him that he was following all the correct parameters in regards to his BP and taking his medications. He thanked me for the reassurance and calling him back.  Verbalized understanding of keeping log of his vital signs.

## 2023-07-11 NOTE — Progress Notes (Unsigned)
 Cardiology Clinic Note   Date: 07/14/2023 ID: Terry CHRISTELLA Bolt Sr., DOB [birth year], MRN [id number]  Primary Cardiologist:  Evalene Lunger, MD  Chief Complaint   Terry CHRISTELLA Duskin Sr. is a 83 y.o. male who presents to the clinic today for evaluation of heart rate.   Patient Profile   Terry Downum Glasheen Sr. is followed by Dr. Gollan for the history outlined below.      Past medical history significant for: CAD. LHC 10/31/2010 performed at [facility] health: Two-vessel CAD.  LAD with area of ectasia at D1. CABG x 2 11/06/2010 performed at [facility] health: LIMA to LAD, SVG to RCA. PAF.  DCCV 01/19/2021 performed at [facility]. SVT. Aortic insufficiency. S/p AVR 11/06/2010 performed at [facility]. Chronic diastolic heart failure. Echo 12/14/2022: EF 50 to 55%.  Mild LVH.  Grade II DD.  Hypokinesis of left ventricular, mid-- apical inferior septal wall, inferior wall and apical segment.  Normal RV function.  Moderate RVH.  Severely elevated PA pressure.  Moderate LAE.  Severe RAE.  Mild MR.  Mild mitral stenosis.  Trivial AI.  Mild aortic valve stenosis.  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  IVC dilatation, RA pressure 15 mmHg. Ascending aortic dissection. S/p repair 1974. Hypertension. Hyperlipidemia. End-stage renal disease. Left arm fistula placement. HD TuThSa.  Former tobacco abuse. Near syncope/syncope.  In summary, patient was first evaluated by Dr. Gollan on 05/29/2022 to establish care with request of Dr. Corlis.  He was previously followed by cardiology at [facility] with last visit February 2024.  LHC October 2012 showed two-vessel CAD.  Underwent CABG x 2 with AVR for aortic insufficiency on 11/06/2010.  Patient also has a remote history of ascending aortic dissection with repair in 1974.  He underwent left arm AV fistula placement for end-stage renal disease but is not currently on hemodialysis.  He has a  history of PAF with successful DCCV January 2023.   Patient underwent hospital admission from 12/5 to 12/20/2022 for rectal bleeding following colonoscopy with polypectomy the day prior to admission.  Patient reported holding Eliquis  prior to colonoscopy and had not yet restarted it.  Hospital course complicated by hypotension.  Patient was discharged on 12/20/2022 with instructions to stop losartan  and Toprol .  He was started on Lopressor  25 mg twice daily with holding parameters of SBP <120 and HR <65. Of note, patient's hospital course of was complicated by acute onset of unresponsiveness. He was nonverbal, not following commands, not responding to noxious stimulation or moving right upper/lower extremities. Code stroke was called. After transport to ICU from radiology patient had garbled speech and was emotional. Head CT and MRI showed no acute intracranial process.  He was seen in the office in December 2020 for for hospital follow-up.  He was stable at that time and decision was made to continue to hold losartan  and follow hold parameters of SBP <120 and HR <65 for metoprolol .  Patient was last seen in the office by Dr. Gollan on 02/28/2023 for routine follow-up.  He had started on hemodialysis and reported hypotension/orthostasis during dialysis.  He was instructed to hold losartan  and metoprolol  prior to dialysis and take midodrine  10 mg 1 hour prior.  Patient contacted the office on 07/08/2023 with complaints of elevated heart rate.  Per triage RN, Spoke with Pt regarding his blood pressures; Pt states that he doesn't take his blood pressure medications because he never meets the criteria.  He is to hold his Metoprolol   for SBP <120 and he is on dialysis.  His reading this morning was 113/60 HR 92; states he has not taken any of his blood pressure medications in a long time due to going to dialysis.  He states that he never misses a dose of his Eliquis .  He states that his heart rate does go above 100 at  times and was wanting to know if there is another medication other than Metoprolol  that could control just the HR;  I told him to maintain a blood pressure and HR log from today until his appointment with Terry Hila NP and to ask questions about his medications at his appointment on 7/7.  I reassured him that he was following all the correct parameters in regards to his BP and taking his medications. He thanked me for the reassurance and calling him back.  Verbalized understanding of keeping log of his vital signs.     History of Present Illness    Today, patient is accompanied by his wife. He reports noticing his BP trending down and his heart rate trending up since starting dialysis. He has been following holding parameters for losartan  and metoprolol  which means he is not taking either one because his BP is low. He is following metoprolol  holding guidelines of BP <120/60.  His heart rate can get up to 125 bpm at rest. He had cardiac awareness of arrhythmia feeling his heart beating irregularly or fast. He takes midodrine  on dialysis days but will still have his sessions cut short secondary to hypotension. He also reports that dialysis nurses have commented on his heart rate being elevated when he is checking in for dialysis. He is taking Lasix  80 mg on nondialysis days. He still makes urine but feels it is less than prior to dialysis. He feels lower extremity edema is well controlled since starting dialysis.  Upon review of his BP readings his BP tends to be <110/60 on dialysis days and >110/60 on nondialysis days. He denies shortness of breath, orthopnea or PND.     ROS: All other systems reviewed and are otherwise negative except as noted in History of Present Illness.  EKGs/Labs Reviewed    EKG Interpretation Date/Time:  Monday July 14 2023 14:02:47 EDT Ventricular Rate:  78 PR Interval:  208 QRS Duration:  164 QT Interval:  434 QTC Calculation: 494 R Axis:   36  Text  Interpretation: Normal sinus rhythm with sinus arrhythmia Right bundle branch block Possible Inferior infarct , age undetermined When compared with ECG of 28-Feb-2023 10:38, Premature ventricular complexes are no longer Present Confirmed by Hila Terry [id number] on 07/14/2023 2:05:40 PM   12/13/2022: ALT 8; AST 15 12/20/2022: BUN 47; Creatinine, Ser 4.32; Potassium 4.1; Sodium 136   12/20/2022: Hemoglobin 9.0; WBC 6.0    Risk Assessment/Calculations     CHA2DS2-VASc Score = 4   This indicates a 4.8% annual risk of stroke. The patient's score is based upon: CHF History: 0 HTN History: 1 Diabetes History: 0 Stroke History: 0 Vascular Disease History: 1 Age Score: 2 Gender Score: 0             Physical Exam    VS:  BP 138/64 (BP Location: Right Arm, Patient Position: Sitting, Cuff Size: Normal)   Pulse 78   Ht 5' 9 (1.753 m)   Wt 161 lb 8 oz (73.3 kg)   SpO2 97%   BMI 23.85 kg/m  , BMI Body mass index is 23.85 kg/m.  GEN: Well nourished, well  developed, in no acute distress. Neck: No JVD or carotid bruits. Cardiac:  RRR. Frequent extrasystole.  2/6 systolic murmur. No rubs or gallops.   Respiratory:  Respirations regular and unlabored. Clear to auscultation without rales, wheezing or rhonchi. GI: Soft, nontender, nondistended. Extremities: Radials/DP/PT 2+ and equal bilaterally. No clubbing or cyanosis. No edema.  Skin: Warm and dry, no rash. Neuro: Strength intact.  Assessment & Plan   CAD S/p CABG x 09 October 2010.  Patient denies chest pain, pressure or tightness. -Continue metoprolol  on nondialysis days (see below), atorvastatin .  Not on aspirin secondary to Eliquis .   Chronic diastolic heart failure Echo 12/14/2022 showed EF 50 to 55%, mild LVH, Grade II DD, moderate RVH, severely elevated PA pressure, BAE.  Patient reports taking Lasix  80 mg on nondialysis days. He still makes urine but not as much as before starting dialysis. He denies lower extremity edema,  orthopnea or PND.  Euvolemic and well compensated on exam.  - Decrease Lasix  to 40 mg on nondialysis days. - Volume managed by hemodialysis.   PAF DCCV January 2023 performed at [facility].  Denies spontaneous bleeding concerns.  Patient reports going in and out of afib described as feeling his heart is beating irregularly or fast. His heart rate can increase to 125 bpm while at rest. He reports dialysis nurses have noted an increased/irregular heart beat. RRR with frequent extrasystole.  - Continue Eliquis . Appropriate Eliquis  dose. - Take metoprolol  12.5 mg bid on nondialysis days for BP >110/60. Can take evening dose of metoprolol  on dialysis days if BP >110/60.  - 14 day Zio.  - Refer to EP.     Aortic insufficiency s/p AVR October 2012 Echo December 2024 showed trivial AI/mild AS. - Will please order for repeat echo in December at follow up.    Hypotension BP today 138/64. BP typically <110/60 on dialysis days and >110/60 on nondialysis days. Patient take midodrine  prior to dialysis.  - Continue to hold losartan .  - Metoprolol  12.5 mg bid on nondialysis days (see above).   Disposition: Decrease Lasix  to 40 mg on nondialysis days. Metoprolol  12.5 mg bid on nondialysis days and evening of dialysis days for BP >110/60. 14 day Zio. Refer to EP. Return in 2 months or sooner as needed.          Signed, Terry HERO. Girtha Kilgore, DNP, [place]

## 2023-07-14 ENCOUNTER — Ambulatory Visit: Attending: Student | Admitting: Student

## 2023-07-14 ENCOUNTER — Ambulatory Visit

## 2023-07-14 ENCOUNTER — Encounter: Payer: Self-pay | Admitting: Student

## 2023-07-14 VITALS — BP 138/64 | HR 78 | Ht 69.0 in | Wt 161.5 lb

## 2023-07-14 DIAGNOSIS — Z952 Presence of prosthetic heart valve: Secondary | ICD-10-CM

## 2023-07-14 DIAGNOSIS — I25118 Atherosclerotic heart disease of native coronary artery with other forms of angina pectoris: Secondary | ICD-10-CM

## 2023-07-14 DIAGNOSIS — I5032 Chronic diastolic (congestive) heart failure: Secondary | ICD-10-CM | POA: Diagnosis not present

## 2023-07-14 DIAGNOSIS — I351 Nonrheumatic aortic (valve) insufficiency: Secondary | ICD-10-CM

## 2023-07-14 DIAGNOSIS — I2510 Atherosclerotic heart disease of native coronary artery without angina pectoris: Secondary | ICD-10-CM | POA: Diagnosis not present

## 2023-07-14 DIAGNOSIS — I480 Paroxysmal atrial fibrillation: Secondary | ICD-10-CM

## 2023-07-14 DIAGNOSIS — I959 Hypotension, unspecified: Secondary | ICD-10-CM | POA: Diagnosis not present

## 2023-07-14 MED ORDER — METOPROLOL TARTRATE 25 MG PO TABS: 12.5000 mg | ORAL_TABLET | Freq: Two times a day (BID) | ORAL | 2 refills | Status: AC

## 2023-07-14 MED ORDER — FUROSEMIDE 40 MG PO TABS: 40.0000 mg | ORAL_TABLET | ORAL | 2 refills | Status: DC

## 2023-07-14 NOTE — Patient Instructions (Signed)
 Medication Instructions:   Your physician recommends the following medication changes.  DECREASE: LASIX  to 40 mg by mouth once a day on Non Dialysis Days Metoprolol  from 25 mg to 12.5 mg by mouth twice a day on Non Dialysis Days  Take all other medications as directed.   *If you need a refill on your cardiac medications before your next appointment, please call your pharmacy*  Lab Work:  No labs ordered today  If you have labs (blood work) drawn today and your tests are completely normal, you will receive your results only by: MyChart Message (if you have MyChart) OR A paper copy in the mail If you have any lab test that is abnormal or we need to change your treatment, we will call you to review the results.  Testing/Procedures:  Your physician has recommended that you wear a Zio monitor.   This monitor is a medical device that records the heart's electrical activity. Doctors most often use these monitors to diagnose arrhythmias. Arrhythmias are problems with the speed or rhythm of the heartbeat. The monitor is a small device applied to your chest. You can wear one while you do your normal daily activities. While wearing this monitor if you have any symptoms to push the button and record what you felt. Once you have worn this monitor for the period of time provider prescribed (Usually 14 days), you will return the monitor device in the postage paid box. Once it is returned they will download the data collected and provide us  with a report which the provider will then review and we will call you with those results. Important tips:  Avoid showering during the first 24 hours of wearing the monitor. Avoid excessive sweating to help maximize wear time. Do not submerge the device, no hot tubs, and no swimming pools. Keep any lotions or oils away from the patch. After 24 hours you may shower with the patch on. Take brief showers with your back facing the shower head.  Do not remove patch  once it has been placed because that will interrupt data and decrease adhesive wear time. Push the button when you have any symptoms and write down what you were feeling. Once you have completed wearing your monitor, remove and place into box which has postage paid and place in your outgoing mailbox.  If for some reason you have misplaced your box then call our office and we can provide another box and/or mail it off for you.     Follow-Up: At [facility], you and your health needs are our priority.  As part of our continuing mission to provide you with exceptional heart care, our providers are all part of one team.  This team includes your primary Cardiologist (physician) and Advanced Practice Providers or APPs (Physician Assistants and Nurse Practitioners) who all work together to provide you with the care you need, when you need it.  Your next appointment:   2 month(s)  Provider:   You may see Timothy Gollan, MD or one of the following Advanced Practice Providers on your designated Care Team:    Barnie Hila, NP

## 2023-07-16 ENCOUNTER — Encounter: Payer: Self-pay | Admitting: *Deleted

## 2023-08-08 ENCOUNTER — Other Ambulatory Visit: Payer: Self-pay | Admitting: Emergency Medicine

## 2023-08-08 DIAGNOSIS — Z952 Presence of prosthetic heart valve: Secondary | ICD-10-CM

## 2023-08-12 ENCOUNTER — Ambulatory Visit: Payer: Self-pay | Admitting: Student

## 2023-08-12 DIAGNOSIS — I25118 Atherosclerotic heart disease of native coronary artery with other forms of angina pectoris: Secondary | ICD-10-CM

## 2023-08-12 DIAGNOSIS — I480 Paroxysmal atrial fibrillation: Secondary | ICD-10-CM | POA: Diagnosis not present

## 2023-08-18 ENCOUNTER — Telehealth: Payer: Self-pay | Admitting: Cardiovascular Disease

## 2023-08-18 NOTE — Telephone Encounter (Signed)
-----   Message from Nurse Olivia HERO sent at 08/15/2023  1:30 PM EDT ----- This patient was scheduled for an echo on 08/29/23. He just needed an annual echo in December 2025. Can you please reschedule his echo?

## 2023-08-18 NOTE — Telephone Encounter (Signed)
 Unable to leave voicemail, pt's echo needs to be rescheduled from 8/22 to sometime in December. Thank you

## 2023-08-29 ENCOUNTER — Other Ambulatory Visit

## 2023-09-02 NOTE — Progress Notes (Unsigned)
 Electrophysiology Office Note:    Date:  09/03/2023   ID:  Terry CHRISTELLA Bolt Sr., DOB April 08, 1940, MRN [id number]  [facility] Cardiologist:  Evalene Lunger, MD  [facility] Electrophysiologist:  OLE ONEIDA HOLTS, MD   Referring MD: Loistine Sober, NP   Chief Complaint: Atrial fibrillation  History of Present Illness:    Mr. Postema is an 83 year old man who I am seeing today for an evaluation of atrial fibrillation at the request of Sober Flakes born.  The patient has a history of coronary artery disease with prior CABG, chronic diastolic heart failure, atrial fibrillation, aortic valve replacement in 2012.  He has end-stage renal disease and is on intermittent dialysis.  He has hypotension requiring midodrine . He had a prior cardioversion in 2023 at [facility].  The patient is with his wife today in clinic.  He reports feeling well.  He does not have any symptoms that he is aware of with his atrial fibrillation.  He checks his blood pressure and heart rate daily.  His heart rates are typically in the 90s and low 100s.  He reports intermittent hypotension that is problematic with dialysis.  He has to take midodrine  to help support his blood pressure on dialysis days.     Their past medical, social and family history was reviewed.   ROS:   Please see the history of present illness.    All other systems reviewed and are negative.  EKGs/Labs/Other Studies Reviewed:    The following studies were reviewed today:  August 12, 2023 ZIO monitor 42% burden of atrial flutter, average rate 101 bpm Frequent ventricular ectopy, 5.9%  December 15, 2022 echo EF 50 to 55% RV normal Severely dilated right atrium Moderately dilated left atrium Mild MR Mild MS Severe TR      Physical Exam:    VS:  BP 100/60 (BP Location: Left Arm, Patient Position: Sitting, Cuff Size: Normal)   Pulse (!) 103   Ht 5' 9 (1.753 m)   Wt 161 lb (73 kg)   SpO2 99%   BMI 23.78 kg/m     Wt  Readings from Last 3 Encounters:  09/03/23 161 lb (73 kg)  07/14/23 161 lb 8 oz (73.3 kg)  04/07/23 162 lb (73.5 kg)     GEN: no distress CARD: Irregularly irregular, No MRG RESP: No IWOB. CTAB.        ASSESSMENT AND PLAN:    1. Persistent atrial fibrillation (HCC)   2. Atrial flutter, unspecified type (HCC)   3. Chronic diastolic heart failure (HCC)   4. ESRD (end stage renal disease) (HCC)   5. Coronary artery disease of native artery of native heart with stable angina pectoris (HCC)   6. S/P AVR (aortic valve replacement)     #Persistent atrial fibrillation flutter Rates are just over 100 on average on the recent heart monitor.  Normal left ventricular function.  He is a poor candidate for invasive EP procedures.  I discussed using an antiarrhythmic drug to help control his heart rhythm.  We also discussed increasing rate control medications.  Given his abnormal kidney function, Tikosyn is not an option.  We discussed amiodarone in detail during today's clinic appointment.  I discussed the risks.  For now, I have asked him to take metoprolol  on an as-needed basis to avoid hypotension.  He would like to hold off on antiarrhythmics which I think is reasonable given the overall lack of symptoms.  I will tolerate a slightly higher heart rate average for  him in order to avoid exposure to antiarrhythmics and they are off target effects.  Continue Eliquis  for stroke prophylaxis  #Coronary artery disease No ischemic symptoms today  Follow-up 1 year with APP  Signed, Ole T. Cindie, MD, [facility], [facility] 09/03/2023 9:55 AM    Electrophysiology [facility]

## 2023-09-03 ENCOUNTER — Encounter: Payer: Self-pay | Admitting: Cardiology

## 2023-09-03 ENCOUNTER — Ambulatory Visit: Attending: Cardiology | Admitting: Cardiology

## 2023-09-03 VITALS — BP 100/60 | HR 103 | Ht 69.0 in | Wt 161.0 lb

## 2023-09-03 DIAGNOSIS — Z952 Presence of prosthetic heart valve: Secondary | ICD-10-CM

## 2023-09-03 DIAGNOSIS — I25118 Atherosclerotic heart disease of native coronary artery with other forms of angina pectoris: Secondary | ICD-10-CM

## 2023-09-03 DIAGNOSIS — I4819 Other persistent atrial fibrillation: Secondary | ICD-10-CM | POA: Diagnosis not present

## 2023-09-03 DIAGNOSIS — I5032 Chronic diastolic (congestive) heart failure: Secondary | ICD-10-CM | POA: Diagnosis not present

## 2023-09-03 DIAGNOSIS — I4892 Unspecified atrial flutter: Secondary | ICD-10-CM

## 2023-09-03 DIAGNOSIS — N186 End stage renal disease: Secondary | ICD-10-CM

## 2023-09-03 NOTE — Patient Instructions (Signed)

## 2023-09-28 NOTE — Progress Notes (Unsigned)
 Cardiology Office Note  Date:  09/29/2023   ID:  Terry RIO Sr., DOB 22-Aug-1940, MRN [id number]  PCP:  Terry Layman ORN, MD   Chief Complaint  Patient presents with   6 month follow up     Patient c/o A-fib spells last night with racing heart beats. Denies shortness of breath.     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 Aortic Insufficiency: S/P AVR / S/P AVR (Bioprosthetic) 2012 CKD- Stage 5 / ESRD / Anemia of chronic disease, on hemodialysis Hyperlipidemia: Statin History of vasovagal syncope related to blood draws EF 50 to 55% Who presents to establish care for his coronary disease, atrial fibrillation  Last seen by myself in clinic 2/25 Seen by EP September 03, 2023 Persistent atrial fibrillation Poor candidate for invasive EP procedures Start of amiodarone  held off given overall lack of symptoms Not a candidate for Tikosyn On Eliquis   Zio monitor August 2025 Normal sinus rhythm with paroxysmal atrial flutter 42% of the time longest episode 8 hours 44 minutes  In follow-up today he reports having more symptoms of symptomatic palpitations, tachycardia This past weekend, 3 am Sunday morning palpitations woke him up,  heart rate 153, took metoprolol , Got back to sleep 4 Am This Am rate 124, in the clinic this morning rate low 100s Having more days/episodes of tachycardia Wife reports he is  not sleeping well with rhythm, some fatigue, can't do anything  Does HD on tu/thurs/sat Take midodrine  before HD Does not take metoprolol  daily, only prn when he appreciates palpitations  No recent labs available, labs done through nephrology at dialysis Last HGB 8.6 eight months ago  EKG personally reviewed by myself on todays visit EKG Interpretation Date/Time:  Monday September 29 2023 08:19:16 EDT Ventricular Rate:  101 PR Interval:    QRS Duration:  158 QT Interval:  364 QTC Calculation: 471 R Axis:   58  Text Interpretation: Atrial flutter with variable A-V block with premature ventricular or aberrantly conducted complexes Right bundle branch block When compared with ECG of 14-Jul-2023 14:02, Atrial flutter has replaced Sinus rhythm Confirmed by Perla Lye [phone]) on 09/29/2023 8:22:17 AM   Other past medical history reviewed hospital admission from 12/5 to 12/13 records reviewed in detail  rectal bleeding following colonoscopy with polypectomy the day prior  Hospital course complicated by hypotension and by acute onset of unresponsiveness. was nonverbal, not following commands, not responding to noxious stimulation or moving right upper/lower extremities. Code stroke was called. After transport to ICU from radiology patient had garbled speech and was emotion. Head CT and MRI showed no acute intracranial process.    AV graft fistula placed on left arm  Cardiac imaging/testing as detailed below Echocardiogram: 10/14/21 MILD LV DYSFUNCTION (See above) WITH MILD LVH MILD RV SYSTOLIC DYSFUNCTION (See above) VALVULAR REGURGITATION: TRIVIAL AR, MILD MR, MILD PR, MODERATE TR PROSTHETIC VALVE(S): BIOPROSTHETIC AoV EF 45% LA DIAM 4.2 CM  Echo 2/24 Normal left ventricular size with reduced systolic function. LVEF 40%.  Enlarged right ventricular size with reduced function.  Normal function bioprosthetic aortic  valve  Mild mitral  regurgitation.   3-Day Holter Monitor:  04/24/21 This 48 hour Holter scan was adequate for interpretation. The patient was in sinus rhythm, sinus bradycardia, with first degree AV block and bundle branch noted throughout (strip 12) Ventricular ectopic activity consisted of multifocal PVCs (strips 6,10), couplets (strip 13), triplet (strip 9) Unable to accurately quantitate supraventricular ectopy due to program setting, however there were PACs (strip 8), atrial pairs (strip 5), bigeminy (strip 7), trigeminy (strip 11) with runs of SVT. The longest was a 37 beat, 91 BPM atrial run at 4:34:15 AM (2) (strips 15,16). The fastest run was 3 beats, 131 BPM at 4:50:40 PM (2) (strip 14) There were no pauses greater than 2.0 seconds noted There were no symptoms noted in diary nor was the event button activated.   PMH:   has a past medical history of A-fib (HCC), Allergic rhinitis, CAD (coronary artery disease), Chronic kidney disease, and Hypertension.  PSH:    Past Surgical History:  Procedure Laterality Date   A/V FISTULAGRAM Left 02/21/2023   Procedure: A/V Fistulagram;  Surgeon: Jama Cordella MATSU, MD;  Location: [facility] INVASIVE CV LAB;  Service: Cardiovascular;  Laterality: Left;   AORTIC VALVE REPAIR     AV FISTULA PLACEMENT Left 02/2022   BIOPSY  12/11/2022   Procedure: BIOPSY;  Surgeon: Aundria, Ladell POUR, MD;  Location: [facility] ENDOSCOPY;  Service: Gastroenterology;;   COLONOSCOPY N/A 12/13/2022   Procedure: COLONOSCOPY;  Surgeon: Toledo, Ladell POUR, MD;  Location: [facility] ENDOSCOPY;  Service: Gastroenterology;  Laterality: N/A;   COLONOSCOPY WITH PROPOFOL  N/A 12/11/2022   Procedure: COLONOSCOPY WITH PROPOFOL ;  Surgeon: Toledo, Ladell POUR, MD;  Location: [facility] ENDOSCOPY;  Service: Gastroenterology;  Laterality: N/A;   CORONARY ARTERY BYPASS GRAFT     ESOPHAGOGASTRODUODENOSCOPY (EGD) WITH PROPOFOL  N/A 12/11/2022   Procedure: ESOPHAGOGASTRODUODENOSCOPY (EGD) WITH PROPOFOL ;  Surgeon: Toledo, Ladell POUR, MD;  Location: [facility] ENDOSCOPY;  Service:  Gastroenterology;  Laterality: N/A;   HEMOSTASIS CLIP PLACEMENT  12/11/2022   Procedure: HEMOSTASIS CLIP PLACEMENT;  Surgeon: Aundria, Ladell POUR, MD;  Location: [facility] ENDOSCOPY;  Service: Gastroenterology;;   POLYPECTOMY  12/11/2022   Procedure: POLYPECTOMY;  Surgeon: Toledo, Ladell POUR, MD;  Location: [facility] ENDOSCOPY;  Service: Gastroenterology;;    Current Outpatient Medications  Medication Sig Dispense Refill   acetaminophen  (TYLENOL ) 500 MG tablet Take 1,000 mg by mouth at bedtime. May take 1000 mg during the day as needed for pain     atorvastatin  (LIPITOR) 20 MG tablet TAKE 1 TABLET(20 MG) BY MOUTH DAILY 90 tablet 2   B Complex-C-Folic Acid  (RENAL VITAMIN) 0.8 MG TABS Take 1 tablet by mouth daily.     diclofenac Sodium (VOLTAREN) 1 % GEL Apply 1 Application topically 4 (four) times daily as needed (pain).     ELIQUIS  2.5 MG TABS tablet TAKE 1 TABLET(2.5 MG) BY MOUTH TWICE DAILY 180 tablet 1   fluticasone (FLONASE) 50 MCG/ACT nasal spray Place 2 sprays into both nostrils at bedtime.     furosemide  (LASIX ) 40 MG tablet Take 1 tablet (40 mg total) by mouth 4 (four) times a week. On NON DIALYSIS DAYS 64 tablet 2   lidocaine -prilocaine (EMLA) cream Apply topically 3 (three) times a week.     metoprolol  tartrate (LOPRESSOR ) 25 MG tablet Take 0.5 tablets (12.5 mg total) by mouth 2 (two) times daily. Hold if SBP <110 mmHg and or HR <60 on NON DIALYSIS DAYS 128 tablet 2   midodrine  (PROAMATINE ) 10 MG tablet Take 1 tablet (10  mg total) by mouth 3 (three) times daily as needed (low blood pressure at dialysis). On dialysis days Tues, Thurs,Sat 90 tablet 3   omeprazole (PRILOSEC) 20 MG capsule Take 20 mg by mouth daily. (Patient taking differently: Take 20 mg by mouth daily as needed.)     psyllium (METAMUCIL) 0.52 g capsule Take 0.52 g by mouth daily.     Saw Palmetto 450 MG CAPS Take 450 mg by mouth daily.     tacrolimus (PROTOPIC) 0.1 % ointment Apply 1 Application topically daily as needed (dermatitis).      tamsulosin  (FLOMAX ) 0.4 MG CAPS capsule Take 0.4 mg by mouth daily after supper.     methylPREDNISolone  (MEDROL  DOSEPAK) 4 MG TBPK tablet Take by mouth as directed. (Patient not taking: Reported on 09/29/2023)     No current facility-administered medications for this visit.   Allergies:   Phenothiazines, Prochlorperazine, Prochlorperazine edisylate, Tape, and Wound dressing adhesive   Social History:  The patient  reports that he has quit smoking. He has never used smokeless tobacco. He reports that he does not drink alcohol and does not use drugs.   Family History:   family history includes Aneurysm in his mother; Heart attack in his brother, father, and sister.   Review of Systems: Review of Systems  Constitutional: Negative.   HENT: Negative.    Respiratory: Negative.    Cardiovascular: Negative.   Gastrointestinal: Negative.   Musculoskeletal: Negative.   Neurological: Negative.   Psychiatric/Behavioral: Negative.    All other systems reviewed and are negative.  PHYSICAL EXAM: VS:  BP 108/60 (BP Location: Left Arm, Patient Position: Sitting, Cuff Size: Normal)   Pulse (!) 101   Ht 5' 9 (1.753 m)   Wt 163 lb 2 oz (74 kg)   SpO2 99%   BMI 24.09 kg/m  , BMI Body mass index is 24.09 kg/m. Constitutional:  oriented to person, place, and time. No distress.  HENT:  Head: Grossly normal Eyes:  no discharge. No scleral icterus.  Neck: No JVD, no carotid bruits  Cardiovascular: Irregularly irregular, no murmurs appreciated Pulmonary/Chest: Clear to auscultation bilaterally, no wheezes or rails Abdominal: Soft.  no distension.  no tenderness.  Musculoskeletal: Normal range of motion Neurological:  normal muscle tone. Coordination normal. No atrophy Skin: Skin warm and dry Psychiatric: normal affect, pleasant  Recent Labs: 12/13/2022: ALT 8 12/19/2022: Magnesium  1.9 12/20/2022: BUN 47; Creatinine, Ser 4.32; Hemoglobin 9.0; Platelets 123; Potassium 4.1; Sodium 136     Lipid Panel No results found for: CHOL, HDL, LDLCALC, TRIG    Wt Readings from Last 3 Encounters:  09/29/23 163 lb 2 oz (74 kg)  09/03/23 161 lb (73 kg)  07/14/23 161 lb 8 oz (73.3 kg)    ASSESSMENT AND PLAN:  Problem List Items Addressed This Visit       Cardiology Problems   A-fib [facility]) - Primary   Relevant Orders   EKG 12-Lead (Completed)   CAD s/p CABG (coronary artery disease)   Relevant Orders   EKG 12-Lead (Completed)     Other   CKD (chronic kidney disease) stage 4, 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)   Other Visit Diagnoses       Atrial flutter, unspecified type (HCC)       Relevant Orders   EKG 12-Lead (Completed)     Chronic diastolic heart failure (HCC)       Relevant Orders   EKG 12-Lead (Completed)     ESRD (end stage renal disease) (HCC)  S/P AVR (aortic valve replacement)       Relevant Orders   EKG 12-Lead (Completed)     Hypotension, unspecified hypotension type         Aortic valve insufficiency, etiology of cardiac valve disease unspecified          Paroxysmal atrial fibrillation/flutter Zio monitor showing 42% a flutter burden Symptomatic recently with elevated rates Not taking metoprolol  on a regular basis given low blood pressure, interfering with dialysis, only takes metoprolol  as needed -Discussed various treatment options with him, given his worsening symptoms, recommended antiarrhythmic therapy in effort to maintain normal sinus rhythm - Recommended he start amiodarone  400 twice daily for 7 days then down to 200 twice daily Recommend he continue to take metoprolol  for symptomatic palpitations and as blood pressure permits  Coronary artery disease with stable angina Currently with no symptoms of angina. No further workup at this time. Continue current medication regimen.  S/p AVR echocardiogram  December 2024 stable valve  SVT Currently only taking metoprolol  as needed Will initiate amiodarone  as  above  Hypotension/orthostasis during dialysis Recommend midodrine  prior to dialysis Taking metoprolol  as needed He monitors blood pressure carefully at home  Hyperlipidemia Continue Lipitor 20 Goal LDL less than 70 Lab work followed by primary care and dialysis  End-stage renal disease Has fistula on left,  Participating in hemodialysis Tuesday Thursday Saturday followed by nephrology    Signed, Velinda Lunger, M.D., Ph.D. [facility] [place], [place] [phone]

## 2023-09-29 ENCOUNTER — Encounter: Payer: Self-pay | Admitting: Cardiovascular Disease

## 2023-09-29 ENCOUNTER — Ambulatory Visit: Attending: Cardiovascular Disease | Admitting: Cardiovascular Disease

## 2023-09-29 VITALS — BP 108/60 | HR 101 | Ht 69.0 in | Wt 163.1 lb

## 2023-09-29 DIAGNOSIS — Z952 Presence of prosthetic heart valve: Secondary | ICD-10-CM

## 2023-09-29 DIAGNOSIS — I25118 Atherosclerotic heart disease of native coronary artery with other forms of angina pectoris: Secondary | ICD-10-CM

## 2023-09-29 DIAGNOSIS — I5032 Chronic diastolic (congestive) heart failure: Secondary | ICD-10-CM | POA: Diagnosis not present

## 2023-09-29 DIAGNOSIS — I4892 Unspecified atrial flutter: Secondary | ICD-10-CM | POA: Diagnosis not present

## 2023-09-29 DIAGNOSIS — I4819 Other persistent atrial fibrillation: Secondary | ICD-10-CM

## 2023-09-29 DIAGNOSIS — N186 End stage renal disease: Secondary | ICD-10-CM

## 2023-09-29 DIAGNOSIS — I959 Hypotension, unspecified: Secondary | ICD-10-CM

## 2023-09-29 DIAGNOSIS — I351 Nonrheumatic aortic (valve) insufficiency: Secondary | ICD-10-CM

## 2023-09-29 DIAGNOSIS — N184 Chronic kidney disease, stage 4 (severe): Secondary | ICD-10-CM

## 2023-09-29 MED ORDER — AMIODARONE HCL 200 MG PO TABS: ORAL_TABLET | ORAL | 3 refills | Status: DC

## 2023-09-29 NOTE — Patient Instructions (Addendum)
 Medication Instructions:  Please start amiodarone  400 mg twice a day for 7 days,  Then decrease down to 200 mg twice a day  If you need a refill on your cardiac medications before your next appointment, please call your pharmacy.   Lab work: No new labs needed  Testing/Procedures: No new testing needed  Follow-Up: At [facility], you and your health needs are our priority.  As part of our continuing mission to provide you with exceptional heart care, we have created designated Provider Care Teams.  These Care Teams include your primary Cardiologist (physician) and Advanced Practice Providers (APPs -  Physician Assistants and Nurse Practitioners) who all work together to provide you with the care you need, when you need it.  Follow up with EP in 1 month You will need a follow up appointment in 3 month, gollan or APP  Providers on your designated Care Team:   Lonni Meager, NP Bernardino Bring, PA-C Cadence Franchester, PA-C  COVID-19 Vaccine Information can be found at: [email or web address] For questions related to vaccine distribution or appointments, please email [email or web address] or call [phone].

## 2023-10-05 NOTE — Progress Notes (Deleted)
 MRN : [id number]  Terry Printy Lacivita Sr. is a 83 y.o. (Sep 18, 1940) male who presents with chief complaint of check access.  History of Present Illness:   The patient returns to the office for followup status post intervention of their dialysis access on 02/21/2023.    Procedure: Percutaneous transluminal angioplasty and stent placement cephalic vein at the cephalic subclavian confluence, peripheral segment   Following the intervention the access function has significantly improved, with better flow rates and improved KT/V. The patient has not been experiencing increased bleeding times following decannulation and the patient denies increased recirculation. The patient denies an increase in arm swelling.  However he did have an episode of infiltration about 2 hours in to his last dialysis run.  He does have some bruising and has been using ice.  At the present time the patient denies hand pain.   Duplex ultrasound of the AV access shows a patent access.  The previously noted stenosis is improved compared to last study.  Flow volume today is 2203 cc/min (previous flow volume was 2062 cc/min)  No outpatient medications have been marked as taking for the 10/06/23 encounter (Appointment) with Jama, Cordella MATSU, MD.    Past Medical History:  Diagnosis Date   A-fib [facility])    Allergic rhinitis    CAD (coronary artery disease)    Chronic kidney disease    Hypertension     Past Surgical History:  Procedure Laterality Date   A/V FISTULAGRAM Left 02/21/2023   Procedure: A/V Fistulagram;  Surgeon: Jama Cordella MATSU, MD;  Location: [facility] INVASIVE CV LAB;  Service: Cardiovascular;  Laterality: Left;   AORTIC VALVE REPAIR     AV FISTULA PLACEMENT Left 02/2022   BIOPSY  12/11/2022   Procedure: BIOPSY;  Surgeon: Aundria, Ladell POUR, MD;  Location: [facility] ENDOSCOPY;  Service: Gastroenterology;;   COLONOSCOPY N/A 12/13/2022   Procedure: COLONOSCOPY;  Surgeon: Toledo, Ladell POUR, MD;   Location: [facility] ENDOSCOPY;  Service: Gastroenterology;  Laterality: N/A;   COLONOSCOPY WITH PROPOFOL  N/A 12/11/2022   Procedure: COLONOSCOPY WITH PROPOFOL ;  Surgeon: Toledo, Ladell POUR, MD;  Location: [facility] ENDOSCOPY;  Service: Gastroenterology;  Laterality: N/A;   CORONARY ARTERY BYPASS GRAFT     ESOPHAGOGASTRODUODENOSCOPY (EGD) WITH PROPOFOL  N/A 12/11/2022   Procedure: ESOPHAGOGASTRODUODENOSCOPY (EGD) WITH PROPOFOL ;  Surgeon: Toledo, Ladell POUR, MD;  Location: [facility] ENDOSCOPY;  Service: Gastroenterology;  Laterality: N/A;   HEMOSTASIS CLIP PLACEMENT  12/11/2022   Procedure: HEMOSTASIS CLIP PLACEMENT;  Surgeon: Aundria, Ladell POUR, MD;  Location: [facility] ENDOSCOPY;  Service: Gastroenterology;;   POLYPECTOMY  12/11/2022   Procedure: POLYPECTOMY;  Surgeon: Toledo, Ladell POUR, MD;  Location: [facility] ENDOSCOPY;  Service: Gastroenterology;;    Social History Social History   Tobacco Use   Smoking status: Former   Smokeless tobacco: Never  Vaping Use   Vaping status: Never Used  Substance Use Topics   Alcohol use: No   Drug use: No    Family History Family History  Problem Relation Age of Onset   Aneurysm Mother    Heart attack Father    Heart attack Sister    Heart attack Brother     Allergies  Allergen Reactions   Phenothiazines Other (See Comments)    Extra-pyramidal reaction.      Prochlorperazine Other (See Comments)    prochlorperazine   Prochlorperazine Edisylate Other (See Comments)    Extra-pyramidal   Tape  Rash   Wound Dressing Adhesive Dermatitis     REVIEW OF SYSTEMS (Negative unless checked)  Constitutional: [] Weight loss  [] Fever  [] Chills Cardiac: [] Chest pain   [] Chest pressure   [] Palpitations   [] Shortness of breath when laying flat   [] Shortness of breath with exertion. Vascular:  [] Pain in legs with walking   [] Pain in legs at rest  [] History of DVT   [] Phlebitis   [] Swelling in legs   [] Varicose veins   [] Non-healing ulcers Pulmonary:   [] Uses home oxygen    [] Productive cough   [] Hemoptysis   [] Wheeze  [] COPD   [] Asthma Neurologic:  [] Dizziness   [] Seizures   [] History of stroke   [] History of TIA  [] Aphasia   [] Vissual changes   [] Weakness or numbness in arm   [] Weakness or numbness in leg Musculoskeletal:   [] Joint swelling   [] Joint pain   [] Low back pain Hematologic:  [] Easy bruising  [] Easy bleeding   [] Hypercoagulable state   [] Anemic Gastrointestinal:  [] Diarrhea   [] Vomiting  [] Gastroesophageal reflux/heartburn   [] Difficulty swallowing. Genitourinary:  [x] Chronic kidney disease   [] Difficult urination  [] Frequent urination   [] Blood in urine Skin:  [] Rashes   [] Ulcers  Psychological:  [] History of anxiety   []  History of major depression.  Physical Examination  There were no vitals filed for this visit. There is no height or weight on file to calculate BMI. Gen: WD/WN, NAD Head: [place]/AT, No temporalis wasting.  Ear/Nose/Throat: Hearing grossly intact, nares w/o erythema or drainage Eyes: PER, EOMI, sclera nonicteric.  Neck: Supple, no gross masses or lesions.  No JVD.  Pulmonary:  Good air movement, no audible wheezing, no use of accessory muscles.  Cardiac: RRR, precordium non-hyperdynamic. Vascular:   *** Vessel Right Left  Radial Palpable Palpable  Brachial Palpable Palpable  Gastrointestinal: soft, non-distended. No guarding/no peritoneal signs.  Musculoskeletal: M/S 5/5 throughout.  No deformity.  Neurologic: CN 2-12 intact. Pain and light touch intact in extremities.  Symmetrical.  Speech is fluent. Motor exam as listed above. Psychiatric: Judgment intact, Mood & affect appropriate for pt's clinical situation. Dermatologic: No rashes or ulcers noted.  No changes consistent with cellulitis.   CBC Lab Results  Component Value Date   WBC 6.0 12/20/2022   HGB 9.0 (L) 12/20/2022   HCT 28.1 (L) 12/20/2022   MCV 91.2 12/20/2022   PLT 123 (L) 12/20/2022    BMET    Component Value Date/Time   NA 136 12/20/2022 0405   K  4.1 12/20/2022 0405   CL 102 12/20/2022 0405   CO2 25 12/20/2022 0405   GLUCOSE 83 12/20/2022 0405   BUN 47 (H) 12/20/2022 0405   CREATININE 4.32 (H) 12/20/2022 0405   CALCIUM  7.2 (L) 12/20/2022 0405   GFRNONAA 13 (L) 12/20/2022 0405   GFRAA (L) 07/01/2006 1132    51        The eGFR has been calculated using the MDRD equation. This calculation has not been validated in all clinical   CrCl cannot be calculated (Patient's most recent lab result is older than the maximum 21 days allowed.).  COAG Lab Results  Component Value Date   INR 1.2 12/17/2022   INR 1.4 (H) 12/14/2022   INR 1.3 (H) 12/12/2022    Radiology No results found.   Assessment/Plan There are no diagnoses linked to this encounter.   Cordella Shawl, MD  10/05/2023 3:47 PM

## 2023-10-06 ENCOUNTER — Encounter (INDEPENDENT_AMBULATORY_CARE_PROVIDER_SITE_OTHER)

## 2023-10-06 ENCOUNTER — Ambulatory Visit (INDEPENDENT_AMBULATORY_CARE_PROVIDER_SITE_OTHER): Admitting: Vascular Surgery

## 2023-10-06 DIAGNOSIS — I25118 Atherosclerotic heart disease of native coronary artery with other forms of angina pectoris: Secondary | ICD-10-CM

## 2023-10-06 DIAGNOSIS — I4891 Unspecified atrial fibrillation: Secondary | ICD-10-CM

## 2023-10-06 DIAGNOSIS — N186 End stage renal disease: Secondary | ICD-10-CM

## 2023-10-06 DIAGNOSIS — I10 Essential (primary) hypertension: Secondary | ICD-10-CM

## 2023-11-04 NOTE — Progress Notes (Unsigned)
 Electrophysiology Office Follow up Visit Note:    Date:  11/05/2023   ID:  Terry CHRISTELLA Bolt Sr., DOB 10/19/1940, MRN [id number]  PCP:  Lenon Layman ORN, MD  [facility] Cardiologist:  Evalene Lunger, MD  [facility] Electrophysiologist:  OLE ONEIDA HOLTS, MD    Interval History:     Terry CHRISTELLA Hankin Sr. is a 83 y.o. male who presents for a follow up visit.   I last saw the patient September 03, 2023 for persistent atrial fibrillation and flutter.  At the last appointment we discussed treatment options for his atrial fibrillation.  He was not felt to be a candidate for invasive procedures.  I discussed amiodarone  which would be his only antiarrhythmic option but the patient wished to hold at that time.  He was asymptomatic.  He was on Eliquis  for stroke prophylaxis.  We plan for 1 year follow-up.  He then saw Dr. Gollan September 22.  At that appointment amiodarone  was started. At that appointment he discussed more symptoms associated with his rapid heart rates.  He is with his wife today in clinic.  He is feeling okay.  Tolerating the amiodarone .        Past medical, surgical, social and family history were reviewed.  ROS:   Please see the history of present illness.    All other systems reviewed and are negative.  EKGs/Labs/Other Studies Reviewed:    The following studies were reviewed today:     EKG Interpretation Date/Time:  Wednesday November 05 2023 10:40:01 EDT Ventricular Rate:  71 PR Interval:    QRS Duration:  176 QT Interval:  502 QTC Calculation: 545 R Axis:   37  Text Interpretation: Atrial flutter Right bundle branch block Reconfirmed by Holts Ole [phone]) on 11/05/2023 10:42:57 AM    Physical Exam:    VS:  BP (!) 90/45 (BP Location: Right Arm, Patient Position: Sitting, Cuff Size: Normal)   Pulse 71 Comment: 76 oximeter  Ht 5' 9 (1.753 m)   Wt 164 lb 9.6 oz (74.7 kg)   SpO2 98%   BMI 24.31 kg/m     Wt Readings from Last 3  Encounters:  11/05/23 164 lb 9.6 oz (74.7 kg)  09/29/23 163 lb 2 oz (74 kg)  09/03/23 161 lb (73 kg)     GEN: no distress.   CARD: Irregularly irregular, No MRG RESP: No IWOB. CTAB.      ASSESSMENT:    1. Persistent atrial fibrillation (HCC)   2. ESRD (end stage renal disease) (HCC)   3. Hypotension, unspecified hypotension type   4. Atrial flutter, unspecified type (HCC)   5. Encounter for long-term (current) use of high-risk medication    PLAN:    In order of problems listed above:  #Persistent atrial fibrillation flutter Recently started on amiodarone  by Dr. Lunger at September 29, 2023 clinic appointment. Now on amiodarone  200 mg by mouth twice daily Recommend reducing dose of amiodarone  to 200 mg by mouth once daily He will need a cardioversion.  I discussed the cardioversion procedure in detail with the patient including the risks and he wishes to proceed.  He has not missed a dose of Eliquis  in 1 month. Continue Eliquis  for stroke prophylaxis  #Coronary artery disease No ischemic symptoms  #Hypertension Takes midodrine  on dialysis days  #End-stage renal disease On hemodialysis 3 times weekly  I discussed my upcoming departure from [facility].  He will transition his care to one of my partners, Dr. Kennyth.  Plan  follow-up with Elvie in 6 to 8 weeks.   Signed, Ole Holts, MD, [facility], [facility] 11/05/2023 10:52 AM    Electrophysiology [facility]

## 2023-11-04 NOTE — H&P (View-Only) (Signed)
 Electrophysiology Office Follow up Visit Note:    Date:  11/05/2023   ID:  Terry CHRISTELLA Bolt Sr., DOB 10/19/1940, MRN [id number]  PCP:  Lenon Layman ORN, MD  [facility] Cardiologist:  Evalene Lunger, MD  [facility] Electrophysiologist:  OLE ONEIDA HOLTS, MD    Interval History:     Terry CHRISTELLA Hankin Sr. is a 83 y.o. male who presents for a follow up visit.   I last saw the patient September 03, 2023 for persistent atrial fibrillation and flutter.  At the last appointment we discussed treatment options for his atrial fibrillation.  He was not felt to be a candidate for invasive procedures.  I discussed amiodarone  which would be his only antiarrhythmic option but the patient wished to hold at that time.  He was asymptomatic.  He was on Eliquis  for stroke prophylaxis.  We plan for 1 year follow-up.  He then saw Dr. Gollan September 22.  At that appointment amiodarone  was started. At that appointment he discussed more symptoms associated with his rapid heart rates.  He is with his wife today in clinic.  He is feeling okay.  Tolerating the amiodarone .        Past medical, surgical, social and family history were reviewed.  ROS:   Please see the history of present illness.    All other systems reviewed and are negative.  EKGs/Labs/Other Studies Reviewed:    The following studies were reviewed today:     EKG Interpretation Date/Time:  Wednesday November 05 2023 10:40:01 EDT Ventricular Rate:  71 PR Interval:    QRS Duration:  176 QT Interval:  502 QTC Calculation: 545 R Axis:   37  Text Interpretation: Atrial flutter Right bundle branch block Reconfirmed by Holts Ole [phone]) on 11/05/2023 10:42:57 AM    Physical Exam:    VS:  BP (!) 90/45 (BP Location: Right Arm, Patient Position: Sitting, Cuff Size: Normal)   Pulse 71 Comment: 76 oximeter  Ht 5' 9 (1.753 m)   Wt 164 lb 9.6 oz (74.7 kg)   SpO2 98%   BMI 24.31 kg/m     Wt Readings from Last 3  Encounters:  11/05/23 164 lb 9.6 oz (74.7 kg)  09/29/23 163 lb 2 oz (74 kg)  09/03/23 161 lb (73 kg)     GEN: no distress.   CARD: Irregularly irregular, No MRG RESP: No IWOB. CTAB.      ASSESSMENT:    1. Persistent atrial fibrillation (HCC)   2. ESRD (end stage renal disease) (HCC)   3. Hypotension, unspecified hypotension type   4. Atrial flutter, unspecified type (HCC)   5. Encounter for long-term (current) use of high-risk medication    PLAN:    In order of problems listed above:  #Persistent atrial fibrillation flutter Recently started on amiodarone  by Dr. Lunger at September 29, 2023 clinic appointment. Now on amiodarone  200 mg by mouth twice daily Recommend reducing dose of amiodarone  to 200 mg by mouth once daily He will need a cardioversion.  I discussed the cardioversion procedure in detail with the patient including the risks and he wishes to proceed.  He has not missed a dose of Eliquis  in 1 month. Continue Eliquis  for stroke prophylaxis  #Coronary artery disease No ischemic symptoms  #Hypertension Takes midodrine  on dialysis days  #End-stage renal disease On hemodialysis 3 times weekly  I discussed my upcoming departure from [facility].  He will transition his care to one of my partners, Dr. Kennyth.  Plan  follow-up with Elvie in 6 to 8 weeks.   Signed, Ole Holts, MD, [facility], [facility] 11/05/2023 10:52 AM    Electrophysiology [facility]

## 2023-11-05 ENCOUNTER — Other Ambulatory Visit: Payer: Self-pay

## 2023-11-05 ENCOUNTER — Ambulatory Visit: Attending: Cardiology | Admitting: Cardiology

## 2023-11-05 ENCOUNTER — Encounter: Payer: Self-pay | Admitting: Cardiology

## 2023-11-05 VITALS — BP 90/45 | HR 71 | Ht 69.0 in | Wt 164.6 lb

## 2023-11-05 DIAGNOSIS — Z79899 Other long term (current) drug therapy: Secondary | ICD-10-CM

## 2023-11-05 DIAGNOSIS — N186 End stage renal disease: Secondary | ICD-10-CM

## 2023-11-05 DIAGNOSIS — I4892 Unspecified atrial flutter: Secondary | ICD-10-CM | POA: Diagnosis not present

## 2023-11-05 DIAGNOSIS — I4819 Other persistent atrial fibrillation: Secondary | ICD-10-CM

## 2023-11-05 DIAGNOSIS — I959 Hypotension, unspecified: Secondary | ICD-10-CM

## 2023-11-05 MED ORDER — AMIODARONE HCL 200 MG PO TABS: 200.0000 mg | ORAL_TABLET | Freq: Every day | ORAL | 3 refills | Status: AC

## 2023-11-05 NOTE — Patient Instructions (Signed)
 Medication Instructions:  Your physician has recommended you make the following change in your medication:  1) DECREASE amiodarone  to 200 mg once daily *If you need a refill on your cardiac medications before your next appointment, please call your pharmacy*  Lab Work: TODAY: BMET and CBC  Testing/Procedures: Cardioversion Your physician has recommended that you have a Cardioversion (DCCV). Electrical Cardioversion uses a jolt of electricity to your heart either through paddles or wired patches attached to your chest. This is a controlled, usually prescheduled, procedure. Defibrillation is done under light anesthesia in the hospital, and you usually go home the day of the procedure. This is done to get your heart back into a normal rhythm. You are not awake for the procedure. Please see the instruction sheet given to you today.  Follow-Up: At [facility], you and your health needs are our priority.  As part of our continuing mission to provide you with exceptional heart care, our providers are all part of one team.  This team includes your primary Cardiologist (physician) and Advanced Practice Providers or APPs (Physician Assistants and Nurse Practitioners) who all work together to provide you with the care you need, when you need it.  Your next appointment:   3-4 weeks  Provider:   Suzann Riddle, NP

## 2023-11-06 LAB — BASIC METABOLIC PANEL WITH GFR
BUN/Creatinine Ratio: 10 (ref 10–24)
BUN: 46 mg/dL — ABNORMAL HIGH (ref 8–27)
CO2: 26 mmol/L (ref 20–29)
Calcium: 8.7 mg/dL (ref 8.6–10.2)
Chloride: 98 mmol/L (ref 96–106)
Creatinine, Ser: 4.69 mg/dL — ABNORMAL HIGH (ref 0.76–1.27)
Glucose: 90 mg/dL (ref 70–99)
Potassium: 4.2 mmol/L (ref 3.5–5.2)
Sodium: 144 mmol/L (ref 134–144)
eGFR: 12 mL/min/1.73 — ABNORMAL LOW (ref >59)

## 2023-11-06 LAB — CBC
Hematocrit: 31.3 % — ABNORMAL LOW (ref 37.5–51.0)
Hemoglobin: 10.1 g/dL — ABNORMAL LOW (ref 13.0–17.7)
MCH: 32 pg (ref 26.6–33.0)
MCHC: 32.3 g/dL (ref 31.5–35.7)
MCV: 99 fL — ABNORMAL HIGH (ref 79–97)
Platelets: 128 x10E3/uL — ABNORMAL LOW (ref 150–450)
RBC: 3.16 x10E6/uL — ABNORMAL LOW (ref 4.14–5.80)
RDW: 13.6 % (ref 11.6–15.4)
WBC: 4.7 x10E3/uL (ref 3.4–10.8)

## 2023-11-11 MED ORDER — SODIUM CHLORIDE 0.9 % IV SOLN: INTRAVENOUS | Status: DC

## 2023-11-12 ENCOUNTER — Ambulatory Visit
Admission: RE | Admit: 2023-11-12 | Discharge: 2023-11-12 | Disposition: A | Discharge status: Home / Self Care | Attending: Cardiovascular Disease | Admitting: Cardiovascular Disease

## 2023-11-12 ENCOUNTER — Ambulatory Visit: Payer: Self-pay | Admitting: Anesthesiology

## 2023-11-12 ENCOUNTER — Encounter
Admission: RE | Disposition: A | Discharge status: Home / Self Care | Payer: Self-pay | Source: Home / Self Care | Attending: Cardiovascular Disease

## 2023-11-12 ENCOUNTER — Encounter: Payer: Self-pay | Admitting: Cardiovascular Disease

## 2023-11-12 DIAGNOSIS — Z992 Dependence on renal dialysis: Secondary | ICD-10-CM | POA: Diagnosis not present

## 2023-11-12 DIAGNOSIS — I4892 Unspecified atrial flutter: Secondary | ICD-10-CM | POA: Diagnosis not present

## 2023-11-12 DIAGNOSIS — Z79899 Other long term (current) drug therapy: Secondary | ICD-10-CM | POA: Diagnosis not present

## 2023-11-12 DIAGNOSIS — Z7901 Long term (current) use of anticoagulants: Secondary | ICD-10-CM | POA: Insufficient documentation

## 2023-11-12 DIAGNOSIS — I120 Hypertensive chronic kidney disease with stage 5 chronic kidney disease or end stage renal disease: Secondary | ICD-10-CM | POA: Diagnosis not present

## 2023-11-12 DIAGNOSIS — Z01818 Encounter for other preprocedural examination: Secondary | ICD-10-CM

## 2023-11-12 DIAGNOSIS — N186 End stage renal disease: Secondary | ICD-10-CM | POA: Insufficient documentation

## 2023-11-12 DIAGNOSIS — Z87891 Personal history of nicotine dependence: Secondary | ICD-10-CM | POA: Insufficient documentation

## 2023-11-12 DIAGNOSIS — I4891 Unspecified atrial fibrillation: Secondary | ICD-10-CM | POA: Diagnosis present

## 2023-11-12 DIAGNOSIS — I483 Typical atrial flutter: Secondary | ICD-10-CM

## 2023-11-12 DIAGNOSIS — I4819 Other persistent atrial fibrillation: Secondary | ICD-10-CM | POA: Diagnosis not present

## 2023-11-12 DIAGNOSIS — I2510 Atherosclerotic heart disease of native coronary artery without angina pectoris: Secondary | ICD-10-CM | POA: Insufficient documentation

## 2023-11-12 DIAGNOSIS — I4510 Unspecified right bundle-branch block: Secondary | ICD-10-CM | POA: Diagnosis not present

## 2023-11-12 DIAGNOSIS — R0602 Shortness of breath: Secondary | ICD-10-CM

## 2023-11-12 HISTORY — PX: CARDIOVERSION: SHX1299

## 2023-11-12 LAB — POTASSIUM (ARMC VASCULAR LAB ONLY): Potassium (ARMC vascular lab): 4.1 mmol/L (ref 3.5–5.1)

## 2023-11-12 SURGERY — CARDIOVERSION
Anesthesia: General

## 2023-11-12 MED ORDER — LIDOCAINE HCL (PF) 2 % IJ SOLN
INTRAMUSCULAR | Status: AC
  Filled 2023-11-12: qty 5

## 2023-11-12 MED ORDER — EPHEDRINE 5 MG/ML INJ
INTRAVENOUS | Status: AC
  Filled 2023-11-12: qty 5

## 2023-11-12 MED ORDER — PHENYLEPHRINE 80 MCG/ML (10ML) SYRINGE FOR IV PUSH (FOR BLOOD PRESSURE SUPPORT)
PREFILLED_SYRINGE | INTRAVENOUS | Status: AC
  Filled 2023-11-12: qty 10

## 2023-11-12 MED ORDER — LIDOCAINE HCL (CARDIAC) PF 100 MG/5ML IV SOSY
PREFILLED_SYRINGE | INTRAVENOUS | Status: DC | PRN
Start: 2023-11-12 — End: 2023-11-12
  Administered 2023-11-12: 100 mg via INTRAVENOUS

## 2023-11-12 MED ORDER — PROPOFOL 10 MG/ML IV BOLUS
INTRAVENOUS | Status: AC
  Filled 2023-11-12: qty 20

## 2023-11-12 MED ORDER — PROPOFOL 10 MG/ML IV BOLUS
INTRAVENOUS | Status: DC | PRN
  Administered 2023-11-12: 50 mg via INTRAVENOUS

## 2023-11-12 NOTE — Interval H&P Note (Signed)
 History and Physical Interval Note:  11/12/2023 6:15 PM  Terry HERO Febus Sr.  has presented today for surgery, with the diagnosis of Cardioversion  Afib.  The various methods of treatment have been discussed with the patient and family. After consideration of risks, benefits and other options for treatment, the patient has consented to  Procedure(s): CARDIOVERSION (N/A) as a surgical intervention.  The patient's history has been reviewed, patient examined, no change in status, stable for surgery.  I have reviewed the patient's chart and labs.  Questions were answered to the patient's satisfaction.     Clell Trahan

## 2023-11-12 NOTE — Anesthesia Postprocedure Evaluation (Signed)
 Anesthesia Post Note  Patient: Terry Stephens.  Procedure(s) Performed: CARDIOVERSION  Patient location during evaluation: Specials Recovery Anesthesia Type: General Level of consciousness: awake and alert Pain management: pain level controlled Vital Signs Assessment: post-procedure vital signs reviewed and stable Respiratory status: spontaneous breathing, nonlabored ventilation, respiratory function stable and patient connected to nasal cannula oxygen Cardiovascular status: blood pressure returned to baseline and stable Postop Assessment: no apparent nausea or vomiting Anesthetic complications: no   No notable events documented.   Last Vitals:  Vitals:   11/12/23 0815 11/12/23 0830  BP: (!) 103/53 (!) 105/57  Pulse: 68 84  Resp:  (!) 21  Temp:    SpO2: 97% 98%    Last Pain:  Vitals:   11/12/23 0830  TempSrc:   PainSc: 0-No pain                 Fairy POUR Jalyn Dutta

## 2023-11-12 NOTE — Anesthesia Preprocedure Evaluation (Signed)
 Anesthesia Evaluation  Patient identified by MRN, date of birth, ID band Patient awake    Reviewed: Allergy & Precautions, NPO status , Patient's Chart, lab work & pertinent test results  History of Anesthesia Complications Negative for: history of anesthetic complications  Airway Mallampati: III  TM Distance: <3 FB Neck ROM: full    Dental  (+) Chipped   Pulmonary neg shortness of breath, former smoker   Pulmonary exam normal        Cardiovascular Exercise Tolerance: Good hypertension, (-) angina + CAD  + dysrhythmias Atrial Fibrillation  Rhythm:irregular Rate:Normal     Neuro/Psych negative neurological ROS  negative psych ROS   GI/Hepatic negative GI ROS, Neg liver ROS,,,  Endo/Other  negative endocrine ROS    Renal/GU DialysisRenal disease  negative genitourinary   Musculoskeletal   Abdominal   Peds  Hematology negative hematology ROS (+)   Anesthesia Other Findings Past Medical History: No date: A-fib (HCC) No date: Allergic rhinitis No date: CAD (coronary artery disease) No date: Chronic kidney disease No date: Hypertension  Past Surgical History: 02/21/2023: A/V FISTULAGRAM; Left     Comment:  Procedure: A/V Fistulagram;  Surgeon: Jama Cordella MATSU, MD;  Location: [facility] INVASIVE CV LAB;  Service:               Cardiovascular;  Laterality: Left; No date: AORTIC VALVE REPAIR 02/2022: AV FISTULA PLACEMENT; Left 12/11/2022: BIOPSY     Comment:  Procedure: BIOPSY;  Surgeon: Aundria, Ladell POUR, MD;                Location: [facility] ENDOSCOPY;  Service: Gastroenterology;; 12/13/2022: COLONOSCOPY; N/A     Comment:  Procedure: COLONOSCOPY;  Surgeon: Toledo, Ladell POUR, MD;              Location: [facility] ENDOSCOPY;  Service: Gastroenterology;                Laterality: N/A; 12/11/2022: COLONOSCOPY WITH PROPOFOL ; N/A     Comment:  Procedure: COLONOSCOPY WITH PROPOFOL ;  Surgeon: Toledo,                Ladell POUR, MD;  Location: [facility] ENDOSCOPY;  Service:               Gastroenterology;  Laterality: N/A; No date: CORONARY ARTERY BYPASS GRAFT 12/11/2022: ESOPHAGOGASTRODUODENOSCOPY (EGD) WITH PROPOFOL ; N/A     Comment:  Procedure: ESOPHAGOGASTRODUODENOSCOPY (EGD) WITH               PROPOFOL ;  Surgeon: Toledo, Ladell POUR, MD;  Location:               [facility] ENDOSCOPY;  Service: Gastroenterology;  Laterality:               N/A; 12/11/2022: HEMOSTASIS CLIP PLACEMENT     Comment:  Procedure: HEMOSTASIS CLIP PLACEMENT;  Surgeon: Aundria,               Ladell POUR, MD;  Location: [facility] ENDOSCOPY;  Service:               Gastroenterology;; 12/11/2022: POLYPECTOMY     Comment:  Procedure: POLYPECTOMY;  Surgeon: Toledo, Ladell POUR, MD;              Location: [facility] ENDOSCOPY;  Service: Gastroenterology;;  BMI    Body Mass Index: 23.78 kg/m      Reproductive/Obstetrics negative OB ROS  Anesthesia Physical Anesthesia Plan  ASA: 4  Anesthesia Plan: General   Post-op Pain Management:    Induction: Intravenous  PONV Risk Score and Plan: Propofol  infusion and TIVA  Airway Management Planned: Natural Airway and Nasal Cannula  Additional Equipment:   Intra-op Plan:   Post-operative Plan:   Informed Consent: I have reviewed the patients History and Physical, chart, labs and discussed the procedure including the risks, benefits and alternatives for the proposed anesthesia with the patient or authorized representative who has indicated his/her understanding and acceptance.     Dental Advisory Given  Plan Discussed with: Anesthesiologist, CRNA and Surgeon  Anesthesia Plan Comments: (Patient consented for risks of anesthesia including but not limited to:  - adverse reactions to medications - risk of airway placement if required - damage to eyes, teeth, lips or other oral mucosa - nerve damage due to positioning  - sore throat or hoarseness -  Damage to heart, brain, nerves, lungs, other parts of body or loss of life  Patient voiced understanding and assent.)        Anesthesia Quick Evaluation

## 2023-11-12 NOTE — Transfer of Care (Signed)
 Immediate Anesthesia Transfer of Care Note  Patient: Terry HERO Los Sr.  Procedure(s) Performed: CARDIOVERSION  Patient Location: PACU  Anesthesia Type:MAC  Level of Consciousness: awake, alert , and oriented  Airway & Oxygen Therapy: Patient Spontanous Breathing and Patient connected to nasal cannula oxygen  Post-op Assessment: Report given to RN and Post -op Vital signs reviewed and stable  Post vital signs: stable  Last Vitals:  Vitals Value Taken Time  BP 100/46 11/12/23 07:55  Temp    Pulse 64 11/12/23 07:56  Resp 17 11/12/23 07:56  SpO2 98 % 11/12/23 07:56    Last Pain:  Vitals:   11/12/23 0657  TempSrc: Temporal  PainSc: 0-No pain         Complications: No notable events documented.

## 2023-11-12 NOTE — H&P (Signed)

## 2023-11-12 NOTE — CV Procedure (Signed)
 Cardioversion procedure note For atrial flutter, typical  Procedure Details:  Consent: Risks of procedure as well as the alternatives and risks of each were explained to the (patient/caregiver).  Consent for procedure obtained.  Time Out: Verified patient identification, verified procedure, site/side was marked, verified correct patient position, special equipment/implants available, medications/allergies/relevent history reviewed, required imaging and test results available.  Performed  Patient placed on cardiac monitor, pulse oximetry, supplemental oxygen as necessary.   Sedation given: propofol  IV, Dr. Stevan Pacer pads placed anterior and posterior chest.   Cardioverted 1 time(s).   Cardioverted at 150  J. Synchronized biphasic Converted to atrial fibrillation, then converting to NSR   Evaluation: Findings: Post procedure EKG shows: Atrial fibrillation with conversion to NSR Complications: None Patient did tolerate procedure well.  Time Spent Directly with the Patient:  45 minutes   Tim Liani Caris, M.D., Ph.D.

## 2023-11-13 ENCOUNTER — Other Ambulatory Visit (INDEPENDENT_AMBULATORY_CARE_PROVIDER_SITE_OTHER): Payer: Self-pay | Admitting: Vascular Surgery

## 2023-11-13 ENCOUNTER — Encounter (INDEPENDENT_AMBULATORY_CARE_PROVIDER_SITE_OTHER): Payer: Self-pay | Admitting: Vascular Surgery

## 2023-11-13 ENCOUNTER — Encounter: Payer: Self-pay | Admitting: Cardiovascular Disease

## 2023-11-13 DIAGNOSIS — N186 End stage renal disease: Secondary | ICD-10-CM

## 2023-11-17 ENCOUNTER — Ambulatory Visit (INDEPENDENT_AMBULATORY_CARE_PROVIDER_SITE_OTHER)

## 2023-11-17 ENCOUNTER — Ambulatory Visit (INDEPENDENT_AMBULATORY_CARE_PROVIDER_SITE_OTHER): Admitting: Vascular Surgery

## 2023-11-17 ENCOUNTER — Encounter (INDEPENDENT_AMBULATORY_CARE_PROVIDER_SITE_OTHER): Payer: Self-pay | Admitting: Vascular Surgery

## 2023-11-17 VITALS — BP 118/67 | HR 71 | Resp 18 | Wt 166.8 lb

## 2023-11-17 DIAGNOSIS — I10 Essential (primary) hypertension: Secondary | ICD-10-CM | POA: Diagnosis not present

## 2023-11-17 DIAGNOSIS — I4891 Unspecified atrial fibrillation: Secondary | ICD-10-CM | POA: Diagnosis not present

## 2023-11-17 DIAGNOSIS — I25118 Atherosclerotic heart disease of native coronary artery with other forms of angina pectoris: Secondary | ICD-10-CM | POA: Diagnosis not present

## 2023-11-17 DIAGNOSIS — N186 End stage renal disease: Secondary | ICD-10-CM

## 2023-11-17 NOTE — Progress Notes (Signed)
 MRN : [id number]  Terry Shappell Cange Sr. is a 83 y.o. (1940-03-01) male who presents with chief complaint of check access.  History of Present Illness:   The patient returns to the office for followup status post intervention of their dialysis access on 02/21/2023.    Procedure: Percutaneous transluminal angioplasty and stent placement cephalic vein at the cephalic subclavian confluence, peripheral segment   Following the intervention the access function has significantly improved, with better flow rates and improved KT/V. The patient has not been experiencing increased bleeding times following decannulation and the patient denies increased recirculation. The patient denies an increase in arm swelling.  However he did have an episode of infiltration about 2 hours in to his last dialysis run.  He does have some bruising and has been using ice.  At the present time the patient denies hand pain.   Duplex ultrasound of the AV access shows a patent access.  The previously noted stenosis is improved compared to last study.  Flow volume today is 2447 cc/min (previous flow volume was 2203 cc/min)  Current Meds  Medication Sig   acetaminophen  (TYLENOL ) 500 MG tablet Take 1,000 mg by mouth every 6 (six) hours as needed for moderate pain (pain score 4-6).   amiodarone  (PACERONE ) 200 MG tablet Take 1 tablet (200 mg total) by mouth daily.   atorvastatin  (LIPITOR) 20 MG tablet TAKE 1 TABLET(20 MG) BY MOUTH DAILY   B Complex-C-Folic Acid  (RENAL VITAMIN) 0.8 MG TABS Take 1 tablet by mouth daily.   diclofenac Sodium (VOLTAREN) 1 % GEL Apply 1 Application topically in the morning and at bedtime.   ELIQUIS  2.5 MG TABS tablet TAKE 1 TABLET(2.5 MG) BY MOUTH TWICE DAILY   fluticasone (FLONASE) 50 MCG/ACT nasal spray Place 2 sprays into both nostrils at bedtime.   furosemide  (LASIX ) 40 MG tablet Take 1 tablet (40 mg total) by mouth 4 (four) times a week. On NON DIALYSIS DAYS    lidocaine -prilocaine (EMLA) cream Apply 1 Application topically Every Tuesday,Thursday,and Saturday with dialysis.   metoprolol  tartrate (LOPRESSOR ) 25 MG tablet Take 0.5 tablets (12.5 mg total) by mouth 2 (two) times daily. Hold if SBP <110 mmHg and or HR <60 on NON DIALYSIS DAYS   midodrine  (PROAMATINE ) 10 MG tablet Take 1 tablet (10 mg total) by mouth 3 (three) times daily as needed (low blood pressure at dialysis). On dialysis days Tues, Thurs,Sat   omeprazole (PRILOSEC) 20 MG capsule Take 20 mg by mouth daily. (Patient taking differently: Take 20 mg by mouth daily as needed (acid reflux).)   psyllium (METAMUCIL) 0.52 g capsule Take 0.52 g by mouth daily.   Saw Palmetto 450 MG CAPS Take 450 mg by mouth daily.   tacrolimus (PROTOPIC) 0.1 % ointment Apply 1 Application topically daily as needed (dermatitis).   tamsulosin  (FLOMAX ) 0.4 MG CAPS capsule Take 0.4 mg by mouth daily after supper.    Past Medical History:  Diagnosis Date   A-fib [facility])    Allergic rhinitis    CAD (coronary artery disease)    Chronic kidney disease    Hypertension     Past Surgical History:  Procedure Laterality Date   A/V FISTULAGRAM Left 02/21/2023   Procedure: A/V Fistulagram;  Surgeon: Jama Cordella MATSU, MD;  Location: [facility] INVASIVE CV LAB;  Service: Cardiovascular;  Laterality: Left;   AORTIC VALVE REPAIR     AV FISTULA  PLACEMENT Left 02/2022   BIOPSY  12/11/2022   Procedure: BIOPSY;  Surgeon: Aundria, Ladell POUR, MD;  Location: [facility] ENDOSCOPY;  Service: Gastroenterology;;   CARDIOVERSION N/A 11/12/2023   Procedure: CARDIOVERSION;  Surgeon: Perla Evalene PARAS, MD;  Location: [facility] ORS;  Service: Cardiovascular;  Laterality: N/A;   COLONOSCOPY N/A 12/13/2022   Procedure: COLONOSCOPY;  Surgeon: Toledo, Ladell POUR, MD;  Location: [facility] ENDOSCOPY;  Service: Gastroenterology;  Laterality: N/A;   COLONOSCOPY WITH PROPOFOL  N/A 12/11/2022   Procedure: COLONOSCOPY WITH PROPOFOL ;  Surgeon: Toledo, Ladell POUR, MD;  Location:  [facility] ENDOSCOPY;  Service: Gastroenterology;  Laterality: N/A;   CORONARY ARTERY BYPASS GRAFT     ESOPHAGOGASTRODUODENOSCOPY (EGD) WITH PROPOFOL  N/A 12/11/2022   Procedure: ESOPHAGOGASTRODUODENOSCOPY (EGD) WITH PROPOFOL ;  Surgeon: Toledo, Ladell POUR, MD;  Location: [facility] ENDOSCOPY;  Service: Gastroenterology;  Laterality: N/A;   HEMOSTASIS CLIP PLACEMENT  12/11/2022   Procedure: HEMOSTASIS CLIP PLACEMENT;  Surgeon: Aundria, Ladell POUR, MD;  Location: [facility] ENDOSCOPY;  Service: Gastroenterology;;   POLYPECTOMY  12/11/2022   Procedure: POLYPECTOMY;  Surgeon: Toledo, Ladell POUR, MD;  Location: [facility] ENDOSCOPY;  Service: Gastroenterology;;    Social History Social History   Tobacco Use   Smoking status: Former   Smokeless tobacco: Never  Vaping Use   Vaping status: Never Used  Substance Use Topics   Alcohol use: No   Drug use: No    Family History Family History  Problem Relation Age of Onset   Aneurysm Mother    Heart attack Father    Heart attack Sister    Heart attack Brother     Allergies  Allergen Reactions   Phenothiazines Other (See Comments)    Extra-pyramidal reaction.      Prochlorperazine Other (See Comments)    prochlorperazine   Prochlorperazine Edisylate Other (See Comments)    Extra-pyramidal   Tape Rash   Wound Dressing Adhesive Dermatitis     REVIEW OF SYSTEMS (Negative unless checked)  Constitutional: [] Weight loss  [] Fever  [] Chills Cardiac: [] Chest pain   [] Chest pressure   [] Palpitations   [] Shortness of breath when laying flat   [] Shortness of breath with exertion. Vascular:  [] Pain in legs with walking   [] Pain in legs at rest  [] History of DVT   [] Phlebitis   [] Swelling in legs   [] Varicose veins   [] Non-healing ulcers Pulmonary:   [] Uses home oxygen   [] Productive cough   [] Hemoptysis   [] Wheeze  [] COPD   [] Asthma Neurologic:  [] Dizziness   [] Seizures   [] History of stroke   [] History of TIA  [] Aphasia   [] Vissual changes   [] Weakness or numbness in arm    [] Weakness or numbness in leg Musculoskeletal:   [] Joint swelling   [] Joint pain   [] Low back pain Hematologic:  [] Easy bruising  [] Easy bleeding   [] Hypercoagulable state   [] Anemic Gastrointestinal:  [] Diarrhea   [] Vomiting  [] Gastroesophageal reflux/heartburn   [] Difficulty swallowing. Genitourinary:  [x] Chronic kidney disease   [] Difficult urination  [] Frequent urination   [] Blood in urine Skin:  [] Rashes   [] Ulcers  Psychological:  [] History of anxiety   []  History of major depression.  Physical Examination  Vitals:   11/17/23 1521  BP: 118/67  Pulse: 71  Resp: 18  Weight: 166 lb 12.8 oz (75.7 kg)   Body mass index is 24.63 kg/m. Gen: WD/WN, NAD Head: [place]/AT, No temporalis wasting.  Ear/Nose/Throat: Hearing grossly intact, nares w/o erythema or drainage Eyes: PER, EOMI, sclera nonicteric.  Neck: Supple, no gross masses or  lesions.  No JVD.  Pulmonary:  Good air movement, no audible wheezing, no use of accessory muscles.  Cardiac: RRR, precordium non-hyperdynamic. Vascular:   Arm access good thrill good bruit skin appears healthy and intact Vessel Right Left  Radial Palpable Palpable  Brachial Palpable Palpable  Gastrointestinal: soft, non-distended. No guarding/no peritoneal signs.  Musculoskeletal: M/S 5/5 throughout.  No deformity.  Neurologic: CN 2-12 intact. Pain and light touch intact in extremities.  Symmetrical.  Speech is fluent. Motor exam as listed above. Psychiatric: Judgment intact, Mood & affect appropriate for pt's clinical situation. Dermatologic: No rashes or ulcers noted.  No changes consistent with cellulitis.   CBC Lab Results  Component Value Date   WBC 4.7 11/05/2023   HGB 10.1 (L) 11/05/2023   HCT 31.3 (L) 11/05/2023   MCV 99 (H) 11/05/2023   PLT 128 (L) 11/05/2023    BMET    Component Value Date/Time   NA 144 11/05/2023 1107   K 4.2 11/05/2023 1107   CL 98 11/05/2023 1107   CO2 26 11/05/2023 1107   GLUCOSE 90 11/05/2023 1107   GLUCOSE  83 12/20/2022 0405   BUN 46 (H) 11/05/2023 1107   CREATININE 4.69 (H) 11/05/2023 1107   CALCIUM  8.7 11/05/2023 1107   GFRNONAA 13 (L) 12/20/2022 0405   GFRAA (L) 07/01/2006 1132    51        The eGFR has been calculated using the MDRD equation. This calculation has not been validated in all clinical   Estimated Creatinine Clearance: 11.7 mL/min (A) (by C-G formula based on SCr of 4.69 mg/dL (H)).  COAG Lab Results  Component Value Date   INR 1.2 12/17/2022   INR 1.4 (H) 12/14/2022   INR 1.3 (H) 12/12/2022    Radiology No results found.   Assessment/Plan 1. End stage renal disease (HCC) (Primary) Recommend:   The patient is doing well and currently has adequate dialysis access. The patient's dialysis center is not reporting any access issues. Flow pattern is stable when compared to the prior ultrasound.   The patient should have a duplex ultrasound of the dialysis access in 6 months. The patient will follow-up with me in the office after each ultrasound  - VAS US  DUPLEX DIALYSIS ACCESS (AVF, AVG); Future  2. Atrial fibrillation, unspecified type (HCC) Continue antiarrhythmia medications as already ordered, these medications have been reviewed and there are no changes at this time.  Continue anticoagulation as ordered by Cardiology Service  3. Primary hypertension Continue antihypertensive medications as already ordered, these medications have been reviewed and there are no changes at this time.  4. Coronary artery disease of native artery of native heart with stable angina pectoris Continue cardiac and antihypertensive medications as already ordered and reviewed, no changes at this time.  Continue statin as ordered and reviewed, no changes at this time  Nitrates PRN for chest pain    Cordella Shawl, MD  11/17/2023 3:49 PM

## 2023-12-02 NOTE — Progress Notes (Unsigned)
 Electrophysiology Clinic Note    Date:  12/03/2023  Patient ID:  Terry DUCKSWORTH Sr., DOB 1940/07/16, MRN [id number] PCP:  Lenon Layman ORN, MD  Cardiologist:  Evalene Lunger, MD  Electrophysiologist:  OLE ONEIDA HOLTS, MD  Electrophysiology APP:  Amador Braddy, NP     Discussed the use of AI scribe software for clinical note transcription with the patient, who gave verbal consent to proceed.   Patient Profile    Chief Complaint: DCCV follow-up  History of Present Illness: [name] is a 83 y.o. male with PMH notable for persis AFib, aflutter, CAD s/p CABG, AI s/p AVR, ESRD on HD, hypotension; seen today for OLE ONEIDA HOLTS, MD for routine electrophysiology followup.   He saw Dr. Holts 08/2023 for initial EP evaluation of AFib, not invasive EP procedural candidate. Patient did not want to initiate amiodarone  at that appointment. He saw Dr. Gollan in follow up where amiodarone  was initiated.  He last saw Dr. Holts 10/2023 where he remained in AFib and was set up for DCCV that was completed 11/5.   On follow-up today, he is not surprised to hear that he has reverted back to atrial flutter.  He is unsure of when he converted, or if he is having periods of sinus rhythm.  He denies shortness of breath, dizziness, palpitations or any other clear symptoms of AFib, though does feel a brief intermittent hiccup like sensation.  He does endorse having intermittent thrill in his L arm fistula when laying on left side.  He previously took metoprolol  for HR control, but felt poorly while taking it likely d/t hypotension. He feels better since being on amiodarone .   He continues to have hypotension during HD, sometimes needing multiple doses of midodrine  to complete a session.   Continues to take eliquis  BID, no bleeding concerns.     Arrhythmia/Device History Amiodarone     ROS:  Please see the history of present illness. All other systems are reviewed and  otherwise negative.    Physical Exam    VS:  BP 110/64   Pulse (!) 101   Ht 5' 9 (1.753 m)   Wt 166 lb 3.2 oz (75.4 kg)   SpO2 97%   BMI 24.54 kg/m  BMI: Body mass index is 24.54 kg/m.           Wt Readings from Last 3 Encounters:  12/03/23 166 lb 3.2 oz (75.4 kg)  11/17/23 166 lb 12.8 oz (75.7 kg)  11/12/23 161 lb (73 kg)     GEN- The patient is well appearing, alert and oriented x 3 today.   Lungs- Clear to ausculation bilaterally, normal work of breathing.  Heart- Irregularly irregular rate and rhythm, no murmurs, rubs or gallops Extremities- No peripheral edema, warm, dry   Studies Reviewed   Previous EP, cardiology notes.    EKG is ordered. Personal review of EKG from today shows:    EKG Interpretation Date/Time:  Wednesday December 03 2023 10:01:17 EST Ventricular Rate:  101 PR Interval:  144 QRS Duration:  180 QT Interval:  430 QTC Calculation: 557 R Axis:   71  Text Interpretation: atrial flutter with variable conduction  Right bundle branch block Confirmed by Terry Stephens [phone]) on 12/03/2023 10:10:52 AM    Long term monitor, 08/08/2023 Patch Wear Time:  13 days and 22 hours (2025-07-11T20:30:23-0400 to 2025-07-25T19:26:46-0400)   Normal sinus rhythm with atrial flutter Patient had a min HR of 54 bpm, max HR of 168  bpm, and avg HR of 88 bpm.  Atrial Flutter occurred (42% burden), ranging from 66-168  bpm (avg of 101 bpm), the longest lasting 8 hours 44 mins with an avg rate of 94 bpm.    Bundle Branch Block/IVCD was present.    Isolated SVEs were rare (<1.0%), SVE Couplets were rare (<1.0%), and SVE Triplets were rare (<1.0%).    Isolated VEs were frequent (5.9%, [id number]), VE Couplets were occasional (1.3%, 11519), and VE Triplets were rare (<1.0%, 9). Ventricular Bigeminy and Trigeminy were present.   No patient triggered events recorded  TTE, 12/14/2022  1. Left ventricular ejection fraction, by estimation, is 50 to 55%. The left ventricle  has low normal function. The left ventricle demonstrates regional wall motion abnormalities (see scoring diagram/findings for description). There is mild left ventricular hypertrophy. Left ventricular diastolic parameters are consistent with Grade II diastolic dysfunction (pseudonormalization). There is hypokinesis of the left ventricular, mid-apical inferoseptal  wall, inferior wall and apical segment.   2. Right ventricular systolic function is normal. The right ventricular size is moderately enlarged. There is severely elevated pulmonary artery systolic pressure. The estimated right ventricular systolic pressure is 64.3 mmHg.   3. Left atrial size was moderately dilated.   4. Right atrial size was severely dilated.   5. The mitral valve is degenerative. Mild mitral valve regurgitation. Mild mitral stenosis.   6. Tricuspid valve regurgitation is severe.   7. The aortic valve has been repaired/replaced. There is moderate calcification of the aortic valve. There is moderate thickening of the aortic valve. Aortic valve regurgitation is trivial. 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 Procedure Date: 2012.   8. 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   9. The inferior vena cava is dilated in size with <50% respiratory variability, suggesting right atrial pressure of 15 mmHg.   Comparison(s): Prior images unable to be directly viewed, comparison made by report only. Echo from [facility] 03/02/22 noted EF 40%, prior echo 2023 with regional wall motion abnormalities in the septum, apex, and inferior walls, moderate RV enlargement with  mildly decreased function, bioprosthetic aortic valve.   Conclusion(s)/Recommendation(s): LV appears similar to description from [facility] echo in 2024. There is severe TR, severely enlarged RA, and severe pulmonary hypertension. Pulmonary hypertension is worse compared to prior report.    Assessment and Plan      #) persis Afib #) aflutter #) amiodarone  monitoring Not invasive EP procedural candidate S/p DCCV 11/5 with recurrence of Aflutter He is relatively asymptomatic except for intermittent thrill in fistula when laying on L side.  He does feel better since initiating amiodarone  compared to metoprolol  Unclear whether he is having paroxysms of flutter.  Will update 2 week zio to further eval Update LFT, thyroid  labs today Continue 200mg  amiodarone     #) Hypercoag d/t persis afib CHA2DS2-VASc Score = at least 4 [CHF History: 0, HTN History: 1, Diabetes History: 0, Stroke History: 0, Vascular Disease History: 1, Age Score: 2, Gender Score: 0].  Therefore, the patient's annual risk of stroke is 4.8 %.    Stroke ppx - 2.5mg  eliquis , dose appropriately reduced for age, Cr No bleeding concerns        Current medicines are reviewed at length with the patient today.   The patient does not have concerns regarding his medicines.  The following changes were made today:  none  Labs/ tests ordered today include:  Orders Placed This Encounter  Procedures   Hepatic  function panel   TSH   T4, free   LONG TERM MONITOR (3-14 DAYS)   EKG 12-Lead     Disposition: Follow up with EP Team or EP APP in 3 months   Signed, Shacola Schussler, NP  12/03/23  1:15 PM  Electrophysiology [facility]

## 2023-12-03 ENCOUNTER — Ambulatory Visit

## 2023-12-03 ENCOUNTER — Ambulatory Visit: Attending: Cardiology | Admitting: Cardiology

## 2023-12-03 ENCOUNTER — Encounter: Payer: Self-pay | Admitting: Cardiology

## 2023-12-03 VITALS — BP 110/64 | HR 101 | Ht 69.0 in | Wt 166.2 lb

## 2023-12-03 DIAGNOSIS — I4892 Unspecified atrial flutter: Secondary | ICD-10-CM

## 2023-12-03 DIAGNOSIS — I4819 Other persistent atrial fibrillation: Secondary | ICD-10-CM

## 2023-12-03 DIAGNOSIS — D6869 Other thrombophilia: Secondary | ICD-10-CM

## 2023-12-03 DIAGNOSIS — Z5181 Encounter for therapeutic drug level monitoring: Secondary | ICD-10-CM

## 2023-12-03 DIAGNOSIS — Z79899 Other long term (current) drug therapy: Secondary | ICD-10-CM

## 2023-12-03 NOTE — Patient Instructions (Signed)
 Medication Instructions:  Your physician recommends that you continue on your current medications as directed. Please refer to the Current Medication list given to you today.  *If you need a refill on your cardiac medications before your next appointment, please call your pharmacy*  Lab Work: Your provider would like for you to have following labs drawn today Liver function, TSH and T4.     Testing/Procedures: GEOFFRY HEWS- Long Term Monitor Instructions  Your physician has requested you wear a ZIO patch monitor for 14 days.  This is a single patch monitor. [employer] supplies one patch monitor per enrollment. Additional stickers are not available. Please do not apply patch if you will be having a Nuclear Stress Test, Echocardiogram, Cardiac CT, MRI, or Chest Xray during the period you would be wearing the monitor. The patch cannot be worn during these tests. You cannot remove and re-apply the ZIO XT patch monitor.  Your ZIO patch monitor will be mailed 3 day USPS to your address on file. It may take 3-5 days to receive your monitor after you have been enrolled. Once you have received your monitor, please review the enclosed instructions. Your monitor has already been registered assigning a specific monitor serial number to you.  Billing and Patient Assistance Program Information  We have supplied [employer] with any of your insurance information on file for billing purposes.  [employer] offers a sliding scale Patient Assistance Program for patients that do not have insurance, or whose insurance does not completely cover the cost of the ZIO monitor.  You must apply for the Patient Assistance Program to qualify for this discounted rate.  To apply, please call [employer] at [phone], select option 4, select option 2, ask to apply for Patient Assistance Program. Meredeth will ask your household income, and how many people are in your household. They will quote your out-of-pocket cost based on that information.  [employer] will also be able to set up a 65-month, interest-free payment plan if needed.  Applying the monitor   Shave hair from upper left chest.  Hold abrader disc by orange tab. Rub abrader in 40 strokes over the upper left chest as indicated in your monitor instructions.  Clean area with 4 enclosed alcohol pads. Let dry.  Apply patch as indicated in monitor instructions. Patch will be placed under collarbone on left side of chest with arrow pointing upward.  Rub patch adhesive wings for 2 minutes. Remove white label marked 1. Remove the white label marked 2. Rub patch adhesive wings for 2 additional minutes.  While looking in a mirror, press and release button in center of patch. A small green light will flash 3-4 times. This will be your only indicator that the monitor has been turned on.  Do not shower for the first 24 hours. You may shower after the first 24 hours.  Press the button if you feel a symptom. You will hear a small click. Record Date, Time and Symptom in the Patient Logbook.  When you are ready to remove the patch, follow instructions on the last 2 pages of Patient Logbook.  Stick patch monitor into the tabs at the bottom of the return box.  Place Patient Logbook in the blue and white box. Use locking tab on box and tape box closed securely. The blue and white box has prepaid postage on it. Please place it in the mailbox as soon as possible. Your physician should have your test results approximately 7-14 days after the monitor has been mailed back  to [employer].  Call [facility] Customer Care at [phone] if you have questions regarding your ZIO XT patch monitor.  Call them immediately if you see an orange light blinking on your monitor.  If your monitor falls off in less than 4 days, contact our Monitor department at [phone].  If your monitor becomes loose or falls off after 4 days call [employer] at [phone] for suggestions on securing your monitor.    Follow-Up: At [facility], you and your health needs are our priority.  As part of our continuing mission to provide you with exceptional heart care, our providers are all part of one team.  This team includes your primary Cardiologist (physician) and Advanced Practice Providers or APPs (Physician Assistants and Nurse Practitioners) who all work together to provide you with the care you need, when you need it.  Your next appointment:   3 month(s)  Provider:   Suzann Riddle, NP

## 2023-12-04 ENCOUNTER — Ambulatory Visit: Payer: Self-pay | Admitting: Cardiology

## 2023-12-04 LAB — T4, FREE: Free T4: 1.28 ng/dL (ref 0.82–1.77)

## 2023-12-04 LAB — HEPATIC FUNCTION PANEL
ALT: 34 IU/L (ref 0–44)
AST: 25 IU/L (ref 0–40)
Albumin: 4.3 g/dL (ref 3.7–4.7)
Alkaline Phosphatase: 76 IU/L (ref 48–129)
Bilirubin Total: 0.4 mg/dL (ref 0.0–1.2)
Bilirubin, Direct: 0.18 mg/dL (ref 0.00–0.40)
Total Protein: 5.9 g/dL — ABNORMAL LOW (ref 6.0–8.5)

## 2023-12-04 LAB — TSH: TSH: 2.08 u[IU]/mL (ref 0.450–4.500)

## 2023-12-11 ENCOUNTER — Other Ambulatory Visit

## 2023-12-15 ENCOUNTER — Ambulatory Visit: Attending: Cardiovascular Disease

## 2023-12-15 DIAGNOSIS — Z952 Presence of prosthetic heart valve: Secondary | ICD-10-CM

## 2023-12-15 LAB — ECHOCARDIOGRAM COMPLETE
AR max vel: 1.33 cm2
AV Area VTI: 1.39 cm2
AV Area mean vel: 1.31 cm2
AV Mean grad: 19 mmHg
AV Peak grad: 34.8 mmHg
Ao pk vel: 2.95 m/s
S' Lateral: 3.27 cm

## 2023-12-17 ENCOUNTER — Ambulatory Visit: Payer: Self-pay | Admitting: Cardiovascular Disease

## 2023-12-18 ENCOUNTER — Encounter: Payer: Self-pay | Admitting: Emergency Medicine

## 2023-12-23 NOTE — Progress Notes (Unsigned)
 Cardiology Office Note  Date:  12/26/2023   ID:  Terry Stephens Sr., DOB June 25, 1940, MRN [id number]  PCP:  Lenon Layman ORN, MD   Chief Complaint  Patient presents with   3 month follow up     Doing well.     HPI:  Mr. 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 Aortic Insufficiency: S/P AVR / S/P AVR (Bioprosthetic) 2012 CKD- Stage 5 / ESRD / Anemia of chronic disease, on hemodialysis Hyperlipidemia: Statin History of vasovagal syncope related to blood draws EF 50 to 55% Who presents for f/u of his  coronary disease, atrial fibrillation  Last seen by myself in clinic 9/25  Low pressure during HD tu/th/sat Takes midodrine  20 mg before HD Denies orthostasis symptoms  Zio monitor pending Prior Zio with high burden atrial flutter Followed by EP Reports he is tolerating amiodarone , he is not on metoprolol  given low blood pressure -Tolerating Eliquis  2.5 twice daily  Reports he is not monitoring heart rate at home on a regular basis  Previously seen by EP Poor candidate for invasive EP procedures Not a candidate for Tikosyn  Prior results reviewed Zio monitor August 2025 Normal sinus rhythm with paroxysmal atrial flutter 42% of the time longest episode 8 hours 44 minutes  EKG personally reviewed by myself on todays visit EKG Interpretation Date/Time:  Friday December 26 2023 10:05:15 EST Ventricular  Rate:  70 PR Interval:    QRS Duration:  178 QT Interval:  508 QTC Calculation: 548 R Axis:   40  Text Interpretation: NSR with first degree AV block Right bundle branch block When compared with ECG of 03-Dec-2023 10:01, Nonspecific T wave abnormality has replaced inverted T waves in Inferior leads Confirmed by Perla Lye [phone]) on 12/26/2023 10:37:42 AM   Other past medical history reviewed hospital admission from 12/5 to 12/13 records reviewed in detail  rectal bleeding following colonoscopy with polypectomy the day prior  Hospital course complicated by hypotension and by acute onset of unresponsiveness. was nonverbal, not following commands, not responding to noxious stimulation or moving right upper/lower extremities. Code stroke was called. After transport to ICU from radiology patient had garbled speech and was emotion. Head CT and MRI showed no acute intracranial process.    AV graft fistula placed on left arm  Cardiac imaging/testing as detailed below Echocardiogram: 10/14/21 MILD LV DYSFUNCTION (See above) WITH MILD LVH MILD RV SYSTOLIC DYSFUNCTION (See above) VALVULAR REGURGITATION: TRIVIAL AR, MILD MR, MILD PR, MODERATE TR PROSTHETIC VALVE(S): BIOPROSTHETIC AoV EF 45% LA DIAM 4.2 CM  Echo 2/24 Normal left ventricular size with reduced systolic function. LVEF 40%.  Enlarged right ventricular size with reduced function.  Normal function bioprosthetic aortic valve  Mild mitral  regurgitation.   3-Day Holter Monitor: 04/24/21 This 48 hour Holter scan was adequate for interpretation. The patient was in sinus rhythm, sinus bradycardia, with first degree AV block and bundle branch noted throughout (strip 12) Ventricular ectopic activity consisted of multifocal PVCs (strips 6,10), couplets (strip 13), triplet (strip 9) Unable to accurately quantitate supraventricular ectopy due to program setting, however there  were PACs (strip 8), atrial pairs (strip 5), bigeminy (strip  7), trigeminy (strip 11) with runs of SVT. The longest was a 37 beat, 91 BPM atrial run at 4:34:15 AM (2) (strips 15,16). The fastest run was 3 beats, 131 BPM at 4:50:40 PM (2) (strip 14) There were no pauses greater than 2.0 seconds noted There were no symptoms noted in diary nor was the event button activated.   PMH:   has a past medical history of A-fib (HCC), Allergic rhinitis, CAD (coronary artery disease), Chronic kidney disease, and Hypertension.  PSH:    Past Surgical History:  Procedure Laterality Date   A/V FISTULAGRAM Left 02/21/2023   Procedure: A/V Fistulagram;  Surgeon: Jama Cordella MATSU, MD;  Location: [facility] INVASIVE CV LAB;  Service: Cardiovascular;  Laterality: Left;   AORTIC VALVE REPAIR     AV FISTULA PLACEMENT Left 02/2022   BIOPSY  12/11/2022   Procedure: BIOPSY;  Surgeon: Aundria, Ladell POUR, MD;  Location[facility];  Service: Gastroenterology;;   CARDIOVERSION N/A 11/12/2023   Procedure: CARDIOVERSION;  Surgeon: Perla Evalene PARAS, MD;  Location: [facility] ORS;  Service: Cardiovascular;  Laterality: N/A;   COLONOSCOPY N/A 12/13/2022   Procedure: COLONOSCOPY;  Surgeon: Toledo, Ladell POUR, MD;  Location: [facility] ENDOSCOPY;  Service: Gastroenterology;  Laterality: N/A;   COLONOSCOPY WITH PROPOFOL  N/A 12/11/2022   Procedure: COLONOSCOPY WITH PROPOFOL ;  Surgeon: Toledo, Ladell POUR, MD;  Location: [facility] ENDOSCOPY;  Service: Gastroenterology;  Laterality: N/A;   CORONARY ARTERY BYPASS GRAFT     ESOPHAGOGASTRODUODENOSCOPY (EGD) WITH PROPOFOL  N/A 12/11/2022   Procedure: ESOPHAGOGASTRODUODENOSCOPY (EGD) WITH PROPOFOL ;  Surgeon: Toledo, Ladell POUR, MD;  Location: [facility] ENDOSCOPY;  Service: Gastroenterology;  Laterality: N/A;   HEMOSTASIS CLIP PLACEMENT  12/11/2022   Procedure: HEMOSTASIS CLIP PLACEMENT;  Surgeon: Aundria, Ladell POUR, MD;  Location: [facility] ENDOSCOPY;  Service: Gastroenterology;;   POLYPECTOMY  12/11/2022   Procedure: POLYPECTOMY;  Surgeon: Toledo, Teodoro K, MD;  Location: [facility]  ENDOSCOPY;  Service: Gastroenterology;;    Current Outpatient Medications  Medication Sig Dispense Refill   acetaminophen  (TYLENOL ) 500 MG tablet Take 1,000 mg by mouth every 6 (six) hours as needed for moderate pain (pain score 4-6).     amiodarone  (PACERONE ) 200 MG tablet Take 1 tablet (200 mg total) by mouth daily. 90 tablet 3   B Complex-C-Folic Acid  (RENAL VITAMIN) 0.8 MG TABS Take 1 tablet by mouth daily.     diclofenac Sodium (VOLTAREN) 1 % GEL Apply 1 Application topically in the morning and at bedtime.     fluticasone (FLONASE) 50 MCG/ACT nasal spray Place 2 sprays into both nostrils at bedtime.     lidocaine -prilocaine (EMLA) cream Apply 1 Application topically Every Tuesday,Thursday,and Saturday with dialysis.     methylPREDNISolone  (MEDROL  DOSEPAK) 4 MG TBPK tablet Take 4 mg by mouth. Take as directed     metoprolol  tartrate (LOPRESSOR ) 25 MG tablet Take 0.5 tablets (12.5 mg total) by mouth 2 (two) times daily. Hold if SBP <110 mmHg and or HR <60 on NON DIALYSIS DAYS 128 tablet 2   omeprazole (PRILOSEC) 20 MG capsule Take 20 mg by mouth daily.     psyllium (METAMUCIL) 0.52 g capsule Take 0.52 g by mouth daily.     Saw Palmetto 450 MG CAPS Take 450 mg by mouth daily.     tacrolimus (PROTOPIC) 0.1 % ointment Apply 1 Application topically daily as needed (dermatitis).     tamsulosin  (FLOMAX ) 0.4 MG CAPS capsule Take 0.4 mg by mouth daily after supper.  traZODone (DESYREL) 50 MG tablet Take 50 mg by mouth at bedtime.     atorvastatin  (LIPITOR) 20 MG tablet Take 1 tablet (20 mg total) by mouth daily. 90 tablet 3   ELIQUIS  2.5 MG TABS tablet Take 1 tablet (2.5 mg total) by mouth 2 (two) times daily. 180 tablet 3   [START ON 12/27/2023] furosemide  (LASIX ) 40 MG tablet Take 1 tablet (40 mg total) by mouth 4 (four) times a week. On NON DIALYSIS DAYS 64 tablet 3   midodrine  (PROAMATINE ) 10 MG tablet Take 1 tablet (10 mg total) by mouth 3 (three) times daily as needed (low blood pressure at  dialysis). On dialysis days Tues, Thurs,Sat 90 tablet 4   No current facility-administered medications for this visit.   Allergies:   Phenothiazines, Prochlorperazine, Prochlorperazine edisylate, Tape, and Wound dressing adhesive   Social History:  The patient  reports that he has quit smoking. He has never used smokeless tobacco. He reports that he does not drink alcohol and does not use drugs.   Family History:   family history includes Aneurysm in his mother; Heart attack in his brother, father, and sister.   Review of Systems: Review of Systems  Constitutional: Negative.   HENT: Negative.    Respiratory: Negative.    Cardiovascular: Negative.   Gastrointestinal: Negative.   Musculoskeletal: Negative.   Neurological: Negative.   Psychiatric/Behavioral: Negative.    All other systems reviewed and are negative.  PHYSICAL EXAM: VS:  BP 100/60 (BP Location: Right Arm, Patient Position: Sitting, Cuff Size: Normal)   Pulse 70   Ht 5' 9 (1.753 m)   Wt 167 lb 2 oz (75.8 kg)   SpO2 98%   BMI 24.68 kg/m  , BMI Body mass index is 24.68 kg/m. Constitutional:  oriented to person, place, and time. No distress.  HENT:  Head: Normocephalic and atraumatic.  Eyes:  no discharge. No scleral icterus.  Neck: Normal range of motion. Neck supple. No JVD present.  Cardiovascular: Normal rate, regular rhythm, normal heart sounds and intact distal pulses. Exam reveals no gallop and no friction rub. No edema No murmur heard. Pulmonary/Chest: Effort normal and breath sounds normal. No stridor. No respiratory distress.  no wheezes.  no rales.  no tenderness.  Abdominal: Soft.  no distension.  no tenderness.  Musculoskeletal: Normal range of motion.  no  tenderness or deformity.  Neurological:  normal muscle tone. Coordination normal. No atrophy Skin: Skin is warm and dry. No rash noted. not diaphoretic.  Psychiatric:  normal mood and affect. behavior is normal. Thought content normal.   Recent  Labs: 11/05/2023: BUN 46; Creatinine, Ser 4.69; Hemoglobin 10.1; Platelets 128; Potassium 4.2; Sodium 144 12/03/2023: ALT 34; TSH 2.080    Lipid Panel No results found for: CHOL, HDL, LDLCALC, TRIG    Wt Readings from Last 3 Encounters:  12/26/23 167 lb 2 oz (75.8 kg)  12/03/23 166 lb 3.2 oz (75.4 kg)  11/17/23 166 lb 12.8 oz (75.7 kg)    ASSESSMENT AND PLAN:  Problem List Items Addressed This Visit       Cardiology Problems   A-fib [facility])   Relevant Medications   midodrine  (PROAMATINE ) 10 MG tablet   ELIQUIS  2.5 MG TABS tablet   atorvastatin  (LIPITOR) 20 MG tablet   furosemide  (LASIX ) 40 MG tablet (Start on 12/27/2023)   Other Relevant Orders   EKG 12-Lead (Completed)   CAD s/p CABG (coronary artery disease)   Relevant Medications   midodrine  (PROAMATINE ) 10 MG tablet  ELIQUIS  2.5 MG TABS tablet   atorvastatin  (LIPITOR) 20 MG tablet   furosemide  (LASIX ) 40 MG tablet (Start on 12/27/2023)   Other Relevant Orders   EKG 12-Lead (Completed)     Other   CKD (chronic kidney disease) stage 4, 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)   Other Visit Diagnoses       Atrial flutter, unspecified type (HCC)    -  Primary   Relevant Medications   midodrine  (PROAMATINE ) 10 MG tablet   ELIQUIS  2.5 MG TABS tablet   atorvastatin  (LIPITOR) 20 MG tablet   furosemide  (LASIX ) 40 MG tablet (Start on 12/27/2023)   Other Relevant Orders   EKG 12-Lead (Completed)     ESRD (end stage renal disease) (HCC)         Chronic diastolic heart failure (HCC)       Relevant Medications   midodrine  (PROAMATINE ) 10 MG tablet   ELIQUIS  2.5 MG TABS tablet   atorvastatin  (LIPITOR) 20 MG tablet   furosemide  (LASIX ) 40 MG tablet (Start on 12/27/2023)   Other Relevant Orders   EKG 12-Lead (Completed)     S/P AVR (aortic valve replacement)         Aortic valve insufficiency, etiology of cardiac valve disease unspecified       Relevant Medications   midodrine  (PROAMATINE ) 10 MG tablet   ELIQUIS  2.5  MG TABS tablet   atorvastatin  (LIPITOR) 20 MG tablet   furosemide  (LASIX ) 40 MG tablet (Start on 12/27/2023)   Other Relevant Orders   EKG 12-Lead (Completed)     PAF (paroxysmal atrial fibrillation) (HCC)       Relevant Medications   midodrine  (PROAMATINE ) 10 MG tablet   ELIQUIS  2.5 MG TABS tablet   atorvastatin  (LIPITOR) 20 MG tablet   furosemide  (LASIX ) 40 MG tablet (Start on 12/27/2023)       Paroxysmal atrial fibrillation/flutter Prior Zio monitor showing 42% a flutter burden Repeat Zio monitor pending with him on amiodarone  Not taking metoprolol  on a regular basis given low blood pressure, interfering with dialysis, only takes metoprolol  as needed Recommend a monitoring system at home such as Apple Watch, he could take extra amiodarone  as needed for elevated heart rate consistent with atrial fibrillation or flutter  Coronary artery disease with stable angina Currently with no symptoms of angina. No further workup at this time. Continue current medication regimen.  S/p AVR echocardiogram  December 2024 stable valve  SVT/PVCs Currently only taking metoprolol  as needed Continue amiodarone   Hypotension/orthostasis during dialysis Recommend he hold Flomax  night before dialysis Continue midodrine  10 up to 20 mg morning of dialysis Taking metoprolol  as needed  Hyperlipidemia Continue Lipitor 20 Prior cholesterol at goal  End-stage renal disease Has fistula on left,  Participating in hemodialysis Tuesday Thursday Saturday followed by nephrology    Signed, Velinda Lunger, M.D., Ph.D. [facility] [place], [place] [phone]

## 2023-12-26 ENCOUNTER — Encounter: Payer: Self-pay | Admitting: Cardiovascular Disease

## 2023-12-26 ENCOUNTER — Ambulatory Visit: Attending: Cardiovascular Disease | Admitting: Cardiovascular Disease

## 2023-12-26 VITALS — BP 100/60 | HR 70 | Ht 69.0 in | Wt 167.1 lb

## 2023-12-26 DIAGNOSIS — I351 Nonrheumatic aortic (valve) insufficiency: Secondary | ICD-10-CM

## 2023-12-26 DIAGNOSIS — I480 Paroxysmal atrial fibrillation: Secondary | ICD-10-CM

## 2023-12-26 DIAGNOSIS — I25118 Atherosclerotic heart disease of native coronary artery with other forms of angina pectoris: Secondary | ICD-10-CM | POA: Diagnosis not present

## 2023-12-26 DIAGNOSIS — N184 Chronic kidney disease, stage 4 (severe): Secondary | ICD-10-CM

## 2023-12-26 DIAGNOSIS — I4892 Unspecified atrial flutter: Secondary | ICD-10-CM

## 2023-12-26 DIAGNOSIS — I5032 Chronic diastolic (congestive) heart failure: Secondary | ICD-10-CM | POA: Diagnosis not present

## 2023-12-26 DIAGNOSIS — I4819 Other persistent atrial fibrillation: Secondary | ICD-10-CM | POA: Diagnosis not present

## 2023-12-26 DIAGNOSIS — Z952 Presence of prosthetic heart valve: Secondary | ICD-10-CM | POA: Diagnosis not present

## 2023-12-26 DIAGNOSIS — N186 End stage renal disease: Secondary | ICD-10-CM

## 2023-12-26 MED ORDER — FUROSEMIDE 40 MG PO TABS: 40.0000 mg | ORAL_TABLET | ORAL | 3 refills | Status: AC

## 2023-12-26 MED ORDER — ATORVASTATIN CALCIUM 20 MG PO TABS: 20.0000 mg | ORAL_TABLET | Freq: Every day | ORAL | 3 refills | Status: AC

## 2023-12-26 MED ORDER — MIDODRINE HCL 10 MG PO TABS: 10.0000 mg | ORAL_TABLET | Freq: Three times a day (TID) | ORAL | 4 refills | Status: AC | PRN

## 2023-12-26 MED ORDER — ELIQUIS 2.5 MG PO TABS: 2.5000 mg | ORAL_TABLET | Freq: Two times a day (BID) | ORAL | 3 refills | Status: AC

## 2023-12-26 NOTE — Patient Instructions (Signed)
 Medication Instructions:  ?No changes ? ?If you need a refill on your cardiac medications before your next appointment, please call your pharmacy.  ? ?Lab work: ?No new labs needed ? ?Testing/Procedures: ?No new testing needed ? ?Follow-Up: ?At [facility], you and your health needs are our priority.  As part of our continuing mission to provide you with exceptional heart care, we have created designated Provider Care Teams.  These Care Teams include your primary Cardiologist (physician) and Advanced Practice Providers (APPs -  Physician Assistants and Nurse Practitioners) who all work together to provide you with the care you need, when you need it. ? ?You will need a follow up appointment in 6 months, APP ok ? ?Providers on your designated Care Team:   ?Nicolasa Ducking, NP ?Eula Listen, PA-C ?Cadence Fransico Michael, PA-C ? ?COVID-19 Vaccine Information can be found at: [email or web address] For questions related to vaccine distribution or appointments, please email [email or web address] or call [phone].  ? ?

## 2024-01-07 DIAGNOSIS — I4892 Unspecified atrial flutter: Secondary | ICD-10-CM

## 2024-03-08 ENCOUNTER — Ambulatory Visit: Admitting: Cardiology

## 2024-05-17 ENCOUNTER — Encounter (INDEPENDENT_AMBULATORY_CARE_PROVIDER_SITE_OTHER)

## 2024-05-17 ENCOUNTER — Ambulatory Visit (INDEPENDENT_AMBULATORY_CARE_PROVIDER_SITE_OTHER): Admitting: Vascular Surgery
